# Patient Record
Sex: Female | Born: 1960 | Race: White | Hispanic: No | Marital: Married | Smoking: Former smoker
Health system: Southern US, Community
[De-identification: ages and names within clinical notes are randomized; demographics above are authoritative.]

## PROBLEM LIST (undated history)

## (undated) DIAGNOSIS — L501 Idiopathic urticaria: Secondary | ICD-10-CM

## (undated) DIAGNOSIS — T8859XA Other complications of anesthesia, initial encounter: Secondary | ICD-10-CM

## (undated) DIAGNOSIS — Z90710 Acquired absence of both cervix and uterus: Secondary | ICD-10-CM

## (undated) DIAGNOSIS — I2510 Atherosclerotic heart disease of native coronary artery without angina pectoris: Secondary | ICD-10-CM

## (undated) DIAGNOSIS — J449 Chronic obstructive pulmonary disease, unspecified: Secondary | ICD-10-CM

## (undated) DIAGNOSIS — E785 Hyperlipidemia, unspecified: Secondary | ICD-10-CM

## (undated) DIAGNOSIS — Z800 Family history of malignant neoplasm of digestive organs: Secondary | ICD-10-CM

## (undated) DIAGNOSIS — Z9889 Other specified postprocedural states: Secondary | ICD-10-CM

## (undated) DIAGNOSIS — E7800 Pure hypercholesterolemia, unspecified: Secondary | ICD-10-CM

## (undated) DIAGNOSIS — L281 Prurigo nodularis: Secondary | ICD-10-CM

## (undated) DIAGNOSIS — IMO0001 Reserved for inherently not codable concepts without codable children: Secondary | ICD-10-CM

## (undated) DIAGNOSIS — J701 Chronic and other pulmonary manifestations due to radiation: Secondary | ICD-10-CM

## (undated) DIAGNOSIS — C801 Malignant (primary) neoplasm, unspecified: Secondary | ICD-10-CM

## (undated) DIAGNOSIS — M1990 Unspecified osteoarthritis, unspecified site: Secondary | ICD-10-CM

## (undated) DIAGNOSIS — R112 Nausea with vomiting, unspecified: Secondary | ICD-10-CM

## (undated) DIAGNOSIS — Z9851 Tubal ligation status: Secondary | ICD-10-CM

## (undated) DIAGNOSIS — T4145XA Adverse effect of unspecified anesthetic, initial encounter: Secondary | ICD-10-CM

## (undated) DIAGNOSIS — T783XXA Angioneurotic edema, initial encounter: Secondary | ICD-10-CM

## (undated) DIAGNOSIS — K921 Melena: Secondary | ICD-10-CM

## (undated) DIAGNOSIS — Z01810 Encounter for preprocedural cardiovascular examination: Secondary | ICD-10-CM

## (undated) DIAGNOSIS — I25119 Atherosclerotic heart disease of native coronary artery with unspecified angina pectoris: Secondary | ICD-10-CM

## (undated) DIAGNOSIS — Z902 Acquired absence of lung [part of]: Secondary | ICD-10-CM

## (undated) DIAGNOSIS — C3490 Malignant neoplasm of unspecified part of unspecified bronchus or lung: Secondary | ICD-10-CM

## (undated) DIAGNOSIS — J189 Pneumonia, unspecified organism: Secondary | ICD-10-CM

## (undated) DIAGNOSIS — I219 Acute myocardial infarction, unspecified: Secondary | ICD-10-CM

## (undated) HISTORY — PX: TUBAL LIGATION: SHX77

## (undated) HISTORY — PX: MOUTH SURGERY: SHX715

## (undated) HISTORY — DX: Idiopathic urticaria: L50.1

## (undated) HISTORY — DX: Hyperlipidemia, unspecified: E78.5

## (undated) HISTORY — DX: Acquired absence of both cervix and uterus: Z90.710

## (undated) HISTORY — DX: Atherosclerotic heart disease of native coronary artery with unspecified angina pectoris: I25.119

## (undated) HISTORY — DX: Atherosclerotic heart disease of native coronary artery without angina pectoris: I25.10

## (undated) HISTORY — DX: Tubal ligation status: Z98.51

## (undated) HISTORY — DX: Encounter for preprocedural cardiovascular examination: Z01.810

## (undated) HISTORY — DX: Melena: K92.1

## (undated) HISTORY — DX: Chronic and other pulmonary manifestations due to radiation: J70.1

## (undated) HISTORY — DX: Family history of malignant neoplasm of digestive organs: Z80.0

## (undated) HISTORY — PX: CARDIAC CATHETERIZATION: SHX172

## (undated) HISTORY — DX: Angioneurotic edema, initial encounter: T78.3XXA

## (undated) HISTORY — DX: Chronic obstructive pulmonary disease, unspecified: J44.9

## (undated) HISTORY — PX: APPENDECTOMY: SHX54

## (undated) HISTORY — DX: Acquired absence of lung (part of): Z90.2

## (undated) HISTORY — DX: Prurigo nodularis: L28.1

## (undated) HISTORY — DX: Pure hypercholesterolemia, unspecified: E78.00

## (undated) HISTORY — DX: Malignant neoplasm of unspecified part of unspecified bronchus or lung: C34.90

---

## 2008-07-16 DIAGNOSIS — I219 Acute myocardial infarction, unspecified: Secondary | ICD-10-CM

## 2008-07-16 HISTORY — DX: Acute myocardial infarction, unspecified: I21.9

## 2009-04-19 ENCOUNTER — Ambulatory Visit: Payer: Self-pay | Admitting: Cardiology

## 2009-04-19 ENCOUNTER — Inpatient Hospital Stay (HOSPITAL_COMMUNITY): Admission: EM | Admit: 2009-04-19 | Discharge: 2009-04-22 | Payer: Self-pay | Admitting: Emergency Medicine

## 2009-04-20 ENCOUNTER — Encounter: Payer: Self-pay | Admitting: Cardiology

## 2009-04-25 ENCOUNTER — Encounter (INDEPENDENT_AMBULATORY_CARE_PROVIDER_SITE_OTHER): Payer: Self-pay | Admitting: Nurse Practitioner

## 2009-05-17 ENCOUNTER — Ambulatory Visit (HOSPITAL_COMMUNITY): Admission: RE | Admit: 2009-05-17 | Discharge: 2009-05-17 | Payer: Self-pay | Admitting: Cardiovascular Disease

## 2009-05-17 ENCOUNTER — Ambulatory Visit: Payer: Self-pay | Admitting: Cardiovascular Disease

## 2009-05-17 ENCOUNTER — Encounter: Payer: Self-pay | Admitting: Cardiovascular Disease

## 2010-10-19 LAB — DIFFERENTIAL
Basophils Absolute: 0.2 10*3/uL — ABNORMAL HIGH (ref 0.0–0.1)
Lymphs Abs: 3.6 10*3/uL (ref 0.7–4.0)

## 2010-10-19 LAB — CBC
HCT: 36.5 % (ref 36.0–46.0)
HCT: 42.5 % (ref 36.0–46.0)
Hemoglobin: 12.4 g/dL (ref 12.0–15.0)
Hemoglobin: 12.6 g/dL (ref 12.0–15.0)
MCHC: 34.3 g/dL (ref 30.0–36.0)
MCV: 90.7 fL (ref 78.0–100.0)
MCV: 90.7 fL (ref 78.0–100.0)
Platelets: 228 10*3/uL (ref 150–400)
Platelets: 344 10*3/uL (ref 150–400)
RBC: 4.03 MIL/uL (ref 3.87–5.11)
RBC: 4.1 MIL/uL (ref 3.87–5.11)
RBC: 4.11 MIL/uL (ref 3.87–5.11)
RBC: 4.73 MIL/uL (ref 3.87–5.11)
RDW: 12.6 % (ref 11.5–15.5)
RDW: 13.1 % (ref 11.5–15.5)
RDW: 13.1 % (ref 11.5–15.5)
WBC: 10.1 10*3/uL (ref 4.0–10.5)
WBC: 8.6 10*3/uL (ref 4.0–10.5)

## 2010-10-19 LAB — BASIC METABOLIC PANEL
BUN: 12 mg/dL (ref 6–23)
BUN: 16 mg/dL (ref 6–23)
BUN: 8 mg/dL (ref 6–23)
Calcium: 8.5 mg/dL (ref 8.4–10.5)
Calcium: 9 mg/dL (ref 8.4–10.5)
Calcium: 9.1 mg/dL (ref 8.4–10.5)
Chloride: 107 mEq/L (ref 96–112)
Creatinine, Ser: 0.56 mg/dL (ref 0.4–1.2)
Creatinine, Ser: 0.61 mg/dL (ref 0.4–1.2)
GFR calc Af Amer: >60 mL/min (ref >60)
GFR calc non Af Amer: >60 mL/min (ref >60)
Potassium: 4.3 mEq/L (ref 3.5–5.1)
Potassium: 5 mEq/L (ref 3.5–5.1)
Sodium: 139 mEq/L (ref 135–145)
Sodium: 140 mEq/L (ref 135–145)

## 2010-10-19 LAB — TSH: TSH: 1.539 u[IU]/mL (ref 0.350–4.500)

## 2010-10-19 LAB — LIPASE, BLOOD: Lipase: 35 U/L (ref 11–59)

## 2010-10-19 LAB — COMPREHENSIVE METABOLIC PANEL
ALT: 9 U/L (ref 0–35)
AST: 16 U/L (ref 0–37)
AST: 28 U/L (ref 0–37)
Albumin: 3.4 g/dL — ABNORMAL LOW (ref 3.5–5.2)
BUN: 11 mg/dL (ref 6–23)
CO2: 26 mEq/L (ref 19–32)
Calcium: 8.4 mg/dL (ref 8.4–10.5)
Calcium: 9.6 mg/dL (ref 8.4–10.5)
Chloride: 106 mEq/L (ref 96–112)
Creatinine, Ser: 0.47 mg/dL (ref 0.4–1.2)
GFR calc Af Amer: >60 mL/min (ref >60)
GFR calc Af Amer: >60 mL/min (ref >60)
GFR calc non Af Amer: >60 mL/min (ref >60)
GFR calc non Af Amer: >60 mL/min (ref >60)
Glucose, Bld: 152 mg/dL — ABNORMAL HIGH (ref 70–99)
Potassium: 3 mEq/L — ABNORMAL LOW (ref 3.5–5.1)
Potassium: 4 mEq/L (ref 3.5–5.1)
Sodium: 137 mEq/L (ref 135–145)
Sodium: 140 mEq/L (ref 135–145)
Total Bilirubin: 0.7 mg/dL (ref 0.3–1.2)
Total Protein: 5.3 g/dL — ABNORMAL LOW (ref 6.0–8.3)
Total Protein: 6.6 g/dL (ref 6.0–8.3)

## 2010-10-19 LAB — LIPID PANEL
LDL Cholesterol: 131 mg/dL — ABNORMAL HIGH (ref 0–99)
Triglycerides: 116 mg/dL (ref <150)

## 2010-10-19 LAB — CARDIAC PANEL(CRET KIN+CKTOT+MB+TROPI)
CK, MB: 185.8 ng/mL — ABNORMAL HIGH (ref 0.3–4.0)
Relative Index: 15.9 — ABNORMAL HIGH (ref 0.0–2.5)
Total CK: 1918 U/L — ABNORMAL HIGH (ref 7–177)
Total CK: 278 U/L — ABNORMAL HIGH (ref 7–177)
Troponin I: 1.52 ng/mL (ref 0.00–0.06)

## 2010-10-19 LAB — POCT CARDIAC MARKERS
CKMB, poc: 1.8 ng/mL (ref 1.0–8.0)
Myoglobin, poc: 103 ng/mL (ref 12–200)

## 2010-10-19 LAB — PROTIME-INR: Prothrombin Time: 12.5 seconds (ref 11.6–15.2)

## 2013-01-30 ENCOUNTER — Ambulatory Visit: Payer: Self-pay

## 2014-11-15 ENCOUNTER — Encounter: Payer: Self-pay | Admitting: *Deleted

## 2014-11-15 ENCOUNTER — Encounter: Payer: Self-pay | Admitting: Cardiothoracic Surgery

## 2014-11-15 ENCOUNTER — Institutional Professional Consult (permissible substitution) (INDEPENDENT_AMBULATORY_CARE_PROVIDER_SITE_OTHER): Payer: Medicaid Other | Admitting: Cardiothoracic Surgery

## 2014-11-15 VITALS — BP 105/73 | HR 90 | Resp 16 | Ht 62.0 in | Wt 98.0 lb

## 2014-11-15 DIAGNOSIS — C3411 Malignant neoplasm of upper lobe, right bronchus or lung: Secondary | ICD-10-CM

## 2014-11-15 DIAGNOSIS — Z90710 Acquired absence of both cervix and uterus: Secondary | ICD-10-CM | POA: Insufficient documentation

## 2014-11-15 DIAGNOSIS — Z9851 Tubal ligation status: Secondary | ICD-10-CM | POA: Insufficient documentation

## 2014-11-15 DIAGNOSIS — C3431 Malignant neoplasm of lower lobe, right bronchus or lung: Secondary | ICD-10-CM

## 2014-11-15 DIAGNOSIS — I25119 Atherosclerotic heart disease of native coronary artery with unspecified angina pectoris: Secondary | ICD-10-CM | POA: Insufficient documentation

## 2014-11-15 DIAGNOSIS — J449 Chronic obstructive pulmonary disease, unspecified: Secondary | ICD-10-CM | POA: Insufficient documentation

## 2014-11-15 DIAGNOSIS — E785 Hyperlipidemia, unspecified: Secondary | ICD-10-CM | POA: Insufficient documentation

## 2014-11-15 DIAGNOSIS — C3490 Malignant neoplasm of unspecified part of unspecified bronchus or lung: Secondary | ICD-10-CM | POA: Insufficient documentation

## 2014-11-15 HISTORY — DX: Malignant neoplasm of unspecified part of unspecified bronchus or lung: C34.90

## 2014-11-15 NOTE — Progress Notes (Addendum)
[address]       [place] [address]             [phone]                    Maleeya Barnhill [place] Medical Record #[id number] Date of Birth: [birth year]  Referring: Marice Potter, MD Primary Care: Charlotte Sanes, MD  Chief Complaint:    Chief Complaint  Patient presents with  . Lung Cancer    right upper lobe..eval and treat.Marland KitchenMarland KitchenCTA CHEST and  PET @ [place], BX done    History of Present Illness:    Michelle King 54 y.o. 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 Patient notes cough for 6 months, then developed hemptysis about one month ago. CT and PET and MRI of brain done at [facility]. Patient is long term smoker with significant copd. She has history "massive " Mi in 2010 treated with coronary stent at [facility] hospital by Dr Burt Knack. Currently followed by Dr Bettina Gavia.       Current Activity/ Functional Status:  Patient is independent with mobility/ambulation, transfers, ADL's, IADL's.   Zubrod Score: At the time of surgery this patient's most appropriate activity status/level should be described as: '[]'$     0    Normal activity, no symptoms '[x]'$     1    Restricted in physical strenuous activity but ambulatory, able to do out light work '[]'$     2    Ambulatory and capable of self care, unable to do work activities, up and about               >50 % of waking hours                              '[]'$     3    Only limited self care, in bed greater than 50% of waking hours '[]'$     4    Completely disabled, no self care, confined to bed or chair '[]'$     5    Moribund   Past Medical History  Diagnosis Date  . CAD (coronary artery disease)   . COPD (chronic obstructive pulmonary disease)   . Hyperlipidemia   . S/P hysterectomy     Past Surgical History  Procedure Laterality Date  . Appendectomy    . Tubal ligation    . Mouth surgery      Family History  Problem Relation Age of Onset  . Diabetes Mother     . Cancer Father     colon  . Stroke Sister   . Cancer Brother     colon cancer  . Stroke Sister   . Cancer Sister     lung cancer  . Heart disease Brother   . Cerebrovascular Disease Brother   . Heart disease Brother   . Cerebrovascular Disease Brother     History   Social History  . Marital Status: Married    Spouse Name: N/A  . Number of Children: N/A  . Years of Education: N/A   Occupational History  . Not on file.   Social History Main Topics  . Smoking status: Current Every Day Smoker -- 1.00 packs/day for 40 years    Types: Cigarettes  . Smokeless tobacco: Never Used  . Alcohol Use: No  . Drug Use: Not on file  .  Sexual Activity: Not on file      History  Smoking status  . Current Every Day Smoker -- 1.00 packs/day for 40 years  . Types: Cigarettes  Smokeless tobacco  . Never Used    History  Alcohol Use No     Allergies  Allergen Reactions  . Codeine Itching    FACIAL SWELLING    Current Outpatient Prescriptions  Medication Sig Dispense Refill  . albuterol (PROVENTIL HFA;VENTOLIN HFA) 108 (90 BASE) MCG/ACT inhaler Inhale 2 puffs into the lungs every 6 (six) hours as needed for wheezing or shortness of breath.    . Alirocumab 75 MG/ML SOPN Inject 1 mL into the skin.    Marland Kitchen ALPRAZolam (XANAX) 0.5 MG tablet Take 0.5 mg by mouth 3 (three) times daily as needed for anxiety.    Marland Kitchen aspirin EC 81 MG tablet Take 81 mg by mouth daily.    . clopidogrel (PLAVIX) 75 MG tablet Take 75 mg by mouth daily.    . Fluticasone Furoate 200 MCG/ACT AEPB Inhale 200 mcg into the lungs.    . GUAIFENESIN PO Take 400 mg by mouth every 12 (twelve) hours.    Marland Kitchen ipratropium (ATROVENT) 0.02 % nebulizer solution Take 0.5 mg by nebulization 4 (four) times daily.     No current facility-administered medications for this visit.      Review of Systems:     Cardiac Review of Systems: Y or N  Chest Pain [  y  ]  Resting SOB Blue.Reese   ] Exertional SOB  [ y ]  Orthopnea [ y ]   Pedal  Edema [ y  ]    Palpitations Blue.Reese  ] Syncope  [  ]   Presyncope [ y  ]  General Review of Systems: [Y] = yes [  ]=no Constitional: recent weight change [  ];  Wt loss over the last 3 months [   ] anorexia [  ]; fatigue [ y ]; nausea [  ]; night sweats [  ]; fever [  ]; or chills [  ];          Dental: poor dentition[  ]; Last Dentist visit:   Eye : blurred vision [  ]; diplopia [   ]; vision changes [  ];  Amaurosis fugax[  ]; Resp: cough [  ];  wheezing[y  ];  hemoptysis[ y ]; shortness of breath[ yy ]; paroxysmal nocturnal dyspnea[ y ]; dyspnea on exertion[  y]; or orthopnea[  ];  GI:  gallstones[  ], vomiting[  ];  dysphagia[  ]; melena[  ];  hematochezia [  ]; heartburn[  ];   Hx of  Colonoscopy[  ]; GU: kidney stones [  ]; hematuria[  ];   dysuria [  ];  nocturia[  ];  history of     obstruction [  ]; urinary frequency [  ]             Skin: rash, swelling[  ];, hair loss[  ];  peripheral edema[  ];  or itching[  ]; Musculosketetal: myalgias[  ];  joint swelling[  ];  joint erythema[  ];  joint pain[  ];  back pain[  ];  Heme/Lymph: bruising[  ];  bleeding[  ];  anemia[  ];  Neuro: TIA[  ];  headaches[  ];  stroke[  ];  vertigo[  ];  seizures[  ];   paresthesias[  ];  difficulty walking[  ];  Psych:depression[ y ];  anxiety[y  ];  Endocrine: diabetes[ n ];  thyroid dysfunction[n  ];  Immunizations: Flu up to date [?  ]; Pneumococcal up to date [?  ];  Other:  Physical Exam: BP 105/73 mmHg  Pulse 90  Resp 16  Ht '5\' 2"'$  (2.725 m)  Wt 98 lb (44.453 kg)  BMI 17.92 kg/m2  SpO2 95%  PHYSICAL EXAMINATION: General appearance: alert, cooperative, appears older than stated age, cachectic and no distress Head: Normocephalic, without obvious abnormality, atraumatic Neck: no adenopathy, no carotid bruit, no JVD, supple, symmetrical, trachea midline and thyroid not enlarged, symmetric, no tenderness/mass/nodules Lymph nodes: Cervical, supraclavicular, and axillary nodes normal. Resp: clear to  auscultation bilaterally Back: symmetric, no curvature. ROM normal. No CVA tenderness. Cardio: regular rate and rhythm, S1, S2 normal, no murmur, click, rub or gallop GI: soft, non-tender; bowel sounds normal; no masses,  no organomegaly Extremities: extremities normal, atraumatic, no cyanosis or edema Neurologic: Grossly normal pic line in right upper arm  Diagnostic Studies & Laboratory data:     Recent Radiology Findings:  CT AND PET [facility] AND MRI I have independently reviewed the above radiology studies  and reviewed the findings with the patient.  4.1 CM  RIGHT UPPER MASS with contiguous adenopathy  PET scan suggests n2 nodes.   Recent Lab Findings: Lab Results  Component Value Date   WBC 7.9 04/22/2009   HGB 12.8 04/22/2009   HCT 37.2 04/22/2009   PLT 236 04/22/2009   GLUCOSE 97 04/22/2009   CHOL  04/19/2009    190        ATP III CLASSIFICATION:  <200     mg/dL   Desirable  200-239  mg/dL   Borderline High  >=240    mg/dL   High        SLIGHT HEMOLYSIS   TRIG 116 SLIGHT HEMOLYSIS 04/19/2009   HDL 36 SLIGHT HEMOLYSIS* 04/19/2009   LDLCALC * 04/19/2009    131        Total Cholesterol/HDL:CHD Risk Coronary Heart Disease Risk Table                     Men   Women  1/2 Average Risk   3.4   3.3  Average Risk       5.0   4.4  2 X Average Risk   9.6   7.1  3 X Average Risk  23.4   11.0        Use the calculated Patient Ratio above and the CHD Risk Table to determine the patient's CHD Risk.        ATP III CLASSIFICATION (LDL):  <100     mg/dL   Optimal  100-129  mg/dL   Near or Above                    Optimal  130-159  mg/dL   Borderline  160-189  mg/dL   High  >190     mg/dL   Very High   ALT 9 04/19/2009   AST 28 04/19/2009   NA 139 04/22/2009   K 4.3 04/22/2009   CL 103 04/22/2009   CREATININE 0.61 04/22/2009   BUN 16 04/22/2009   CO2 28 04/22/2009   TSH 1.539 Test methodology is 3rd generation TSH 04/19/2009   INR 0.94 04/19/2009       Assessment / Plan:   clinical stage non small cell cancer adeno favored adeno  [facility]ology [id number] clinical T2N2Mo, Stage IIIa  significant copd current smoker- 10 min discussed with patent need and means to stop smoking before considering surgery History of cad with MI and stenting 2010 will need cardiology clearance before consideration of surgical resection  Case has been presented at [facility] multi disciplinary thoracic oncology conference. Discussion supported plan with Dr Bobby Rumpf to proceed with 4 cycles of chemo and then repeat stage and consider surgical resection, Before resection will need assessment of CAD and lung function    I will see back in 8-10 weeks and coordinate with Dr Bobby Rumpf repeat restaging scans and consider surgical resection if findings are favorable and lung function and cardiac risk is low.  I  spent 55 minutes counseling the patient face to face and 50% or more the  time was spent in counseling and coordination of care. The total time spent in the appointment was 80 minutes.  Grace Isaac MD      [address] [place] [address] Office [phone]   [place]  11/15/2014 8:44 PM

## 2014-11-18 ENCOUNTER — Other Ambulatory Visit: Payer: Self-pay | Admitting: *Deleted

## 2015-01-27 ENCOUNTER — Ambulatory Visit: Payer: PRIVATE HEALTH INSURANCE | Admitting: Cardiothoracic Surgery

## 2015-02-17 ENCOUNTER — Ambulatory Visit: Payer: Medicaid Other | Admitting: Cardiothoracic Surgery

## 2015-02-18 ENCOUNTER — Ambulatory Visit: Payer: Medicaid Other | Admitting: Cardiothoracic Surgery

## 2015-02-24 ENCOUNTER — Ambulatory Visit (INDEPENDENT_AMBULATORY_CARE_PROVIDER_SITE_OTHER): Payer: Medicaid Other | Admitting: Cardiothoracic Surgery

## 2015-02-24 ENCOUNTER — Ambulatory Visit: Payer: Medicaid Other | Admitting: Cardiothoracic Surgery

## 2015-02-24 ENCOUNTER — Encounter: Payer: Self-pay | Admitting: Cardiothoracic Surgery

## 2015-02-24 ENCOUNTER — Other Ambulatory Visit: Payer: Self-pay | Admitting: *Deleted

## 2015-02-24 VITALS — BP 121/81 | HR 85 | Resp 20 | Ht 62.0 in | Wt 104.0 lb

## 2015-02-24 DIAGNOSIS — C3411 Malignant neoplasm of upper lobe, right bronchus or lung: Secondary | ICD-10-CM

## 2015-02-24 NOTE — Progress Notes (Signed)
[address]       [place] [address]             [phone]                    [name] [place] Medical Record #[id number] Date of Birth: 09/22/1960  Referring: Marice Potter, MD Primary Care: Charlotte Sanes, MD  Chief Complaint:    Chief Complaint  Patient presents with  . Lung Cancer    10 week f/u, completed chemo (4) cycles,Chest CT 02/10/15     History of Present Illness:    Michelle King 54 y.o. 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 Patient had notedcough for 6 months, then developed hemptysis in April 2016 . CT and PET and MRI of brain done at [facility]. Patient is long term smoker with significant copd. She is still smoking but decreased to 4-5 cig per day.She has history "massive " Mi in 2010 treated with coronary stent at [facility] hospital by Dr Burt Knack. Currently followed by Dr Bettina Gavia.   She has completed course of chem and repeat ct of chest  7/28    Current Activity/ Functional Status:  Patient is independent with mobility/ambulation, transfers, ADL's, IADL's.   Zubrod Score: At the time of surgery this patient's most appropriate activity status/level should be described as: '[]'$     0    Normal activity, no symptoms '[x]'$     1    Restricted in physical strenuous activity but ambulatory, able to do out light work '[]'$     2    Ambulatory and capable of self care, unable to do work activities, up and about               >50 % of waking hours                              '[]'$     3    Only limited self care, in bed greater than 50% of waking hours '[]'$     4    Completely disabled, no self care, confined to bed or chair '[]'$     5    Moribund   Past Medical History  Diagnosis Date  . CAD (coronary artery disease)   . COPD (chronic obstructive pulmonary disease)   . Hyperlipidemia   . S/P hysterectomy      Past Surgical History  Procedure Laterality Date  . Appendectomy    . Tubal ligation    . Mouth surgery      Family History  Problem Relation Age of Onset  . Diabetes Mother   . Cancer Father     colon  . Stroke Sister   . Cancer Brother     colon cancer  . Stroke Sister   . Cancer Sister     lung cancer  . Heart disease Brother   . Cerebrovascular Disease Brother   . Heart disease Brother   . Cerebrovascular Disease Brother     History   Social History  . Marital Status: Married    Spouse Name: N/A  . Number of Children: N/A  . Years of Education: N/A   Occupational History  . Not on file.   Social History Main Topics  . Smoking status: Current  Every Day Smoker -- 1.00 packs/day for 40 years    Types: Cigarettes  . Smokeless tobacco: Never Used  . Alcohol Use: No  . Drug Use: Not on file  . Sexual Activity: Not on file      History  Smoking status  . Current Every Day Smoker -- 1.00 packs/day for 40 years  . Types: Cigarettes  Smokeless tobacco  . Never Used    History  Alcohol Use No     Allergies  Allergen Reactions  . Codeine Itching    FACIAL SWELLING    Current Outpatient Prescriptions  Medication Sig Dispense Refill  . albuterol (PROVENTIL HFA;VENTOLIN HFA) 108 (90 BASE) MCG/ACT inhaler Inhale 2 puffs into the lungs every 6 (six) hours as needed for wheezing or shortness of breath.    . Alirocumab 75 MG/ML SOPN Inject 1 mL into the skin.    Marland Kitchen ALPRAZolam (XANAX) 0.5 MG tablet Take 0.5 mg by mouth 3 (three) times daily as needed for anxiety.    Marland Kitchen aspirin EC 81 MG tablet Take 81 mg by mouth daily.    . clopidogrel (PLAVIX) 75 MG tablet Take 75 mg by mouth daily.    . Fluticasone Furoate 200 MCG/ACT AEPB Inhale 200 mcg into the lungs.    . GUAIFENESIN PO Take 400 mg by mouth every 12 (twelve) hours.    Marland Kitchen ipratropium (ATROVENT) 0.02 % nebulizer solution Take 0.5 mg by nebulization 4 (four) times daily.     No current  facility-administered medications for this visit.      Review of Systems:     Cardiac Review of Systems: Y or N  Chest Pain [  y  ]  Resting SOB Blue.Reese   ] Exertional SOB  [ y ]  Orthopnea [ y ]   Pedal Edema [ y  ]    Palpitations Blue.Reese  ] Syncope  [  ]   Presyncope [ y  ]  General Review of Systems: [Y] = yes [  ]=no Constitional: recent weight change [  ];  Wt loss over the last 3 months [   ] anorexia [  ]; fatigue [ y ]; nausea [  ]; night sweats [  ]; fever [  ]; or chills [  ];          Dental: poor dentition[  ]; Last Dentist visit:   Eye : blurred vision [  ]; diplopia [   ]; vision changes [  ];  Amaurosis fugax[  ]; Resp: cough [  ];  wheezing[y  ];  hemoptysis[ y ]; shortness of breath[ yy ]; paroxysmal nocturnal dyspnea[ y ]; dyspnea on exertion[  y]; or orthopnea[  ];  GI:  gallstones[  ], vomiting[  ];  dysphagia[  ]; melena[  ];  hematochezia [  ]; heartburn[  ];   Hx of  Colonoscopy[  ]; GU: kidney stones [  ]; hematuria[  ];   dysuria [  ];  nocturia[  ];  history of     obstruction [  ]; urinary frequency [  ]             Skin: rash, swelling[  ];, hair loss[  ];  peripheral edema[  ];  or itching[  ]; Musculosketetal: myalgias[  ];  joint swelling[  ];  joint erythema[  ];  joint pain[  ];  back pain[  ];  Heme/Lymph: bruising[  ];  bleeding[  ];  anemia[  ];  Neuro: TIA[  ];  headaches[  ];  stroke[  ];  vertigo[  ];  seizures[  ];   paresthesias[  ];  difficulty walking[  ];  Psych:depression[ y ]; Michelle King  ];  Endocrine: diabetes[ n ];  thyroid dysfunction[n  ];  Immunizations: Flu up to date [?  ]; Pneumococcal up to date [?  ];  Other:  Physical Exam: BP 121/81 mmHg  Pulse 85  Resp 20  Ht '5\' 2"'$  (1.575 m)  Wt 104 lb (47.174 kg)  BMI 19.02 kg/m2  SpO2 97%  PHYSICAL EXAMINATION: General appearance: alert, cooperative, appears older than stated age, cachectic and no distress Head: Normocephalic, without obvious abnormality, atraumatic Neck: no adenopathy, no  carotid bruit, no JVD, supple, symmetrical, trachea midline and thyroid not enlarged, symmetric, no tenderness/mass/nodules Lymph nodes: Cervical, supraclavicular, and axillary nodes normal. Resp: clear to auscultation bilaterally Back: symmetric, no curvature. ROM normal. No CVA tenderness. Cardio: regular rate and rhythm, S1, S2 normal, no murmur, click, rub or gallop GI: soft, non-tender; bowel sounds normal; no masses,  no organomegaly Extremities: extremities normal, atraumatic, no cyanosis or edema Neurologic: Grossly normal   Diagnostic Studies & Laboratory data:     Recent Radiology Findings:  CLINICAL DATA: Malignant neoplasm of the right lung.  EXAM: CT CHEST WITH CONTRAST  TECHNIQUE: Multidetector CT imaging of the chest was performed during intravenous contrast administration.  CONTRAST: 80 cc of Isovue 370  COMPARISON: 10/16/2014  FINDINGS: Mediastinum: The heart size appears normal. No pericardial effusion. Aortic atherosclerosis. There is no enlarged mediastinal or hilar lymph nodes. The trachea appears patent and is midline. Normal appearance of the esophagus. No supraclavicular or axillary adenopathy identified.  Lungs/Pleura: No pleural effusion identified. Moderate changes of centrilobular emphysema. Right upper lobe lung mass measures 3.1 cm, image 20/series 4. Previously 4.7 cm. No new or enlarging pulmonary nodules or masses.  Upper Abdomen: No suspicious liver abnormality. The visualized portions of the spleen are unremarkable. The adrenal glands are both normal. The visualized portions of the kidneys and pancreas are also normal.  Musculoskeletal: Review of the visualized bony structures is negative for aggressive lytic or sclerotic bone lesion.  IMPRESSION: 1. Interval decrease in size of right lung mass. 2. Emphysema 3. Aortic atherosclerosis.   Electronically Signed By: Kerby Moors M.D. On: 02/10/2015 09:47  I have independently  reviewed the above radiology studies  and reviewed the findings with the patient.     CT AND PET [facility] AND MRI   4.1 CM  RIGHT UPPER MASS with contiguous adenopathy  PET scan suggests n2 nodes.   Recent Lab Findings: Lab Results  Component Value Date   WBC 7.9 04/22/2009   HGB 12.8 04/22/2009   HCT 37.2 04/22/2009   PLT 236 04/22/2009   GLUCOSE 97 04/22/2009   CHOL  04/19/2009    190        ATP III CLASSIFICATION:  <200     mg/dL   Desirable  200-239  mg/dL   Borderline High  >=240    mg/dL   High        SLIGHT HEMOLYSIS   TRIG 116 SLIGHT HEMOLYSIS 04/19/2009   HDL 36 SLIGHT HEMOLYSIS* 04/19/2009   LDLCALC * 04/19/2009    131        Total Cholesterol/HDL:CHD Risk Coronary Heart Disease Risk Table                     Men   Women  1/2 Average Risk  3.4   3.3  Average Risk       5.0   4.4  2 X Average Risk   9.6   7.1  3 X Average Risk  23.4   11.0        Use the calculated Patient Ratio above and the CHD Risk Table to determine the patient's CHD Risk.        ATP III CLASSIFICATION (LDL):  <100     mg/dL   Optimal  100-129  mg/dL   Near or Above                    Optimal  130-159  mg/dL   Borderline  160-189  mg/dL   High  >190     mg/dL   Very High   ALT 9 04/19/2009   AST 28 04/19/2009   NA 139 04/22/2009   K 4.3 04/22/2009   CL 103 04/22/2009   CREATININE 0.61 04/22/2009   BUN 16 04/22/2009   CO2 28 04/22/2009   TSH 1.539 Test methodology is 3rd generation TSH 04/19/2009   INR 0.94 04/19/2009      Assessment / Plan:   clinical stage IIIa (or poss Stage II) non small cell cancer adeno favored adeno  [place] [facility] [phone] clinical T2N2Mo, Ct shows decreased size of mass significant copd current smoker- PFT pending History of cad with MI and stenting 2010 will need cardiology clearance before consideration of surgical resection Will need PFT to evaluate  underlying pulmonary disease  Will see back after pft and cardiology evaluation and  make final decision about resection     Grace Isaac MD      [address] [place] [address] Office [phone]   [place] [phone]  02/24/2015 9:28 AM

## 2015-03-01 DIAGNOSIS — J449 Chronic obstructive pulmonary disease, unspecified: Secondary | ICD-10-CM

## 2015-03-01 DIAGNOSIS — J4489 Other specified chronic obstructive pulmonary disease: Secondary | ICD-10-CM

## 2015-03-01 DIAGNOSIS — E7800 Pure hypercholesterolemia, unspecified: Secondary | ICD-10-CM

## 2015-03-01 HISTORY — DX: Pure hypercholesterolemia, unspecified: E78.00

## 2015-03-01 HISTORY — DX: Other specified chronic obstructive pulmonary disease: J44.89

## 2015-03-01 HISTORY — DX: Chronic obstructive pulmonary disease, unspecified: J44.9

## 2015-03-02 ENCOUNTER — Ambulatory Visit (HOSPITAL_COMMUNITY)
Admission: RE | Admit: 2015-03-02 | Discharge: 2015-03-02 | Disposition: A | Discharge status: Home / Self Care | Payer: Medicaid Other | Source: Ambulatory Visit | Attending: Cardiothoracic Surgery | Admitting: Cardiothoracic Surgery

## 2015-03-02 DIAGNOSIS — C3411 Malignant neoplasm of upper lobe, right bronchus or lung: Secondary | ICD-10-CM

## 2015-03-02 DIAGNOSIS — F17210 Nicotine dependence, cigarettes, uncomplicated: Secondary | ICD-10-CM | POA: Diagnosis not present

## 2015-03-02 DIAGNOSIS — Z01810 Encounter for preprocedural cardiovascular examination: Secondary | ICD-10-CM

## 2015-03-02 HISTORY — DX: Encounter for preprocedural cardiovascular examination: Z01.810

## 2015-03-02 LAB — PULMONARY FUNCTION TEST
DL/VA % pred: 80 %
DL/VA: 3.43 ml/min/mmHg/L
DLCO unc % pred: 74 %
DLCO unc: 14.13 ml/min/mmHg
FEF 25-75 Post: 1.43 L/sec
FEF 25-75 Pre: 1.68 L/sec
FEF2575-%Change-Post: -15 %
FEF2575-%Pred-Post: 75 %
FEF2575-%Pred-Pre: 89 %
FEV1-%Change-Post: -5 %
FEV1-%Pred-Post: 94 %
FEV1-%Pred-Pre: 100 %
FEV1-Post: 1.92 L
FEV1-Pre: 2.04 L
FEV1FVC-%Change-Post: 0 %
FEV1FVC-%Pred-Pre: 96 %
FEV6-%Change-Post: -6 %
FEV6-%Pred-Post: 99 %
FEV6-%Pred-Pre: 106 %
FEV6-Post: 2.55 L
FEV6-Pre: 2.73 L
FEV6FVC-%Pred-Post: 104 %
FEV6FVC-%Pred-Pre: 104 %
FVC-%Change-Post: -6 %
FVC-%Pred-Post: 95 %
FVC-%Pred-Pre: 102 %
FVC-Post: 2.55 L
Post FEV1/FVC ratio: 75 %
Post FEV6/FVC ratio: 100 %
Pre FEV1/FVC ratio: 75 %
Pre FEV6/FVC Ratio: 100 %
RV % pred: 98 %
RV: 1.87 L
TLC % pred: 99 %
TLC: 4.42 L

## 2015-03-02 MED ORDER — ALBUTEROL SULFATE (2.5 MG/3ML) 0.083% IN NEBU
2.5000 mg | INHALATION_SOLUTION | Freq: Once | RESPIRATORY_TRACT | Status: AC
  Administered 2015-03-02: 2.5 mg via RESPIRATORY_TRACT

## 2015-03-09 ENCOUNTER — Encounter: Payer: Self-pay | Admitting: Cardiothoracic Surgery

## 2015-03-09 ENCOUNTER — Encounter: Payer: Self-pay | Admitting: *Deleted

## 2015-03-09 ENCOUNTER — Ambulatory Visit (INDEPENDENT_AMBULATORY_CARE_PROVIDER_SITE_OTHER): Payer: Medicaid Other | Admitting: Cardiothoracic Surgery

## 2015-03-09 VITALS — BP 111/76 | HR 92 | Resp 20 | Ht 62.0 in | Wt 104.0 lb

## 2015-03-09 DIAGNOSIS — C3411 Malignant neoplasm of upper lobe, right bronchus or lung: Secondary | ICD-10-CM

## 2015-03-09 NOTE — Patient Instructions (Signed)
Lung Resection A lung resection is a procedure to remove part or all of a lung. When an entire lung is removed, the procedure is called a pneumonectomy. When only part of a lung is removed, the procedure is called a lobectomy. A lung resection is typically done to get rid of a tumor or cancer, but it may be done to treat other conditions. This procedure can help relieve some or all of your symptoms and can also help keep the problem from getting worse. Lung resection may provide the best chance for curing your disease. However, the procedure may not necessarily cure lung cancer if that is the problem.  LET YOUR HEALTH CARE PROVIDER KNOW ABOUT:   Any allergies you have.  All medicines you are taking, including vitamins, herbs, eye drops, creams, and over-the-counter medicines.  Previous problems you or members of your family have had with the use of anesthetics.  Any blood disorders you have.  Previous surgeries you have had.  Medical conditions you have. RISKS AND COMPLICATIONS  Generally, lung resection is a safe procedure. However, problems can occur and include:  Excessive bleeding.  Infection.  Inability to breathe without a ventilator.  Persistent shortness of breath.  Heart problems, including abnormal rhythms and a risk of heart attack or heart failure.  Blood clots.  Injury to a blood vessel.  Injury to a nerve.  Failure to heal properly.  Stroke.  Bronchopleural fistula. This is a small hole between one of the main breathing tubes (bronchus) and the lining of the lungs. This is rare.  Reaction to anesthesia. BEFORE THE PROCEDURE  You may have tests done before the procedure, including:  Blood tests.  Urine tests.  X-rays.  Other imaging tests (such as CT scans, MRI scans, and PET scans). These tests are done to find the exact size and location of the condition being treated with this surgery.  Pulmonary function tests. These are breathing tests to assess  the function of your lungs before surgery and to decide how to best help your breathing after surgery.  Heart testing. This is done to make sure your heart is strong enough for the procedure.  Bronchoscopy. This is a technique that allows your health care provider to look at the inside of your airways. This is done using a soft, flexible tube (bronchoscope). Along with imaging tests, this can help your health care provider know the exact location and size of the area that will be removed during surgery.  Lymph node sampling. This may need to be done to see if the tumor has spread. It may be done as a separate surgery or right before your lung resection procedure. PROCEDURE  An IV tube will be placed in your arm. You will be given a medicine that makes you fall asleep (general anesthetic). You may also get pain medicine through a thin, flexible tube (catheter) in your back.  A breathing tube will be placed in your throat.  Once the surgical team has prepared you for surgery, your surgeon will make an incision on your side. Some resections are done through large incisions, while others can be done through small incisions using smaller instruments and assisted with small cameras (laparoscopic surgery).  Your surgeon will carefully cut the veins, arteries, and bronchus leading to your lung. After being cut, each of these pieces will be sewn or stapled closed. The lung or part of the lung will then be removed.  Your surgeon will check inside your chest to make   sure there is no bleeding in or around the lungs. Lymph nodes near the lung may also be removed for later tests.  Your surgeon may put tubes into your chest to drain extra fluid and air after surgery.  Your incision will be closed. This may be done using:  Stitches that absorb into your body and do not need to be removed.  Stitches that must be removed.  Staples that must be removed. AFTER THE PROCEDURE   You will be taken to the  recovery area and your progress will be monitored. You may still have a breathing tube and other tubes or catheters in your body immediately after surgery. These will be removed during your recovery. You may be put on a respirator following surgery if some assistance is needed to help your breathing. When you are awake and not experiencing immediate problems from surgery, you will be moved to the intensive care unit (ICU) where you will continue your recovery.  You may feel pain in your chest and throat. Sometimes during recovery, patients may shiver or feel nauseous. You will be given medicine to help with pain and nausea.  The breathing tube will be taken out as soon as your health care providers feel you can breathe on your own. For most people, this happens on the same day as the surgery.  If your surgery and time in the ICU go well, most of the tubes and equipment will be taken out within 1-2 days after surgery. This is about how long most people stay in the ICU. You may need to stay longer, depending on how you are doing.  You should also start respiratory therapy in the ICU. This therapy uses breathing exercises to help your other lung stay healthy and get stronger.  As you improve, you will be moved to a regular hospital room for continued respiratory therapy, help with your bladder and bowels, and to continue medicines.  After your lung or part of your lung is taken out, there will be a space inside your chest. This space will often fill up with fluid over time. The amount of time this takes is different for each person.  You will receive care until you are doing well and your health care provider feels it is safe for you to go home or to transfer to an extended care facility. Document Released: 09/22/2002 Document Revised: 11/16/2013 Document Reviewed: 08/21/2013 ExitCare Patient Information 2015 [employer]. This information is not intended to replace advice given to you by your health  care provider. Make sure you discuss any questions you have with your health care provider.Lung Resection, Care After Refer to this sheet in the next few weeks. These instructions provide you with information on caring for yourself after your procedure. Your health care provider may also give you more specific instructions. Your treatment has been planned according to current medical practices, but problems sometimes occur. Call your health care provider if you have any problems or questions after your procedure. WHAT TO EXPECT AFTER THE PROCEDURE After your procedure, it is typical to have the following:   You may feel pain in your chest and throat.  Patients may sometimes shiver or feel nauseous during recovery. HOME CARE INSTRUCTIONS  You may resume a normal diet and activities as directed by your health care provider.  Do not use any tobacco products, including cigarettes, chewing tobacco, or electronic cigarettes. If you need help quitting, ask your health care provider.  There are many different ways   to close and cover an incision, including stitches, skin glue, and adhesive strips. Follow your health care provider's instructions on:  Incision care.  Bandage (dressing) changes and removal.  Incision closure removal.  Take medicines only as directed by your health care provider.  Keep all follow-up visits as directed by your health care provider. This is important.  Try to breathe deeply and cough as directed. Holding a pillow firmly over your ribs may help with discomfort.  If you were given an incentive spirometer in the hospital, continue to use it as directed by your health care provider.  Walk as directed by your health care provider.  You may take a shower and gently wash the area of your incision with water and soap as directed by your health care provider. Do not use anything else to clean your incision except as directed by your health care provider. Do not take baths,  swim, or use a hot tub until your health care provider approves. SEEK MEDICAL CARE IF:  You notice redness, swelling, or increasing pain at the incision site.  You are bleeding at the incision site.  You see pus coming from the incision site.  You notice a bad smell coming from the incision site or bandage.  Your incision breaks open.  You cough up blood or pus, or you develop a cough that produces bad-smelling sputum.  You have pain or swelling in your legs.  You have increasing pain that is not controlled with medicine.  You have trouble managing any of the tubes that have been left in place after surgery.  You have fever or chills. SEEK IMMEDIATE MEDICAL CARE IF:   You have chest pain or an irregular or rapid heartbeat.  You have dizzy episodes or faint.  You have shortness of breath or difficulty breathing.  You have persistent nausea or vomiting.  You have a rash. Document Released: 01/19/2005 Document Revised: 11/16/2013 Document Reviewed: 08/21/2013 ExitCare Patient Information 2015 [employer]. This information is not intended to replace advice given to you by your health care provider. Make sure you discuss any questions you have with your health care provider.  

## 2015-03-10 ENCOUNTER — Other Ambulatory Visit: Payer: Self-pay | Admitting: *Deleted

## 2015-03-10 DIAGNOSIS — C3401 Malignant neoplasm of right main bronchus: Secondary | ICD-10-CM

## 2015-03-10 NOTE — Progress Notes (Signed)
[address]       [place] [address]             [phone]                    Michelle King [place] Medical Record #[id number] Date of Birth: [birth year]  Referring: Marice Potter, MD Primary Care: Charlotte Sanes, MD  Chief Complaint:    Chief Complaint  Patient presents with  . Lung Cancer    further discuss surgery, Cardiac Clearance- Dr Bettina Gavia, PFT's 03/02/15    History of Present Illness:    Michelle King 54 y.o. 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 Patient had notedcough for 6 months, then developed hemptysis in April 2016 . CT and PET and MRI of brain done at [facility]. Patient is long term smoker with significant copd. She is still smoking but decreased to 4-5 cig per day.She has history "massive " Mi in 2010 treated with coronary stent at [facility] hospital by Dr Burt Knack. Currently followed by Dr Bettina Gavia. She has recently seen Dr. Bettina Gavia and had a repeat echocardiogram done. Dr. Bettina Gavia has sent the associated notes for his visit.  She has completed course of chem and repeat ct of chest  7/28    Current Activity/ Functional Status:  Patient is independent with mobility/ambulation, transfers, ADL's, IADL's.   Zubrod Score: At the time of surgery this patient's most appropriate activity status/level should be described as: '[]'$     0    Normal activity, no symptoms '[x]'$     1    Restricted in physical strenuous activity but ambulatory, able to do out light work '[]'$     2    Ambulatory and capable of self care, unable to do work activities, up and about               >50 % of waking hours                              '[]'$     3    Only limited self care, in bed greater than 50% of waking hours '[]'$     4    Completely disabled, no self care, confined to bed or chair '[]'$     5    Moribund   Past Medical History    Diagnosis Date  . CAD (coronary artery disease)   . COPD (chronic obstructive pulmonary disease)   . Hyperlipidemia   . S/P hysterectomy     Past Surgical History  Procedure Laterality Date  . Appendectomy    . Tubal ligation    . Mouth surgery      Family History  Problem Relation Age of Onset  . Diabetes Mother   . Cancer Father     colon  . Stroke Sister   . Cancer Brother     colon cancer  . Stroke Sister   . Cancer Sister     lung cancer  . Heart disease Brother   . Cerebrovascular Disease Brother   . Heart disease Brother   . Cerebrovascular Disease Brother     History   Social History  . Marital Status: Married    Spouse Name: N/A  . Number of Children: N/A  . Years of Education:  N/A   Occupational History  . Not on file.   Social History Main Topics  . Smoking status: Current Every Day Smoker -- 1.00 packs/day for 40 years    Types: Cigarettes  . Smokeless tobacco: Never Used  . Alcohol Use: No  . Drug Use: Not on file  . Sexual Activity: Not on file      History  Smoking status  . Current Every Day Smoker -- 1.00 packs/day for 40 years  . Types: Cigarettes  Smokeless tobacco  . Never Used    History  Alcohol Use No     Allergies  Allergen Reactions  . Codeine Itching    FACIAL SWELLING  . Rosuvastatin   . Sympathomimetics     Current Outpatient Prescriptions  Medication Sig Dispense Refill  . albuterol (PROVENTIL HFA;VENTOLIN HFA) 108 (90 BASE) MCG/ACT inhaler Inhale 2 puffs into the lungs every 6 (six) hours as needed for wheezing or shortness of breath.    . Alirocumab 75 MG/ML SOPN Inject 1 mL into the skin.    Marland Kitchen ALPRAZolam (XANAX) 0.5 MG tablet Take 0.5 mg by mouth 3 (three) times daily as needed for anxiety.    Marland Kitchen aspirin EC 81 MG tablet Take 81 mg by mouth daily.    . clopidogrel (PLAVIX) 75 MG tablet Take 75 mg by mouth daily.    . Fluticasone Furoate 200 MCG/ACT AEPB Inhale 200 mcg into the lungs.    . GUAIFENESIN PO  Take 400 mg by mouth every 12 (twelve) hours.    Marland Kitchen ipratropium (ATROVENT) 0.02 % nebulizer solution Take 0.5 mg by nebulization 4 (four) times daily.     No current facility-administered medications for this visit.      Review of Systems:     Cardiac Review of Systems: Y or N  Chest Pain [  y  ]  Resting SOB Blue.Reese   ] Exertional SOB  [ y ]  Orthopnea [ y ]   Pedal Edema [ y  ]    Palpitations Blue.Reese  ] Syncope  [  ]   Presyncope [ y  ]  General Review of Systems: [Y] = yes [  ]=no Constitional: recent weight change [  ];  Wt loss over the last 3 months [   ] anorexia [  ]; fatigue [ y ]; nausea [  ]; night sweats [  ]; fever [  ]; or chills [  ];          Dental: poor dentition[  ]; Last Dentist visit:   Eye : blurred vision [  ]; diplopia [   ]; vision changes [  ];  Amaurosis fugax[  ]; Resp: cough [  ];  wheezing[y  ];  hemoptysis[ y ]; shortness of breath[ yy ]; paroxysmal nocturnal dyspnea[ y ]; dyspnea on exertion[  y]; or orthopnea[  ];  GI:  gallstones[  ], vomiting[  ];  dysphagia[  ]; melena[  ];  hematochezia [  ]; heartburn[  ];   Hx of  Colonoscopy[  ]; GU: kidney stones [  ]; hematuria[  ];   dysuria [  ];  nocturia[  ];  history of     obstruction [  ]; urinary frequency [  ]             Skin: rash, swelling[  ];, hair loss[  ];  peripheral edema[  ];  or itching[  ]; Musculosketetal: myalgias[  ];  joint swelling[  ];  joint erythema[  ];  joint pain[  ];  back pain[  ];  Heme/Lymph: bruising[  ];  bleeding[  ];  anemia[  ];  Neuro: TIA[  ];  headaches[  ];  stroke[  ];  vertigo[  ];  seizures[  ];   paresthesias[  ];  difficulty walking[  ];  Psych:depression[ y ]; [name]  ];  Endocrine: diabetes[ n ];  thyroid dysfunction[n  ];  Immunizations: Flu up to date [?  ]; Pneumococcal up to date [?  ];  Other:  Physical Exam: BP 111/76 mmHg  Pulse 92  Resp 20  Ht '5\' 2"'$  (1.575 m)  Wt 104 lb (47.174 kg)  BMI 19.02 kg/m2  SpO2 98%  PHYSICAL EXAMINATION: General appearance:  alert, cooperative, appears older than stated age, cachectic and no distress Head: Normocephalic, without obvious abnormality, atraumatic Neck: no adenopathy, no carotid bruit, no JVD, supple, symmetrical, trachea midline and thyroid not enlarged, symmetric, no tenderness/mass/nodules Lymph nodes: Cervical, supraclavicular, and axillary nodes normal. Resp: clear to auscultation bilaterally Back: symmetric, no curvature. ROM normal. No CVA tenderness. Cardio: regular rate and rhythm, S1, S2 normal, no murmur, click, rub or gallop GI: soft, non-tender; bowel sounds normal; no masses,  no organomegaly Extremities: extremities normal, atraumatic, no cyanosis or edema Neurologic: Grossly normal   Diagnostic Studies & Laboratory data:     Recent Radiology Findings:  CLINICAL DATA: Malignant neoplasm of the right lung.  EXAM: CT CHEST WITH CONTRAST  TECHNIQUE: Multidetector CT imaging of the chest was performed during intravenous contrast administration.  CONTRAST: 80 cc of Isovue 370  COMPARISON: 10/16/2014  FINDINGS: Mediastinum: The heart size appears normal. No pericardial effusion. Aortic atherosclerosis. There is no enlarged mediastinal or hilar lymph nodes. The trachea appears patent and is midline. Normal appearance of the esophagus. No supraclavicular or axillary adenopathy identified.  Lungs/Pleura: No pleural effusion identified. Moderate changes of centrilobular emphysema. Right upper lobe lung mass measures 3.1 cm, image 20/series 4. Previously 4.7 cm. No new or enlarging pulmonary nodules or masses.  Upper Abdomen: No suspicious liver abnormality. The visualized portions of the spleen are unremarkable. The adrenal glands are both normal. The visualized portions of the kidneys and pancreas are also normal.  Musculoskeletal: Review of the visualized bony structures is negative for aggressive lytic or sclerotic bone lesion.  IMPRESSION: 1. Interval decrease in  size of right lung mass. 2. Emphysema 3. Aortic atherosclerosis.   Electronically Signed By: Kerby Moors M.D. On: 02/10/2015 09:47  I have independently reviewed the above radiology studies  and reviewed the findings with the patient.     CT AND PET [place] AND MRI   4.1 CM  RIGHT UPPER MASS with contiguous adenopathy  PET scan suggests n2 nodes.   Recent Lab Findings: Lab Results  Component Value Date   WBC 7.9 04/22/2009   HGB 12.8 04/22/2009   HCT 37.2 04/22/2009   PLT 236 04/22/2009   GLUCOSE 97 04/22/2009   CHOL  04/19/2009    190        ATP III CLASSIFICATION:  <200     mg/dL   Desirable  200-239  mg/dL   Borderline High  >=240    mg/dL   High        SLIGHT HEMOLYSIS   TRIG 116 SLIGHT HEMOLYSIS 04/19/2009   HDL 36 SLIGHT HEMOLYSIS* 04/19/2009   LDLCALC * 04/19/2009    131        Total Cholesterol/HDL:CHD Risk Coronary Heart Disease Risk Table  Men   Women  1/2 Average Risk   3.4   3.3  Average Risk       5.0   4.4  2 X Average Risk   9.6   7.1  3 X Average Risk  23.4   11.0        Use the calculated Patient Ratio above and the CHD Risk Table to determine the patient's CHD Risk.        ATP III CLASSIFICATION (LDL):  <100     mg/dL   Optimal  100-129  mg/dL   Near or Above                    Optimal  130-159  mg/dL   Borderline  160-189  mg/dL   High  >190     mg/dL   Very High   ALT 9 04/19/2009   AST 28 04/19/2009   NA 139 04/22/2009   K 4.3 04/22/2009   CL 103 04/22/2009   CREATININE 0.61 04/22/2009   BUN 16 04/22/2009   CO2 28 04/22/2009   TSH 1.539 Test methodology is 3rd generation TSH 04/19/2009   INR 0.94 04/19/2009   PFT's Pulmonary Function Diagnosis: Minimal Obstructive Airways Disease Insignificant response to bronchodilator Minimal Diffusion Defect FEV1 2.04 100%   DLCO  14.13 74 %   Assessment / Plan:   clinical stage IIIa (or poss Stage II) non small cell cancer adeno favored adeno  [place]  [facility] [phone] clinical T2N2Mo, Ct shows decreased size of mass significant copd current smoker- PFT complete History of cad with MI and stenting 2010 will need cardiology clearance before consideration of surgical resection Cleared by cardiology- Dr Bettina Gavia for surgery  I discussed with the patient and her husband in detail proceeding with surgical resection of her right upper lobe lung cancer, she has had significant decrease in size of the primary mass in addition the associated adenopathy. The patient understands that she does have advanced stage lung cancer but with her young age and without evidence of distant metastasis we will proceed with resection. Risks and options were discussed with her and her husband in detail. Dr. Bettina Gavia has right left recommendations about stopping her Plavix prior to surgery, and has done a updated echocardiogram. Potentially planned surgery September 9.     Grace Isaac MD      [address] [place] [address] Office [phone]   Beeper [phone]  03/10/2015 5:39 PM

## 2015-03-23 ENCOUNTER — Encounter (HOSPITAL_COMMUNITY)
Admission: RE | Admit: 2015-03-23 | Discharge: 2015-03-23 | Disposition: A | Discharge status: Home / Self Care | Payer: Medicaid Other | Source: Ambulatory Visit | Attending: Cardiothoracic Surgery | Admitting: Cardiothoracic Surgery

## 2015-03-23 ENCOUNTER — Encounter: Payer: Self-pay | Admitting: Anatomic Pathology & Clinical Pathology

## 2015-03-23 ENCOUNTER — Encounter (HOSPITAL_COMMUNITY): Payer: Self-pay

## 2015-03-23 VITALS — BP 108/73 | HR 88 | Temp 99.2°F | Resp 20 | Ht 62.0 in | Wt 102.3 lb

## 2015-03-23 DIAGNOSIS — C3491 Malignant neoplasm of unspecified part of right bronchus or lung: Secondary | ICD-10-CM | POA: Insufficient documentation

## 2015-03-23 DIAGNOSIS — Z7902 Long term (current) use of antithrombotics/antiplatelets: Secondary | ICD-10-CM | POA: Insufficient documentation

## 2015-03-23 DIAGNOSIS — J449 Chronic obstructive pulmonary disease, unspecified: Secondary | ICD-10-CM | POA: Insufficient documentation

## 2015-03-23 DIAGNOSIS — Z0183 Encounter for blood typing: Secondary | ICD-10-CM | POA: Insufficient documentation

## 2015-03-23 DIAGNOSIS — Z01818 Encounter for other preprocedural examination: Secondary | ICD-10-CM | POA: Diagnosis present

## 2015-03-23 DIAGNOSIS — E785 Hyperlipidemia, unspecified: Secondary | ICD-10-CM | POA: Insufficient documentation

## 2015-03-23 DIAGNOSIS — C3401 Malignant neoplasm of right main bronchus: Secondary | ICD-10-CM

## 2015-03-23 DIAGNOSIS — Z923 Personal history of irradiation: Secondary | ICD-10-CM | POA: Insufficient documentation

## 2015-03-23 DIAGNOSIS — Z9221 Personal history of antineoplastic chemotherapy: Secondary | ICD-10-CM | POA: Diagnosis not present

## 2015-03-23 DIAGNOSIS — Z7982 Long term (current) use of aspirin: Secondary | ICD-10-CM | POA: Diagnosis not present

## 2015-03-23 DIAGNOSIS — I2510 Atherosclerotic heart disease of native coronary artery without angina pectoris: Secondary | ICD-10-CM | POA: Diagnosis not present

## 2015-03-23 DIAGNOSIS — Z955 Presence of coronary angioplasty implant and graft: Secondary | ICD-10-CM | POA: Diagnosis not present

## 2015-03-23 DIAGNOSIS — Z79899 Other long term (current) drug therapy: Secondary | ICD-10-CM | POA: Insufficient documentation

## 2015-03-23 DIAGNOSIS — I252 Old myocardial infarction: Secondary | ICD-10-CM | POA: Diagnosis not present

## 2015-03-23 DIAGNOSIS — Z01812 Encounter for preprocedural laboratory examination: Secondary | ICD-10-CM | POA: Insufficient documentation

## 2015-03-23 HISTORY — DX: Other complications of anesthesia, initial encounter: T88.59XA

## 2015-03-23 HISTORY — DX: Malignant (primary) neoplasm, unspecified: C80.1

## 2015-03-23 HISTORY — DX: Nausea with vomiting, unspecified: R11.2

## 2015-03-23 HISTORY — DX: Acute myocardial infarction, unspecified: I21.9

## 2015-03-23 HISTORY — DX: Other specified postprocedural states: Z98.890

## 2015-03-23 HISTORY — DX: Adverse effect of unspecified anesthetic, initial encounter: T41.45XA

## 2015-03-23 HISTORY — DX: Pneumonia, unspecified organism: J18.9

## 2015-03-23 HISTORY — DX: Reserved for inherently not codable concepts without codable children: IMO0001

## 2015-03-23 LAB — COMPREHENSIVE METABOLIC PANEL
ALT: 16 U/L (ref 14–54)
AST: 23 U/L (ref 15–41)
Albumin: 4.2 g/dL (ref 3.5–5.0)
Alkaline Phosphatase: 58 U/L (ref 38–126)
Anion gap: 12 (ref 5–15)
BUN: 7 mg/dL (ref 6–20)
CO2: 22 mmol/L (ref 22–32)
Calcium: 9.3 mg/dL (ref 8.9–10.3)
Chloride: 103 mmol/L (ref 101–111)
Creatinine, Ser: 0.51 mg/dL (ref 0.44–1.00)
GFR calc Af Amer: >60 mL/min (ref >60)
GFR calc non Af Amer: >60 mL/min (ref >60)
Glucose, Bld: 100 mg/dL — ABNORMAL HIGH (ref 65–99)
Potassium: 4.3 mmol/L (ref 3.5–5.1)
Sodium: 137 mmol/L (ref 135–145)
Total Bilirubin: 0.5 mg/dL (ref 0.3–1.2)
Total Protein: 6.6 g/dL (ref 6.5–8.1)

## 2015-03-23 LAB — URINALYSIS, ROUTINE W REFLEX MICROSCOPIC
Bilirubin Urine: NEGATIVE
Glucose, UA: NEGATIVE mg/dL
Hgb urine dipstick: NEGATIVE
Ketones, ur: NEGATIVE mg/dL
Leukocytes, UA: NEGATIVE
Nitrite: NEGATIVE
Protein, ur: NEGATIVE mg/dL
Specific Gravity, Urine: 1.007 (ref 1.005–1.030)
Urobilinogen, UA: 0.2 mg/dL (ref 0.0–1.0)
pH: 7.5 (ref 5.0–8.0)

## 2015-03-23 LAB — BLOOD GAS, ARTERIAL
Acid-base deficit: 0.2 mmol/L (ref 0.0–2.0)
Bicarbonate: 23.6 mEq/L (ref 20.0–24.0)
Drawn by: 206361
FIO2: 0.21
O2 Saturation: 97.3 %
Patient temperature: 98.6
TCO2: 24.7 mmol/L (ref 0–100)
pCO2 arterial: 36.4 mmHg (ref 35.0–45.0)
pH, Arterial: 7.429 (ref 7.350–7.450)
pO2, Arterial: 89.7 mmHg (ref 80.0–100.0)

## 2015-03-23 LAB — CBC
HCT: 39.9 % (ref 36.0–46.0)
Hemoglobin: 13.3 g/dL (ref 12.0–15.0)
MCH: 30.4 pg (ref 26.0–34.0)
MCHC: 33.3 g/dL (ref 30.0–36.0)
MCV: 91.3 fL (ref 78.0–100.0)
Platelets: 258 10*3/uL (ref 150–400)
RBC: 4.37 MIL/uL (ref 3.87–5.11)
RDW: 13.6 % (ref 11.5–15.5)
WBC: 5.5 10*3/uL (ref 4.0–10.5)

## 2015-03-23 LAB — SURGICAL PCR SCREEN
MRSA, PCR: NEGATIVE
STAPHYLOCOCCUS AUREUS: NEGATIVE

## 2015-03-23 LAB — APTT: aPTT: 29 seconds (ref 24–37)

## 2015-03-23 LAB — PROTIME-INR
INR: 1.03 (ref 0.00–1.49)
Prothrombin Time: 13.7 seconds (ref 11.6–15.2)

## 2015-03-23 NOTE — Progress Notes (Signed)
req'd echo, ekg(03/02/15) from dr brian Bettina Gavia  [facility]

## 2015-03-23 NOTE — Pre-Procedure Instructions (Addendum)
Michelle King  03/23/2015      [facility] - [place], [place] - [address] [place] [address] Phone: [phone] Fax: [phone]    Your procedure is scheduled on 03/25/15  Report to [facility] short stay admitting at 530 A.M.  Call this number if you have problems the morning of surgery:  [phone]   Remember:  Do not eat food or drink liquids after midnight.  Take these medicines the morning of surgery with A SIP OF WATER inhaler , nebulizer if needed (bring inhaler with you),xanax if needed, pain needed if needed    Aspirin, plavix per dr  Bridgette Habermann all herbel meds, nsaids (aleve,naproxen,advil,ibuprofen) NOW  Including vitamins    Do not wear jewelry, make-up or nail polish.  Do not wear lotions, powders, or perfumes.  You may wear deodorant.  Do not shave 48 hours prior to surgery.  Men may shave face and neck.  Do not bring valuables to the hospital.  [facility] is not responsible for any belongings or valuables.  Contacts, dentures or bridgework may not be worn into surgery.  Leave your suitcase in the car.  After surgery it may be brought to your room.  For patients admitted to the hospital, discharge time will be determined by your treatment team.  Patients discharged the day of surgery will not be allowed to drive home.   Name and phone number of your driver:    Special instructions:   Special Instructions: [name] - Preparing for Surgery  Before surgery, you can play an important role.  Because skin is not sterile, your skin needs to be as free of germs as possible.  You can reduce the number of germs on you skin by washing with CHG (chlorahexidine gluconate) soap before surgery.  CHG is an antiseptic cleaner which kills germs and bonds with the skin to continue killing germs even after washing.  Please DO NOT use if you have an allergy to CHG or antibacterial soaps.  If your skin becomes reddened/irritated stop  using the CHG and inform your nurse when you arrive at Short Stay.  Do not shave (including legs and underarms) for at least 48 hours prior to the first CHG shower.  You may shave your face.  Please follow these instructions carefully:   1.  Shower with CHG Soap the night before surgery and the morning of Surgery.  2.  If you choose to wash your hair, wash your hair first as usual with your normal shampoo.  3.  After you shampoo, rinse your hair and body thoroughly to remove the Shampoo.  4.  Use CHG as you would any other liquid soap.  You can apply chg directly  to the skin and wash gently with scrungie or a clean washcloth.  5.  Apply the CHG Soap to your body ONLY FROM THE NECK DOWN.  Do not use on open wounds or open sores.  Avoid contact with your eyes ears, mouth and genitals (private parts).  Wash genitals (private parts)       with your normal soap.  6.  Wash thoroughly, paying special attention to the area where your surgery will be performed.  7.  Thoroughly rinse your body with warm water from the neck down.  8.  DO NOT shower/wash with your normal soap after using and rinsing off the CHG Soap.  9.  Pat yourself dry with a clean towel.  10.  Wear clean pajamas.            11.  Place clean sheets on your bed the night of your first shower and do not sleep with pets.  Day of Surgery  Do not apply any lotions/deodorants the morning of surgery.  Please wear clean clothes to the hospital/surgery center.  Please read over the following fact sheets that you were given. Pain Booklet, Coughing and Deep Breathing, Blood Transfusion Information, MRSA Information and Surgical Site Infection Prevention

## 2015-03-24 LAB — ABO/RH: ABO/RH(D): O NEG

## 2015-03-24 LAB — TYPE AND SCREEN
ABO/RH(D): O NEG
Antibody Screen: NEGATIVE

## 2015-03-24 NOTE — Progress Notes (Signed)
Anesthesia Chart Review: Patient is a 54 year old female scheduled for video bronchoscopy, right VATS, lung resection on 03/25/15 by Dr. Servando Snare. DX. Right lung cancer.  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 PCP is Dr. Charlotte Sanes.  Meds include Alirocumab, albuterol, ASA, Plavix (held 03/19/15), Ellipta, Norco, Atrovent.   Cardiologist is Dr. Bettina Gavia with [facility] (see Care Everywhere). He saw her on 03/02/15 for follow-up and pre-operative evaluation. Echo was updated prior to surgery.   03/02/15 EKG ([facility]): NSR  03/07/15 Echo ([facility]):  LV cavity is normal is size. Mild decrease in global wall motion. EF 45-50%. Trileaflet AV with no regurgitation. Mild AV leaflet calcification. Structurally normal MV with trace MR. Structurally normal TV with mild TR.  02/18/14 Nuclear stress test ([facility]): Myocardial perfusion imaging showed no evidence of ischemia. Overall LV systolic function was normal without regional wll motion abnormalities. The LVEF was 56%.  04/19/09 [facility]:  1. Acute MI secondary to total occlusion of a proximal LAD, with successful PCI with BMS. Non-obstructive RCA (40-50% mid RCA and at the junction of the mid and distal vessel just beyond an area of ectasia through the mid vessel) and  left CX stenosis (40-50% mid OM1). Severe segmental LV dysfunction with LVEF estimated at 20%.   03/23/15 CXR: IMPRESSION: Stable right upper lobe mass lesion.  03/02/15 PFTs: Minimal Obstructive Airways Disease Insignificant response to bronchodilator Minimal Diffusion Defect FEV1 2.04 100% DLCO 14.13 74 %  Preoperative labs noted.   If no acute changes then I anticipate that she can proceed as planned.  [facility]/Anesthesiology Phone [phone] 03/24/2015 12:46 PM

## 2015-03-25 ENCOUNTER — Inpatient Hospital Stay (HOSPITAL_COMMUNITY): Payer: Medicaid Other | Admitting: Certified Registered Nurse Anesthetist

## 2015-03-25 ENCOUNTER — Encounter (HOSPITAL_COMMUNITY)
Admission: RE | Disposition: A | Discharge status: Home / Self Care | Payer: Self-pay | Source: Ambulatory Visit | Attending: Cardiothoracic Surgery

## 2015-03-25 ENCOUNTER — Inpatient Hospital Stay (HOSPITAL_COMMUNITY)
Admission: RE | Admit: 2015-03-25 | Discharge: 2015-05-05 | DRG: 164 | Disposition: A | Discharge status: Home / Self Care | Payer: Medicaid Other | Source: Ambulatory Visit | Attending: Cardiothoracic Surgery | Admitting: Cardiothoracic Surgery

## 2015-03-25 ENCOUNTER — Encounter (HOSPITAL_COMMUNITY): Payer: Self-pay | Admitting: *Deleted

## 2015-03-25 ENCOUNTER — Inpatient Hospital Stay (HOSPITAL_COMMUNITY): Payer: Medicaid Other | Admitting: Vascular Surgery

## 2015-03-25 ENCOUNTER — Inpatient Hospital Stay (HOSPITAL_COMMUNITY): Payer: Medicaid Other

## 2015-03-25 DIAGNOSIS — Z888 Allergy status to other drugs, medicaments and biological substances status: Secondary | ICD-10-CM | POA: Diagnosis not present

## 2015-03-25 DIAGNOSIS — K640 First degree hemorrhoids: Secondary | ICD-10-CM | POA: Diagnosis present

## 2015-03-25 DIAGNOSIS — D62 Acute posthemorrhagic anemia: Secondary | ICD-10-CM | POA: Diagnosis not present

## 2015-03-25 DIAGNOSIS — E785 Hyperlipidemia, unspecified: Secondary | ICD-10-CM | POA: Diagnosis present

## 2015-03-25 DIAGNOSIS — Z9689 Presence of other specified functional implants: Secondary | ICD-10-CM

## 2015-03-25 DIAGNOSIS — Q2733 Arteriovenous malformation of digestive system vessel: Secondary | ICD-10-CM

## 2015-03-25 DIAGNOSIS — Z902 Acquired absence of lung [part of]: Secondary | ICD-10-CM

## 2015-03-25 DIAGNOSIS — J939 Pneumothorax, unspecified: Secondary | ICD-10-CM

## 2015-03-25 DIAGNOSIS — Z8249 Family history of ischemic heart disease and other diseases of the circulatory system: Secondary | ICD-10-CM

## 2015-03-25 DIAGNOSIS — C3411 Malignant neoplasm of upper lobe, right bronchus or lung: Principal | ICD-10-CM | POA: Diagnosis present

## 2015-03-25 DIAGNOSIS — C3401 Malignant neoplasm of right main bronchus: Secondary | ICD-10-CM

## 2015-03-25 DIAGNOSIS — T40605A Adverse effect of unspecified narcotics, initial encounter: Secondary | ICD-10-CM | POA: Diagnosis not present

## 2015-03-25 DIAGNOSIS — K921 Melena: Secondary | ICD-10-CM | POA: Diagnosis not present

## 2015-03-25 DIAGNOSIS — J449 Chronic obstructive pulmonary disease, unspecified: Secondary | ICD-10-CM | POA: Diagnosis present

## 2015-03-25 DIAGNOSIS — Z885 Allergy status to narcotic agent status: Secondary | ICD-10-CM | POA: Diagnosis not present

## 2015-03-25 DIAGNOSIS — I2510 Atherosclerotic heart disease of native coronary artery without angina pectoris: Secondary | ICD-10-CM | POA: Diagnosis present

## 2015-03-25 DIAGNOSIS — J942 Hemothorax: Secondary | ICD-10-CM

## 2015-03-25 DIAGNOSIS — J9382 Other air leak: Secondary | ICD-10-CM

## 2015-03-25 DIAGNOSIS — Z9221 Personal history of antineoplastic chemotherapy: Secondary | ICD-10-CM | POA: Diagnosis not present

## 2015-03-25 DIAGNOSIS — Z09 Encounter for follow-up examination after completed treatment for conditions other than malignant neoplasm: Secondary | ICD-10-CM

## 2015-03-25 DIAGNOSIS — J95812 Postprocedural air leak: Secondary | ICD-10-CM | POA: Diagnosis not present

## 2015-03-25 DIAGNOSIS — Z955 Presence of coronary angioplasty implant and graft: Secondary | ICD-10-CM

## 2015-03-25 DIAGNOSIS — Z681 Body mass index (BMI) 19 or less, adult: Secondary | ICD-10-CM

## 2015-03-25 DIAGNOSIS — R64 Cachexia: Secondary | ICD-10-CM | POA: Diagnosis present

## 2015-03-25 DIAGNOSIS — I252 Old myocardial infarction: Secondary | ICD-10-CM | POA: Diagnosis not present

## 2015-03-25 DIAGNOSIS — C3490 Malignant neoplasm of unspecified part of unspecified bronchus or lung: Secondary | ICD-10-CM

## 2015-03-25 DIAGNOSIS — Z800 Family history of malignant neoplasm of digestive organs: Secondary | ICD-10-CM | POA: Diagnosis not present

## 2015-03-25 DIAGNOSIS — K5903 Drug induced constipation: Secondary | ICD-10-CM | POA: Diagnosis not present

## 2015-03-25 DIAGNOSIS — R040 Epistaxis: Secondary | ICD-10-CM | POA: Diagnosis not present

## 2015-03-25 DIAGNOSIS — F17210 Nicotine dependence, cigarettes, uncomplicated: Secondary | ICD-10-CM | POA: Diagnosis present

## 2015-03-25 DIAGNOSIS — I700 Atherosclerosis of aorta: Secondary | ICD-10-CM | POA: Diagnosis present

## 2015-03-25 DIAGNOSIS — T45515A Adverse effect of anticoagulants, initial encounter: Secondary | ICD-10-CM | POA: Diagnosis not present

## 2015-03-25 DIAGNOSIS — R599 Enlarged lymph nodes, unspecified: Secondary | ICD-10-CM | POA: Diagnosis present

## 2015-03-25 DIAGNOSIS — Z801 Family history of malignant neoplasm of trachea, bronchus and lung: Secondary | ICD-10-CM

## 2015-03-25 DIAGNOSIS — Z803 Family history of malignant neoplasm of breast: Secondary | ICD-10-CM | POA: Diagnosis not present

## 2015-03-25 DIAGNOSIS — Z9889 Other specified postprocedural states: Secondary | ICD-10-CM

## 2015-03-25 DIAGNOSIS — J95811 Postprocedural pneumothorax: Secondary | ICD-10-CM | POA: Diagnosis not present

## 2015-03-25 HISTORY — PX: VIDEO BRONCHOSCOPY: SHX5072

## 2015-03-25 HISTORY — PX: VIDEO ASSISTED THORACOSCOPY (VATS)/WEDGE RESECTION: SHX6174

## 2015-03-25 HISTORY — DX: Acquired absence of lung (part of): Z90.2

## 2015-03-25 LAB — BASIC METABOLIC PANEL
Anion gap: 5 (ref 5–15)
BUN: 9 mg/dL (ref 6–20)
CO2: 27 mmol/L (ref 22–32)
Calcium: 8.2 mg/dL — ABNORMAL LOW (ref 8.9–10.3)
Chloride: 104 mmol/L (ref 101–111)
Creatinine, Ser: 0.54 mg/dL (ref 0.44–1.00)
GFR calc Af Amer: >60 mL/min (ref >60)
GFR calc non Af Amer: >60 mL/min (ref >60)
Glucose, Bld: 135 mg/dL — ABNORMAL HIGH (ref 65–99)
Potassium: 3.9 mmol/L (ref 3.5–5.1)
Sodium: 136 mmol/L (ref 135–145)

## 2015-03-25 LAB — CBC
HCT: 29 % — ABNORMAL LOW (ref 36.0–46.0)
Hemoglobin: 9.6 g/dL — ABNORMAL LOW (ref 12.0–15.0)
MCH: 30.2 pg (ref 26.0–34.0)
MCHC: 33.1 g/dL (ref 30.0–36.0)
MCV: 91.2 fL (ref 78.0–100.0)
Platelets: 205 10*3/uL (ref 150–400)
RBC: 3.18 MIL/uL — ABNORMAL LOW (ref 3.87–5.11)
RDW: 13.5 % (ref 11.5–15.5)
WBC: 12.5 10*3/uL — ABNORMAL HIGH (ref 4.0–10.5)

## 2015-03-25 SURGERY — BRONCHOSCOPY, VIDEO-ASSISTED
Anesthesia: General | Site: Chest | Laterality: Right

## 2015-03-25 MED ORDER — GLYCOPYRROLATE 0.2 MG/ML IJ SOLN
INTRAMUSCULAR | Status: DC | PRN
  Administered 2015-03-25: .4 mg via INTRAVENOUS

## 2015-03-25 MED ORDER — ROCURONIUM BROMIDE 100 MG/10ML IV SOLN
INTRAVENOUS | Status: DC | PRN
  Administered 2015-03-25: 10 mg via INTRAVENOUS
  Administered 2015-03-25: 40 mg via INTRAVENOUS

## 2015-03-25 MED ORDER — POTASSIUM CHLORIDE 10 MEQ/50ML IV SOLN
10.0000 meq | Freq: Every day | INTRAVENOUS | Status: DC | PRN
  Filled 2015-03-25 (×3): qty 50

## 2015-03-25 MED ORDER — NALOXONE HCL 0.4 MG/ML IJ SOLN: 0.4000 mg | INTRAMUSCULAR | Status: DC | PRN

## 2015-03-25 MED ORDER — DEXTROSE-NACL 5-0.45 % IV SOLN
INTRAVENOUS | Status: DC
  Administered 2015-03-25: 17:00:00 via INTRAVENOUS
  Administered 2015-03-26: 50 mL/h via INTRAVENOUS
  Administered 2015-03-26 – 2015-04-02 (×3): via INTRAVENOUS

## 2015-03-25 MED ORDER — METOCLOPRAMIDE HCL 5 MG/ML IJ SOLN
10.0000 mg | Freq: Four times a day (QID) | INTRAMUSCULAR | Status: AC
  Administered 2015-03-25 – 2015-03-26 (×4): 10 mg via INTRAVENOUS
  Filled 2015-03-25 (×3): qty 2

## 2015-03-25 MED ORDER — PROPOFOL 10 MG/ML IV BOLUS
INTRAVENOUS | Status: AC
  Filled 2015-03-25: qty 20

## 2015-03-25 MED ORDER — BUPIVACAINE 0.5 % ON-Q PUMP SINGLE CATH 400 ML
400.0000 mL | INJECTION | Status: DC
  Filled 2015-03-25: qty 400

## 2015-03-25 MED ORDER — BUPIVACAINE 0.5 % ON-Q PUMP SINGLE CATH 400 ML
INJECTION | Status: DC | PRN
  Administered 2015-03-25: 400 mL

## 2015-03-25 MED ORDER — LACTATED RINGERS IV SOLN
INTRAVENOUS | Status: DC | PRN
  Administered 2015-03-25 (×3): via INTRAVENOUS

## 2015-03-25 MED ORDER — DIPHENHYDRAMINE HCL 50 MG/ML IJ SOLN
12.5000 mg | Freq: Four times a day (QID) | INTRAMUSCULAR | Status: DC | PRN
  Administered 2015-03-28: 12.5 mg via INTRAVENOUS
  Filled 2015-03-25: qty 1

## 2015-03-25 MED ORDER — BUPIVACAINE HCL (PF) 0.5 % IJ SOLN
INTRAMUSCULAR | Status: DC | PRN
  Administered 2015-03-25: 10 mL

## 2015-03-25 MED ORDER — PROMETHAZINE HCL 25 MG/ML IJ SOLN
6.2500 mg | INTRAMUSCULAR | Status: DC | PRN
Start: 2015-03-25 — End: 2015-03-25

## 2015-03-25 MED ORDER — PHENYLEPHRINE HCL 10 MG/ML IJ SOLN
10.0000 mg | INTRAVENOUS | Status: DC | PRN
  Administered 2015-03-25: 10 ug/min via INTRAVENOUS

## 2015-03-25 MED ORDER — LUNG SURGERY BOOK
Freq: Once | Status: AC
  Administered 2015-03-25: 18:00:00
  Filled 2015-03-25: qty 1

## 2015-03-25 MED ORDER — FENTANYL CITRATE (PF) 250 MCG/5ML IJ SOLN
INTRAMUSCULAR | Status: AC
  Filled 2015-03-25: qty 5

## 2015-03-25 MED ORDER — BUPIVACAINE ON-Q PAIN PUMP (FOR ORDER SET NO CHG)
INJECTION | Status: AC
  Filled 2015-03-25: qty 1

## 2015-03-25 MED ORDER — HYDROMORPHONE HCL 1 MG/ML IJ SOLN
INTRAMUSCULAR | Status: AC
  Filled 2015-03-25: qty 1

## 2015-03-25 MED ORDER — FLUTICASONE FUROATE-VILANTEROL 200-25 MCG/INH IN AEPB: 1.0000 | INHALATION_SPRAY | Freq: Every day | RESPIRATORY_TRACT | Status: DC

## 2015-03-25 MED ORDER — SENNOSIDES-DOCUSATE SODIUM 8.6-50 MG PO TABS
1.0000 | ORAL_TABLET | Freq: Every day | ORAL | Status: DC
  Administered 2015-03-26 – 2015-05-04 (×21): 1 via ORAL
  Filled 2015-03-25 (×45): qty 1

## 2015-03-25 MED ORDER — HYDROMORPHONE HCL 1 MG/ML IJ SOLN
0.2500 mg | INTRAMUSCULAR | Status: DC | PRN
  Administered 2015-03-25 (×3): 0.5 mg via INTRAVENOUS

## 2015-03-25 MED ORDER — BUPIVACAINE HCL (PF) 0.5 % IJ SOLN
INTRAMUSCULAR | Status: AC
  Filled 2015-03-25: qty 10

## 2015-03-25 MED ORDER — ONDANSETRON HCL 4 MG/2ML IJ SOLN: 4.0000 mg | Freq: Four times a day (QID) | INTRAMUSCULAR | Status: DC | PRN

## 2015-03-25 MED ORDER — NEOSTIGMINE METHYLSULFATE 10 MG/10ML IV SOLN
INTRAVENOUS | Status: DC | PRN
  Administered 2015-03-25: 3 mg via INTRAVENOUS

## 2015-03-25 MED ORDER — DEXTROSE 5 % IV SOLN
1.5000 g | INTRAVENOUS | Status: AC
  Administered 2015-03-25: 1.5 g via INTRAVENOUS
  Filled 2015-03-25: qty 1.5

## 2015-03-25 MED ORDER — SODIUM CHLORIDE 0.9 % IJ SOLN: 9.0000 mL | INTRAMUSCULAR | Status: DC | PRN

## 2015-03-25 MED ORDER — FENTANYL CITRATE (PF) 100 MCG/2ML IJ SOLN
INTRAMUSCULAR | Status: DC | PRN
  Administered 2015-03-25: 150 ug via INTRAVENOUS
  Administered 2015-03-25 (×2): 100 ug via INTRAVENOUS

## 2015-03-25 MED ORDER — BUDESONIDE 0.5 MG/2ML IN SUSP
0.5000 mg | Freq: Two times a day (BID) | RESPIRATORY_TRACT | Status: DC
  Administered 2015-03-25 – 2015-05-05 (×79): 0.5 mg via RESPIRATORY_TRACT
  Filled 2015-03-25 (×86): qty 2

## 2015-03-25 MED ORDER — DEXTROSE 5 % IV SOLN
1.5000 g | Freq: Two times a day (BID) | INTRAVENOUS | Status: AC
  Administered 2015-03-25 – 2015-03-26 (×2): 1.5 g via INTRAVENOUS
  Filled 2015-03-25 (×2): qty 1.5

## 2015-03-25 MED ORDER — DIPHENHYDRAMINE HCL 12.5 MG/5ML PO ELIX
12.5000 mg | ORAL_SOLUTION | Freq: Four times a day (QID) | ORAL | Status: DC | PRN
  Filled 2015-03-25: qty 5

## 2015-03-25 MED ORDER — ONDANSETRON HCL 4 MG/2ML IJ SOLN
INTRAMUSCULAR | Status: DC | PRN
  Administered 2015-03-25: 4 mg via INTRAVENOUS

## 2015-03-25 MED ORDER — PROPOFOL 10 MG/ML IV BOLUS
INTRAVENOUS | Status: DC | PRN
  Administered 2015-03-25: 100 mg via INTRAVENOUS

## 2015-03-25 MED ORDER — ACETAMINOPHEN 500 MG PO TABS
1000.0000 mg | ORAL_TABLET | Freq: Four times a day (QID) | ORAL | Status: AC
  Administered 2015-03-25 – 2015-03-28 (×12): 1000 mg via ORAL
  Filled 2015-03-25 (×19): qty 2

## 2015-03-25 MED ORDER — VECURONIUM BROMIDE 10 MG IV SOLR
INTRAVENOUS | Status: DC | PRN
  Administered 2015-03-25: 3 mg via INTRAVENOUS
  Administered 2015-03-25: 2 mg via INTRAVENOUS
  Administered 2015-03-25: 3 mg via INTRAVENOUS

## 2015-03-25 MED ORDER — HEMOSTATIC AGENTS (NO CHARGE) OPTIME
TOPICAL | Status: DC | PRN
  Administered 2015-03-25: 1 via TOPICAL

## 2015-03-25 MED ORDER — CETYLPYRIDINIUM CHLORIDE 0.05 % MT LIQD
7.0000 mL | Freq: Two times a day (BID) | OROMUCOSAL | Status: DC
  Administered 2015-03-25 – 2015-03-26 (×2): 7 mL via OROMUCOSAL

## 2015-03-25 MED ORDER — ALPRAZOLAM 0.5 MG PO TABS
0.5000 mg | ORAL_TABLET | Freq: Three times a day (TID) | ORAL | Status: DC | PRN
  Administered 2015-04-06 – 2015-05-02 (×7): 0.5 mg via ORAL
  Filled 2015-03-25 (×8): qty 1

## 2015-03-25 MED ORDER — MIDAZOLAM HCL 2 MG/2ML IJ SOLN
INTRAMUSCULAR | Status: AC
  Filled 2015-03-25: qty 4

## 2015-03-25 MED ORDER — ACETAMINOPHEN 160 MG/5ML PO SOLN
1000.0000 mg | Freq: Four times a day (QID) | ORAL | Status: AC
  Administered 2015-03-29 – 2015-03-30 (×6): 1000 mg via ORAL
  Filled 2015-03-25 (×5): qty 40.6

## 2015-03-25 MED ORDER — ASPIRIN EC 81 MG PO TBEC
81.0000 mg | DELAYED_RELEASE_TABLET | Freq: Every day | ORAL | Status: DC
  Administered 2015-03-26 – 2015-05-05 (×40): 81 mg via ORAL
  Filled 2015-03-25 (×41): qty 1

## 2015-03-25 MED ORDER — 0.9 % SODIUM CHLORIDE (POUR BTL) OPTIME
TOPICAL | Status: DC | PRN
  Administered 2015-03-25: 1000 mL

## 2015-03-25 MED ORDER — ALBUTEROL SULFATE (2.5 MG/3ML) 0.083% IN NEBU: 2.5000 mg | INHALATION_SOLUTION | Freq: Four times a day (QID) | RESPIRATORY_TRACT | Status: DC | PRN

## 2015-03-25 MED ORDER — PHENYLEPHRINE HCL 10 MG/ML IJ SOLN
INTRAMUSCULAR | Status: DC | PRN
  Administered 2015-03-25 (×6): 80 ug via INTRAVENOUS

## 2015-03-25 MED ORDER — MIDAZOLAM HCL 5 MG/5ML IJ SOLN
INTRAMUSCULAR | Status: DC | PRN
Start: 2015-03-25 — End: 2015-03-25
  Administered 2015-03-25: 2 mg via INTRAVENOUS

## 2015-03-25 MED ORDER — BISACODYL 5 MG PO TBEC
10.0000 mg | DELAYED_RELEASE_TABLET | Freq: Every day | ORAL | Status: DC
  Administered 2015-03-25 – 2015-04-13 (×13): 10 mg via ORAL
  Administered 2015-04-15: 5 mg via ORAL
  Administered 2015-04-16 – 2015-05-05 (×12): 10 mg via ORAL
  Filled 2015-03-25 (×29): qty 2

## 2015-03-25 MED ORDER — FENTANYL 10 MCG/ML IV SOLN
INTRAVENOUS | Status: DC
  Administered 2015-03-25: 135 ug via INTRAVENOUS
  Administered 2015-03-25: 90 ug via INTRAVENOUS
  Administered 2015-03-25: 14:00:00 via INTRAVENOUS
  Administered 2015-03-25 – 2015-03-26 (×2): 135 ug via INTRAVENOUS
  Administered 2015-03-26: 15 ug via INTRAVENOUS
  Administered 2015-03-26: 178.7 ug via INTRAVENOUS
  Administered 2015-03-26: 75 ug via INTRAVENOUS
  Administered 2015-03-26: 120 ug via INTRAVENOUS
  Administered 2015-03-26: 60 ug via INTRAVENOUS
  Administered 2015-03-26: 75 ug via INTRAVENOUS
  Administered 2015-03-26: 05:00:00 via INTRAVENOUS
  Administered 2015-03-26: 135 ug via INTRAVENOUS
  Administered 2015-03-27: 75 ug via INTRAVENOUS
  Administered 2015-03-27: 72.49 ug via INTRAVENOUS
  Administered 2015-03-27: 75 ug via INTRAVENOUS
  Administered 2015-03-27: 30 ug via INTRAVENOUS
  Administered 2015-03-27: 90 ug via INTRAVENOUS
  Administered 2015-03-27: 08:00:00 via INTRAVENOUS
  Administered 2015-03-27: 0 ug via INTRAVENOUS
  Administered 2015-03-27: 150 ug via INTRAVENOUS
  Administered 2015-03-28: 06:00:00 via INTRAVENOUS
  Administered 2015-03-28: 60 ug via INTRAVENOUS
  Administered 2015-03-28: 105 ug via INTRAVENOUS
  Filled 2015-03-25 (×4): qty 50

## 2015-03-25 MED ORDER — HYDROCODONE-ACETAMINOPHEN 10-325 MG PO TABS
1.0000 | ORAL_TABLET | ORAL | Status: DC | PRN
  Administered 2015-03-25 – 2015-05-05 (×112): 1 via ORAL
  Filled 2015-03-25 (×117): qty 1

## 2015-03-25 MED ORDER — IPRATROPIUM BROMIDE 0.02 % IN SOLN
0.5000 mg | Freq: Four times a day (QID) | RESPIRATORY_TRACT | Status: DC
  Administered 2015-03-25 – 2015-03-29 (×16): 0.5 mg via RESPIRATORY_TRACT
  Filled 2015-03-25 (×16): qty 2.5

## 2015-03-25 SURGICAL SUPPLY — 93 items
APPLICATOR TIP COSEAL (VASCULAR PRODUCTS) IMPLANT
APPLICATOR TIP EXT COSEAL (VASCULAR PRODUCTS) IMPLANT
BLADE SURG 11 STRL SS (BLADE) ×3 IMPLANT
BRUSH CYTOL CELLEBRITY 1.5X140 (MISCELLANEOUS) IMPLANT
CANISTER SUCTION 2500CC (MISCELLANEOUS) ×3 IMPLANT
CATH KIT ON Q 5IN SLV (PAIN MANAGEMENT) IMPLANT
CATH KIT ON-Q SILVERSOAK 5IN (CATHETERS) ×3 IMPLANT
CATH THORACIC 28FR (CATHETERS) ×3 IMPLANT
CATH THORACIC 36FR (CATHETERS) IMPLANT
CATH THORACIC 36FR RT ANG (CATHETERS) IMPLANT
CLIP TI MEDIUM 6 (CLIP) ×3 IMPLANT
CONN ST 1/4X3/8  BEN (MISCELLANEOUS) ×1
CONN ST 1/4X3/8 BEN (MISCELLANEOUS) ×2 IMPLANT
CONN Y 3/8X3/8X3/8  BEN (MISCELLANEOUS) ×1
CONN Y 3/8X3/8X3/8 BEN (MISCELLANEOUS) ×2 IMPLANT
CONT SPEC 4OZ CLIKSEAL STRL BL (MISCELLANEOUS) ×30 IMPLANT
COVER TABLE BACK 60X90 (DRAPES) ×3 IMPLANT
DERMABOND ADVANCED (GAUZE/BANDAGES/DRESSINGS)
DERMABOND ADVANCED .7 DNX12 (GAUZE/BANDAGES/DRESSINGS) IMPLANT
DRAIN CHANNEL 28F RND 3/8 FF (WOUND CARE) ×3 IMPLANT
DRAIN CHANNEL 32F RND 10.7 FF (WOUND CARE) IMPLANT
DRAPE LAPAROSCOPIC ABDOMINAL (DRAPES) ×3 IMPLANT
DRAPE WARM FLUID 44X44 (DRAPE) ×3 IMPLANT
DRILL BIT 7/64X5 (BIT) IMPLANT
ELECT BLADE 4.0 EZ CLEAN MEGAD (MISCELLANEOUS) ×3
ELECT REM PT RETURN 9FT ADLT (ELECTROSURGICAL) ×3
ELECTRODE BLDE 4.0 EZ CLN MEGD (MISCELLANEOUS) ×2 IMPLANT
ELECTRODE REM PT RTRN 9FT ADLT (ELECTROSURGICAL) ×2 IMPLANT
FORCEPS BIOP RJ4 1.8 (CUTTING FORCEPS) IMPLANT
GAUZE SPONGE 4X4 12PLY STRL (GAUZE/BANDAGES/DRESSINGS) ×3 IMPLANT
GLOVE BIO SURGEON STRL SZ 6.5 (GLOVE) ×15 IMPLANT
GOWN STRL REUS W/ TWL LRG LVL3 (GOWN DISPOSABLE) ×8 IMPLANT
GOWN STRL REUS W/TWL LRG LVL3 (GOWN DISPOSABLE) ×4
KIT BASIN OR (CUSTOM PROCEDURE TRAY) ×3 IMPLANT
KIT CLEAN ENDO COMPLIANCE (KITS) ×3 IMPLANT
KIT ROOM TURNOVER OR (KITS) ×3 IMPLANT
KIT SUCTION CATH 14FR (SUCTIONS) ×3 IMPLANT
MARKER SKIN DUAL TIP RULER LAB (MISCELLANEOUS) ×3 IMPLANT
NEEDLE BIOPSY TRANSBRONCH 21G (NEEDLE) IMPLANT
NS IRRIG 1000ML POUR BTL (IV SOLUTION) ×12 IMPLANT
OIL SILICONE PENTAX (PARTS (SERVICE/REPAIRS)) ×3 IMPLANT
PACK CHEST (CUSTOM PROCEDURE TRAY) ×3 IMPLANT
PAD ARMBOARD 7.5X6 YLW CONV (MISCELLANEOUS) ×12 IMPLANT
PASSER SUT SWANSON 36MM LOOP (INSTRUMENTS) ×3 IMPLANT
POUCH ENDO CATCH II 15MM (MISCELLANEOUS) ×3 IMPLANT
RELOAD GOLD ECHELON 45 (STAPLE) ×6 IMPLANT
RELOAD GREEN ECHELON 45 (STAPLE) ×6 IMPLANT
SCISSORS LAP 5X35 DISP (ENDOMECHANICALS) ×3 IMPLANT
SEALANT PROGEL (MISCELLANEOUS) ×3 IMPLANT
SEALANT SURG COSEAL 4ML (VASCULAR PRODUCTS) IMPLANT
SEALANT SURG COSEAL 8ML (VASCULAR PRODUCTS) IMPLANT
SOLUTION ANTI FOG 6CC (MISCELLANEOUS) ×6 IMPLANT
SPONGE GAUZE 4X4 12PLY STER LF (GAUZE/BANDAGES/DRESSINGS) ×3 IMPLANT
SPONGE INTESTINAL PEANUT (DISPOSABLE) ×9 IMPLANT
STAPLE RELOAD 2.5MM WHITE (STAPLE) ×9 IMPLANT
STAPLER ECHELON POWERED (MISCELLANEOUS) ×3 IMPLANT
STAPLER VASCULAR ECHELON 35 (CUTTER) ×3 IMPLANT
SUT PROLENE 3 0 SH DA (SUTURE) IMPLANT
SUT PROLENE 4 0 RB 1 (SUTURE)
SUT PROLENE 4-0 RB1 .5 CRCL 36 (SUTURE) IMPLANT
SUT SILK  1 MH (SUTURE) ×4
SUT SILK 1 MH (SUTURE) ×8 IMPLANT
SUT SILK 1 TIES 10X30 (SUTURE) IMPLANT
SUT SILK 2 0 SH (SUTURE) IMPLANT
SUT SILK 2 0SH CR/8 30 (SUTURE) IMPLANT
SUT SILK 3 0SH CR/8 30 (SUTURE) ×3 IMPLANT
SUT STEEL 1 (SUTURE) IMPLANT
SUT VIC AB 1 CTX 18 (SUTURE) ×3 IMPLANT
SUT VIC AB 1 CTX 36 (SUTURE)
SUT VIC AB 1 CTX36XBRD ANBCTR (SUTURE) IMPLANT
SUT VIC AB 2-0 CTX 36 (SUTURE) ×3 IMPLANT
SUT VIC AB 2-0 UR6 27 (SUTURE) IMPLANT
SUT VIC AB 3-0 SH 27 (SUTURE) ×3
SUT VIC AB 3-0 SH 27X BRD (SUTURE) ×6 IMPLANT
SUT VIC AB 3-0 SH 8-18 (SUTURE) IMPLANT
SUT VIC AB 3-0 X1 27 (SUTURE) IMPLANT
SUT VICRYL 0 UR6 27IN ABS (SUTURE) IMPLANT
SUT VICRYL 2 TP 1 (SUTURE) ×3 IMPLANT
SWAB COLLECTION DEVICE MRSA (MISCELLANEOUS) IMPLANT
SYR 20ML ECCENTRIC (SYRINGE) ×3 IMPLANT
SYSTEM SAHARA CHEST DRAIN ATS (WOUND CARE) ×3 IMPLANT
TAPE CLOTH SURG 4X10 WHT LF (GAUZE/BANDAGES/DRESSINGS) ×3 IMPLANT
TAPE UMBILICAL COTTON 1/8X30 (MISCELLANEOUS) ×3 IMPLANT
TIP APPLICATOR SPRAY EXTEND 16 (VASCULAR PRODUCTS) IMPLANT
TOWEL OR 17X24 6PK STRL BLUE (TOWEL DISPOSABLE) ×6 IMPLANT
TOWEL OR 17X26 10 PK STRL BLUE (TOWEL DISPOSABLE) ×6 IMPLANT
TRAP SPECIMEN MUCOUS 40CC (MISCELLANEOUS) ×3 IMPLANT
TRAY FOLEY CATH 16FRSI W/METER (SET/KITS/TRAYS/PACK) ×3 IMPLANT
TROCAR XCEL BLUNT TIP 100MML (ENDOMECHANICALS) IMPLANT
TUBE ANAEROBIC SPECIMEN COL (MISCELLANEOUS) IMPLANT
TUBE CONNECTING 20X1/4 (TUBING) ×6 IMPLANT
TUNNELER SHEATH ON-Q 11GX8 DSP (PAIN MANAGEMENT) ×3 IMPLANT
WATER STERILE IRR 1000ML POUR (IV SOLUTION) ×6 IMPLANT

## 2015-03-25 NOTE — Anesthesia Postprocedure Evaluation (Signed)
  Anesthesia Post-op Note  Patient: Michelle King  Procedure(s) Performed: Procedure(s): VIDEO BRONCHOSCOPY (N/A) VIDEO ASSISTED THORACOSCOPY (VATS)/LUNG RESECTION (Right)  Patient Location: PACU  Anesthesia Type:General  Level of Consciousness: awake and alert   Airway and Oxygen Therapy: Patient Spontanous Breathing  Post-op Pain: mild  Post-op Assessment: Post-op Vital signs reviewed, Patient's Cardiovascular Status Stable and Respiratory Function Stable              Post-op Vital Signs: stable  Last Vitals:  Filed Vitals:   03/25/15 1630  BP:   Pulse: 103  Temp:   Resp: 15    Complications: No apparent anesthesia complications

## 2015-03-25 NOTE — Anesthesia Procedure Notes (Signed)
Procedure Name: Intubation Performed by: Mariea Clonts Patient Re-evaluated:Patient Re-evaluated prior to inductionOxygen Delivery Method: Circle system utilized Preoxygenation: Pre-oxygenation with 100% oxygen Intubation Type: IV induction Ventilation: Mask ventilation without difficulty Laryngoscope Size: Mac and 3 Grade View: Grade I Tube type: Oral Endobronchial tube: Left, Double lumen EBT, EBT position confirmed by auscultation and EBT position confirmed by fiberoptic bronchoscope and 37 Fr Tube size: 8.0 mm Number of attempts: 1 Placement Confirmation: ETT inserted through vocal cords under direct vision,  breath sounds checked- equal and bilateral and positive ETCO2 Tube secured with: Tape Dental Injury: Teeth and Oropharynx as per pre-operative assessment

## 2015-03-25 NOTE — Transfer of Care (Signed)
Immediate Anesthesia Transfer of Care Note  Patient: Michelle King  Procedure(s) Performed: Procedure(s): VIDEO BRONCHOSCOPY (N/A) VIDEO ASSISTED THORACOSCOPY (VATS)/LUNG RESECTION (Right)  Patient Location: PACU  Anesthesia Type:General  Level of Consciousness: awake, alert  and oriented  Airway & Oxygen Therapy: Patient Spontanous Breathing and Patient connected to face mask oxygen  Post-op Assessment: Report given to RN, Post -op Vital signs reviewed and stable and Patient moving all extremities X 4  Post vital signs: Reviewed and stable  Last Vitals:  Filed Vitals:   03/25/15 0604  BP: 114/72  Pulse: 75  Temp: 36.8 C  Resp: 20    Complications: No apparent anesthesia complications

## 2015-03-25 NOTE — Progress Notes (Signed)
Patient ID: Michelle King, female   DOB: 18-Jun-1961, 54 y.o.   MRN: [id number] SICU Evening Rounds:  Hemodynamically stable.  Urine output ok  CT output low. 5 chamber air leak.  BMET    Component Value Date/Time   NA 136 03/25/2015 1830   K 3.9 03/25/2015 1830   CL 104 03/25/2015 1830   CO2 27 03/25/2015 1830   GLUCOSE 135* 03/25/2015 1830   BUN 9 03/25/2015 1830   CREATININE 0.54 03/25/2015 1830   CALCIUM 8.2* 03/25/2015 1830   GFRNONAA >60 03/25/2015 1830   GFRAA >60 03/25/2015 1830    CBC    Component Value Date/Time   WBC 12.5* 03/25/2015 1830   RBC 3.18* 03/25/2015 1830   HGB 9.6* 03/25/2015 1830   HCT 29.0* 03/25/2015 1830   PLT 205 03/25/2015 1830   MCV 91.2 03/25/2015 1830   MCH 30.2 03/25/2015 1830   MCHC 33.1 03/25/2015 1830   RDW 13.5 03/25/2015 1830   LYMPHSABS 3.6 04/19/2009 0950   MONOABS 0.7 04/19/2009 0950   EOSABS 0.1 04/19/2009 0950   BASOSABS 0.2* 04/19/2009 0950    A/P: stable. Continue current plans.

## 2015-03-25 NOTE — Brief Op Note (Addendum)
      [address]       [place] [address]             [phone]      03/25/2015  12:58 PM  PATIENT:  Michelle King  54 y.o. female  PRE-OPERATIVE DIAGNOSIS:  LUNG CANCER  POST-OPERATIVE DIAGNOSIS:  LUNG CANCER  PROCEDURE:  Procedure(s):  VIDEO BRONCHOSCOPY (N/A)  VIDEO ASSISTED THORACOSCOPY (VATS) -Right Upper Lobectomy -Insertion of On-Q -Lymph Node Dissection  SURGEON:  Surgeon(s) and Role:    * Grace Isaac, MD - Primary  PHYSICIAN ASSISTANT: Erin Barrett PA-C  ANESTHESIA:   general  EBL:  Total I/O In: 1000 [I.V.:1000] Out: 275 [Urine:175; Blood:100]  BLOOD ADMINISTERED:none  DRAINS: 28 Blake and Straight Chest Tube   LOCAL MEDICATIONS USED:  MARCAINE     SPECIMEN:  Source of Specimen:  Right Upper Lobectomy, Lymph Nodes  DISPOSITION OF SPECIMEN:  PATHOLOGY  COUNTS:  YES  TOURNIQUET:  * No tourniquets in log *  DICTATION: .Dragon Dictation  PLAN OF CARE: Admit to inpatient   PATIENT DISPOSITION:  ICU - intubated and hemodynamically stable.   Delay start of Pharmacological VTE agent (>24hrs) due to surgical blood loss or risk of bleeding: yes

## 2015-03-25 NOTE — H&P (Signed)
[address]       [place] [address]             [phone]                    Michelle King Medical Record #[id number] Date of Birth: 1960-08-20  Referring:Dr Deqquincy Bobby Rumpf Primary Care: Charlotte Sanes, MD  Chief Complaint:    Lung Cancer   History of Present Illness:    Michelle King 54 y.o. 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 Patient had noted cough for 6 months, then developed hemptysis in April 2016 . CT and PET and MRI of brain done at [facility]. Patient is long term smoker with significant copd. She is still smoking but decreased to 4-5 cig per day.She has history "massive " Mi in 2010 treated with coronary stent at [facility] hospital by Dr Burt Knack. Currently followed by Dr Bettina Gavia. She has recently seen Dr. Bettina Gavia and had a repeat echocardiogram done. Dr. Bettina Gavia has sent the associated notes for his visit.  She has completed course of chem and repeat ct of chest  7/28    Current Activity/ Functional Status:  Patient is independent with mobility/ambulation, transfers, ADL's, IADL's.   Zubrod Score: At the time of surgery this patient's most appropriate activity status/level should be described as: '[]'$     0    Normal activity, no symptoms '[x]'$     1    Restricted in physical strenuous activity but ambulatory, able to do out light work '[]'$     2    Ambulatory and capable of self care, unable to do work activities, up and about               >50 % of waking hours                              '[]'$     3    Only limited self care, in bed greater than 50% of waking hours '[]'$     4    Completely disabled, no self care, confined to bed or chair '[]'$     5    Moribund   Past Medical History  Diagnosis Date  . COPD (chronic obstructive pulmonary disease)   . Hyperlipidemia   . S/P hysterectomy   . Complication of  anesthesia   . PONV (postoperative nausea and vomiting)   . CAD (coronary artery disease)   . Myocardial infarction 2010  . Anginal pain   . Shortness of breath dyspnea   . Pneumonia     hx  . Cancer     lung    Past Surgical History  Procedure Laterality Date  . Appendectomy    . Tubal ligation    . Mouth surgery      teeth  . Cardiac catheterization      stents    Family History  Problem Relation Age of Onset  . Diabetes Mother   . Cancer Father     colon  . Stroke Sister   . Cancer Brother     colon cancer  . Stroke Sister   . Cancer Sister     lung cancer  . Heart disease Brother   . Cerebrovascular Disease Brother   . Heart disease  Brother   . Cerebrovascular Disease Brother     History   Social History  . Marital Status: Married    Spouse Name: N/A  . Number of Children: N/A  . Years of Education: N/A         Social History Main Topics  . Smoking status: Current Every Day Smoker -- 1.00 packs/day for 40 years    Types: Cigarettes  . Smokeless tobacco: Never Used  . Alcohol Use: No  . Drug Use: Not on file  . Sexual Activity: Not on file      History  Smoking status  . Current Every Day Smoker -- 0.50 packs/day for 40 years  . Types: Cigarettes  Smokeless tobacco  . Never Used    Comment: down to 4 aday    History  Alcohol Use No     Allergies  Allergen Reactions  . Codeine Itching    FACIAL SWELLING  . Rosuvastatin   . Sympathomimetics     Current Facility-Administered Medications  Medication Dose Route Frequency Provider Last Rate Last Dose  . cefUROXime (ZINACEF) 1.5 g in dextrose 5 % 50 mL IVPB  1.5 g Intravenous 60 min Pre-Op Grace Isaac, MD          Review of Systems:     Cardiac Review of Systems: Y or N  Chest Pain [  y  ]  Resting SOB Blue.Reese   ] Exertional SOB  [ y ]  Orthopnea [ y ]   Pedal Edema [ y  ]    Palpitations Blue.Reese  ] Syncope  [  ]   Presyncope [ y  ]  General Review of Systems: [Y] = yes [   ]=no Constitional: recent weight change [  ];  Wt loss over the last 3 months [   ] anorexia [  ]; fatigue [ y ]; nausea [  ]; night sweats [  ]; fever [  ]; or chills [  ];          Dental: poor dentition[  ]; Last Dentist visit:   Eye : blurred vision [  ]; diplopia [   ]; vision changes [  ];  Amaurosis fugax[  ]; Resp: cough [  ];  wheezing[y  ];  hemoptysis[ y ]; shortness of breath[ yy ]; paroxysmal nocturnal dyspnea[ y ]; dyspnea on exertion[  y]; or orthopnea[  ];  GI:  gallstones[  ], vomiting[  ];  dysphagia[  ]; melena[  ];  hematochezia [  ]; heartburn[  ];   Hx of  Colonoscopy[  ]; GU: kidney stones [  ]; hematuria[  ];   dysuria [  ];  nocturia[  ];  history of     obstruction [  ]; urinary frequency [  ]             Skin: rash, swelling[  ];, hair loss[  ];  peripheral edema[  ];  or itching[  ]; Musculosketetal: myalgias[  ];  joint swelling[  ];  joint erythema[  ];  joint pain[  ];  back pain[  ];  Heme/Lymph: bruising[  ];  bleeding[  ];  anemia[  ];  Neuro: TIA[  ];  headaches[  ];  stroke[  ];  vertigo[  ];  seizures[  ];   paresthesias[  ];  difficulty walking[  ];  Psych:depression[ y ]; Michelle King  ];  Endocrine: diabetes[ n ];  thyroid dysfunction[n  ];  Immunizations: Flu  up to date [?  ]; Pneumococcal up to date [?  ];  Other:  Physical Exam: BP 114/72 mmHg  Pulse 75  Temp(Src) 98.2 F (36.8 C) (Oral)  Resp 20  Ht '5\' 2"'$  (1.575 m)  Wt 102 lb (46.267 kg)  BMI 18.65 kg/m2  SpO2 98%  PHYSICAL EXAMINATION: General appearance: alert, cooperative, appears older than stated age, cachectic and no distress Head: Normocephalic, without obvious abnormality, atraumatic Neck: no adenopathy, no carotid bruit, no JVD, supple, symmetrical, trachea midline and thyroid not enlarged, symmetric, no tenderness/mass/nodules Lymph nodes: Cervical, supraclavicular, and axillary nodes normal. Resp: clear to auscultation bilaterally Back: symmetric, no curvature. ROM normal. No CVA  tenderness. Cardio: regular rate and rhythm, S1, S2 normal, no murmur, click, rub or gallop GI: soft, non-tender; bowel sounds normal; no masses,  no organomegaly Extremities: extremities normal, atraumatic, no cyanosis or edema Neurologic: Grossly normal   Diagnostic Studies & Laboratory data:     Recent Radiology Findings:  CLINICAL DATA: Malignant neoplasm of the right lung.  EXAM: CT CHEST WITH CONTRAST  TECHNIQUE: Multidetector CT imaging of the chest was performed during intravenous contrast administration.  CONTRAST: 80 cc of Isovue 370  COMPARISON: 10/16/2014  FINDINGS: Mediastinum: The heart size appears normal. No pericardial effusion. Aortic atherosclerosis. There is no enlarged mediastinal or hilar lymph nodes. The trachea appears patent and is midline. Normal appearance of the esophagus. No supraclavicular or axillary adenopathy identified.  Lungs/Pleura: No pleural effusion identified. Moderate changes of centrilobular emphysema. Right upper lobe lung mass measures 3.1 cm, image 20/series 4. Previously 4.7 cm. No new or enlarging pulmonary nodules or masses.  Upper Abdomen: No suspicious liver abnormality. The visualized portions of the spleen are unremarkable. The adrenal glands are both normal. The visualized portions of the kidneys and pancreas are also normal.  Musculoskeletal: Review of the visualized bony structures is negative for aggressive lytic or sclerotic bone lesion.  IMPRESSION: 1. Interval decrease in size of right lung mass. 2. Emphysema 3. Aortic atherosclerosis.   Electronically Signed By: Kerby Moors M.D. On: 02/10/2015 09:47  I have independently reviewed the above radiology studies  and reviewed the findings with the patient.     CT AND PET  AND MRI   4.1 CM  RIGHT UPPER MASS with contiguous adenopathy  PET scan suggests n2 nodes.   Recent Lab Findings: Lab Results  Component Value Date   WBC 7.9 04/22/2009    HGB 12.8 04/22/2009   HCT 37.2 04/22/2009   PLT 236 04/22/2009   GLUCOSE 97 04/22/2009   CHOL  04/19/2009    190        ATP III CLASSIFICATION:  <200     mg/dL   Desirable  200-239  mg/dL   Borderline High  >=240    mg/dL   High        SLIGHT HEMOLYSIS   TRIG 116 SLIGHT HEMOLYSIS 04/19/2009   HDL 36 SLIGHT HEMOLYSIS* 04/19/2009   LDLCALC * 04/19/2009    131        Total Cholesterol/HDL:CHD Risk Coronary Heart Disease Risk Table                     Men   Women  1/2 Average Risk   3.4   3.3  Average Risk       5.0   4.4  2 X Average Risk   9.6   7.1  3 X Average Risk  23.4   11.0  Use the calculated Patient Ratio above and the CHD Risk Table to determine the patient's CHD Risk.        ATP III CLASSIFICATION (LDL):  <100     mg/dL   Optimal  100-129  mg/dL   Near or Above                    Optimal  130-159  mg/dL   Borderline  160-189  mg/dL   High  >190     mg/dL   Very High   ALT 9 04/19/2009   AST 28 04/19/2009   NA 139 04/22/2009   K 4.3 04/22/2009   CL 103 04/22/2009   CREATININE 0.61 04/22/2009   BUN 16 04/22/2009   CO2 28 04/22/2009   TSH 1.539 Test methodology is 3rd generation TSH 04/19/2009   INR 0.94 04/19/2009   PFT's Pulmonary Function Diagnosis: Minimal Obstructive Airways Disease Insignificant response to bronchodilator Minimal Diffusion Defect FEV1 2.04 100%   DLCO  14.13 74 %   Assessment / Plan:   clinical stage IIIa (or poss Stage II) non small cell cancer adeno favored adeno  [facility] [id number] clinical T2N2Mo, Ct shows decreased size of mass significant copd current smoker- PFT complete History of cad with MI and stenting 2010 cardiology clearance has been obtained Cleared by cardiology- Dr Bettina Gavia for surgery  I discussed with the patient and her husband in detail proceeding with surgical resection of her right upper lobe lung cancer, she has had significant decrease in size of the primary mass in addition the associated  adenopathy. The patient understands that she does have advanced stage lung cancer but with her young age and without evidence of distant metastasis we will proceed with resection. Risks and options were discussed with her and her husband in detail. Dr. Bettina Gavia has right left recommendations about stopping her Plavix prior to surgery, and has done a updated echocardiogram.  The goals risks and alternatives of the planned surgical procedure Bronchoscopy, Right VATS, lung resection with node dissection  have been discussed with the patient in detail. The risks of the procedure including death, infection, stroke, myocardial infarction, bleeding, blood transfusion have all been discussed specifically.  I have quoted Michelle King a 4 % of perioperative mortality and a complication rate as high as 40 %. The patient's questions have been answered.Michelle King is willing  to proceed with the planned procedure.     Grace Isaac MD      [address] [place] [address] Office [phone]   Beeper [phone]  03/25/2015 7:06 AM

## 2015-03-25 NOTE — Anesthesia Preprocedure Evaluation (Addendum)
Anesthesia Evaluation  Patient identified by MRN, date of birth, ID band Patient awake    Reviewed: Allergy & Precautions, NPO status , Patient's Chart, lab work & pertinent test results  History of Anesthesia Complications (+) PONV  Airway Mallampati: II   Neck ROM: Full    Dental  (+) Missing, Poor Dentition, Dental Advisory Given   Pulmonary shortness of breath, COPD, Current Smoker,    breath sounds clear to auscultation       Cardiovascular + CAD and + Past MI   Rhythm:Regular Rate:Normal     Neuro/Psych negative neurological ROS     GI/Hepatic Neg liver ROS,   Endo/Other  negative endocrine ROS  Renal/GU negative Renal ROS     Musculoskeletal negative musculoskeletal ROS (+)   Abdominal   Peds  Hematology negative hematology ROS (+)   Anesthesia Other Findings   Reproductive/Obstetrics                            Anesthesia Physical Anesthesia Plan  ASA: III  Anesthesia Plan: General   Post-op Pain Management:    Induction: Intravenous  Airway Management Planned: Oral ETT and Double Lumen EBT  Additional Equipment: Arterial line and CVP  Intra-op Plan:   Post-operative Plan: Extubation in OR and Possible Post-op intubation/ventilation  Informed Consent: I have reviewed the patients History and Physical, chart, labs and discussed the procedure including the risks, benefits and alternatives for the proposed anesthesia with the patient or authorized representative who has indicated his/her understanding and acceptance.   Dental advisory given  Plan Discussed with:   Anesthesia Plan Comments:        Anesthesia Quick Evaluation

## 2015-03-26 ENCOUNTER — Inpatient Hospital Stay (HOSPITAL_COMMUNITY): Payer: Medicaid Other

## 2015-03-26 LAB — BLOOD GAS, ARTERIAL
ACID-BASE EXCESS: 1.3 mmol/L (ref 0.0–2.0)
BICARBONATE: 25.8 meq/L — AB (ref 20.0–24.0)
O2 Content: 3 L/min
O2 SAT: 99.1 %
PATIENT TEMPERATURE: 98.6
PO2 ART: 139 mmHg — AB (ref 80.0–100.0)
TCO2: 27.1 mmol/L (ref 0–100)
pCO2 arterial: 43.6 mmHg (ref 35.0–45.0)
pH, Arterial: 7.39 (ref 7.350–7.450)

## 2015-03-26 LAB — CBC
HEMATOCRIT: 27.5 % — AB (ref 36.0–46.0)
Hemoglobin: 9.2 g/dL — ABNORMAL LOW (ref 12.0–15.0)
MCH: 30.9 pg (ref 26.0–34.0)
MCHC: 33.5 g/dL (ref 30.0–36.0)
MCV: 92.3 fL (ref 78.0–100.0)
PLATELETS: 181 10*3/uL (ref 150–400)
RBC: 2.98 MIL/uL — AB (ref 3.87–5.11)
RDW: 13.6 % (ref 11.5–15.5)
WBC: 8.3 10*3/uL (ref 4.0–10.5)

## 2015-03-26 LAB — BASIC METABOLIC PANEL
Anion gap: 4 — ABNORMAL LOW (ref 5–15)
BUN: 6 mg/dL (ref 6–20)
CALCIUM: 8 mg/dL — AB (ref 8.9–10.3)
CO2: 26 mmol/L (ref 22–32)
Chloride: 106 mmol/L (ref 101–111)
Creatinine, Ser: 0.54 mg/dL (ref 0.44–1.00)
GFR calc Af Amer: >60 mL/min (ref >60)
GLUCOSE: 118 mg/dL — AB (ref 65–99)
Potassium: 3.8 mmol/L (ref 3.5–5.1)
SODIUM: 136 mmol/L (ref 135–145)

## 2015-03-26 NOTE — Progress Notes (Signed)
1 Day Post-Op Procedure(s) (LRB): VIDEO BRONCHOSCOPY (N/A) VIDEO ASSISTED THORACOSCOPY (VATS)/LUNG RESECTION (Right) Subjective:  No complaints  Walked around the ICU this morning.  Objective: Vital signs in last 24 hours: Temp:  [98.1 F (36.7 C)-98.7 F (37.1 C)] 98.1 F (36.7 C) (09/10 0700) Pulse Rate:  [73-113] 91 (09/10 0900) Cardiac Rhythm:  [-] Normal sinus rhythm (09/10 0800) Resp:  [9-23] 13 (09/10 0900) BP: (75-110)/(43-71) 99/55 mmHg (09/10 0900) SpO2:  [94 %-100 %] 97 % (09/10 1004) Arterial Line BP: (84-129)/(41-71) 110/51 mmHg (09/10 0900) Weight:  [47.991 kg (105 lb 12.8 oz)] 47.991 kg (105 lb 12.8 oz) (09/10 0500)  Hemodynamic parameters for last 24 hours:    Intake/Output from previous day: 09/09 0701 - 09/10 0700 In: 3503.3 [I.V.:3403.3; IV Piggyback:100] Out: 2145 [Urine:1635; Blood:100; Chest Tube:410] Intake/Output this shift: Total I/O In: 200 [I.V.:200] Out: 295 [Urine:225; Chest Tube:70]  General appearance: alert and cooperative Heart: regular rate and rhythm, S1, S2 normal, no murmur, click, rub or gallop Lungs: audible air leak on the right. 5 chamber air leak in [place].  Lab Results:  Recent Labs  03/25/15 1830 03/26/15 0300  WBC 12.5* 8.3  HGB 9.6* 9.2*  HCT 29.0* 27.5*  PLT 205 181   BMET:  Recent Labs  03/25/15 1830 03/26/15 0300  NA 136 136  K 3.9 3.8  CL 104 106  CO2 27 26  GLUCOSE 135* 118*  BUN 9 6  CREATININE 0.54 0.54  CALCIUM 8.2* 8.0*    PT/INR:  Recent Labs  03/23/15 1353  LABPROT 13.7  INR 1.03   ABG    Component Value Date/Time   PHART 7.390 03/26/2015 0415   HCO3 25.8* 03/26/2015 0415   TCO2 27.1 03/26/2015 0415   ACIDBASEDEF 0.2 03/23/2015 1353   O2SAT 99.1 03/26/2015 0415   CBG (last 3)  No results for input(s): GLUCAP in the last 72 hours.  Assessment/Plan: S/P Procedure(s) (LRB): VIDEO BRONCHOSCOPY (N/A) VIDEO ASSISTED THORACOSCOPY (VATS)/LUNG RESECTION (Right)  Hemodynamically  stable  Has significant air leak probably from dividing the fissure. Continue chest tubes to suction today.  IS and ambulation  Remove arterial line.   LOS: 1 day    Michelle King 03/26/2015

## 2015-03-27 ENCOUNTER — Inpatient Hospital Stay (HOSPITAL_COMMUNITY): Payer: Medicaid Other

## 2015-03-27 LAB — COMPREHENSIVE METABOLIC PANEL
ALK PHOS: 41 U/L (ref 38–126)
ALT: 11 U/L — AB (ref 14–54)
ANION GAP: 2 — AB (ref 5–15)
AST: 22 U/L (ref 15–41)
Albumin: 2.7 g/dL — ABNORMAL LOW (ref 3.5–5.0)
BUN: 5 mg/dL — ABNORMAL LOW (ref 6–20)
CALCIUM: 8.3 mg/dL — AB (ref 8.9–10.3)
CHLORIDE: 108 mmol/L (ref 101–111)
CO2: 29 mmol/L (ref 22–32)
CREATININE: 0.55 mg/dL (ref 0.44–1.00)
Glucose, Bld: 98 mg/dL (ref 65–99)
Potassium: 3.4 mmol/L — ABNORMAL LOW (ref 3.5–5.1)
SODIUM: 139 mmol/L (ref 135–145)
Total Bilirubin: 0.4 mg/dL (ref 0.3–1.2)
Total Protein: 4.8 g/dL — ABNORMAL LOW (ref 6.5–8.1)

## 2015-03-27 LAB — CBC
HCT: 24.5 % — ABNORMAL LOW (ref 36.0–46.0)
Hemoglobin: 8.1 g/dL — ABNORMAL LOW (ref 12.0–15.0)
MCH: 30.9 pg (ref 26.0–34.0)
MCHC: 33.1 g/dL (ref 30.0–36.0)
MCV: 93.5 fL (ref 78.0–100.0)
PLATELETS: 199 10*3/uL (ref 150–400)
RBC: 2.62 MIL/uL — AB (ref 3.87–5.11)
RDW: 13.8 % (ref 11.5–15.5)
WBC: 6.4 10*3/uL (ref 4.0–10.5)

## 2015-03-27 MED ORDER — POTASSIUM CHLORIDE 10 MEQ/50ML IV SOLN
10.0000 meq | INTRAVENOUS | Status: AC
  Administered 2015-03-27 (×3): 10 meq via INTRAVENOUS
  Filled 2015-03-27 (×3): qty 50

## 2015-03-27 NOTE — Progress Notes (Signed)
Utilization review completed.  

## 2015-03-27 NOTE — Progress Notes (Signed)
2 Days Post-Op Procedure(s) (LRB): VIDEO BRONCHOSCOPY (N/A) VIDEO ASSISTED THORACOSCOPY (VATS)/LUNG RESECTION (Right) Subjective:  Complains of back spasms   Objective: Vital signs in last 24 hours: Temp:  [98.5 F (36.9 C)-98.7 F (37.1 C)] 98.7 F (37.1 C) (09/11 0849) Pulse Rate:  [77-124] 102 (09/11 1200) Cardiac Rhythm:  [-] Sinus tachycardia (09/11 1200) Resp:  [12-25] 17 (09/11 1200) BP: (85-141)/(55-91) 141/91 mmHg (09/11 1200) SpO2:  [88 %-100 %] 100 % (09/11 1200) Weight:  [47.2 kg (104 lb 0.9 oz)] 47.2 kg (104 lb 0.9 oz) (09/11 0600)  Hemodynamic parameters for last 24 hours:    Intake/Output from previous day: 09/10 0701 - 09/11 0700 In: 1494.2 [P.O.:720; I.V.:724.2; IV Piggyback:50] Out: 2080 [Urine:1650; Chest Tube:430] Intake/Output this shift: Total I/O In: 340 [P.O.:240; IV Piggyback:100] Out: 635 [Urine:545; Chest Tube:90]  General appearance: alert and cooperative Heart: regular rate and rhythm, S1, S2 normal, no murmur, click, rub or gallop Lungs: clear Extremities: extremities normal, atraumatic, no cyanosis or edema Wound: dressing dry chest tube still has 4-5 chamber air leak.  Lab Results:  Recent Labs  03/26/15 0300 03/27/15 0318  WBC 8.3 6.4  HGB 9.2* 8.1*  HCT 27.5* 24.5*  PLT 181 199   BMET:  Recent Labs  03/26/15 0300 03/27/15 0318  NA 136 139  K 3.8 3.4*  CL 106 108  CO2 26 29  GLUCOSE 118* 98  BUN 6 5*  CREATININE 0.54 0.55  CALCIUM 8.0* 8.3*    PT/INR: No results for input(s): LABPROT, INR in the last 72 hours. ABG    Component Value Date/Time   PHART 7.390 03/26/2015 0415   HCO3 25.8* 03/26/2015 0415   TCO2 27.1 03/26/2015 0415   ACIDBASEDEF 0.2 03/23/2015 1353   O2SAT 99.1 03/26/2015 0415   CBG (last 3)  No results for input(s): GLUCAP in the last 72 hours.  Assessment/Plan: S/P Procedure(s) (LRB): VIDEO BRONCHOSCOPY (N/A) VIDEO ASSISTED THORACOSCOPY (VATS)/LUNG RESECTION (Right)  She is stable  overall Still has significant air leak. Will decrease suction to 10. CXR in am. Continue IS, ambulation.   LOS: 2 days    Gaye Pollack 03/27/2015

## 2015-03-28 ENCOUNTER — Inpatient Hospital Stay (HOSPITAL_COMMUNITY): Payer: Medicaid Other

## 2015-03-28 ENCOUNTER — Encounter (HOSPITAL_COMMUNITY): Payer: Self-pay | Admitting: Cardiothoracic Surgery

## 2015-03-28 LAB — BASIC METABOLIC PANEL
Anion gap: 4 — ABNORMAL LOW (ref 5–15)
CALCIUM: 8.4 mg/dL — AB (ref 8.9–10.3)
CO2: 28 mmol/L (ref 22–32)
Chloride: 107 mmol/L (ref 101–111)
Creatinine, Ser: 0.45 mg/dL (ref 0.44–1.00)
GFR calc Af Amer: >60 mL/min (ref >60)
GLUCOSE: 87 mg/dL (ref 65–99)
POTASSIUM: 3.6 mmol/L (ref 3.5–5.1)
Sodium: 139 mmol/L (ref 135–145)

## 2015-03-28 LAB — CBC
HEMATOCRIT: 24.4 % — AB (ref 36.0–46.0)
HEMOGLOBIN: 8 g/dL — AB (ref 12.0–15.0)
MCH: 30.4 pg (ref 26.0–34.0)
MCHC: 32.8 g/dL (ref 30.0–36.0)
MCV: 92.8 fL (ref 78.0–100.0)
Platelets: 206 10*3/uL (ref 150–400)
RBC: 2.63 MIL/uL — AB (ref 3.87–5.11)
RDW: 13.7 % (ref 11.5–15.5)
WBC: 6.5 10*3/uL (ref 4.0–10.5)

## 2015-03-28 MED ORDER — POTASSIUM CHLORIDE 10 MEQ/50ML IV SOLN
10.0000 meq | INTRAVENOUS | Status: AC
  Administered 2015-03-28 (×3): 10 meq via INTRAVENOUS
  Filled 2015-03-28 (×2): qty 50

## 2015-03-28 MED ORDER — ENOXAPARIN SODIUM 30 MG/0.3ML ~~LOC~~ SOLN
30.0000 mg | SUBCUTANEOUS | Status: DC
  Administered 2015-03-28 – 2015-04-09 (×11): 30 mg via SUBCUTANEOUS
  Filled 2015-03-28 (×16): qty 0.3

## 2015-03-28 MED ORDER — FENTANYL 10 MCG/ML IV SOLN
INTRAVENOUS | Status: DC
  Administered 2015-03-28 (×2): 150 ug via INTRAVENOUS
  Administered 2015-03-29: 135 ug via INTRAVENOUS
  Administered 2015-03-29 (×2): 105 ug via INTRAVENOUS
  Administered 2015-03-29: via INTRAVENOUS
  Administered 2015-03-29: 120 ug via INTRAVENOUS
  Administered 2015-03-29: 135 ug via INTRAVENOUS
  Administered 2015-03-29: 16:00:00 via INTRAVENOUS
  Administered 2015-03-29: 135 ug via INTRAVENOUS
  Administered 2015-03-30: 09:00:00 via INTRAVENOUS
  Administered 2015-03-30 (×2): 75 ug via INTRAVENOUS
  Administered 2015-03-30 (×2): 120 ug via INTRAVENOUS
  Administered 2015-03-30: 177.2 ug via INTRAVENOUS
  Administered 2015-03-30: 90 ug via INTRAVENOUS
  Administered 2015-03-31: 23:00:00 via INTRAVENOUS
  Administered 2015-03-31: 240 ug via INTRAVENOUS
  Administered 2015-03-31: 30 ug via INTRAVENOUS
  Administered 2015-03-31: 135 ug via INTRAVENOUS
  Administered 2015-03-31: 15 ug via INTRAVENOUS
  Administered 2015-03-31: 60 ug via INTRAVENOUS
  Administered 2015-03-31: 150 ug via INTRAVENOUS
  Administered 2015-04-01: 60 ug via INTRAVENOUS
  Administered 2015-04-01: 105 ug via INTRAVENOUS
  Administered 2015-04-01: 53.2 ug via INTRAVENOUS
  Administered 2015-04-01: 165 ug via INTRAVENOUS
  Administered 2015-04-01: 60 ug via INTRAVENOUS
  Administered 2015-04-01: 45 ug via INTRAVENOUS
  Administered 2015-04-02: via INTRAVENOUS
  Administered 2015-04-02: 102.3 ug via INTRAVENOUS
  Administered 2015-04-02: 45 ug via INTRAVENOUS
  Administered 2015-04-02: 120 ug via INTRAVENOUS
  Administered 2015-04-02: 162.5 ug via INTRAVENOUS
  Administered 2015-04-02: 120 ug via INTRAVENOUS
  Administered 2015-04-03: 90 ug via INTRAVENOUS
  Administered 2015-04-03 (×2): 60 ug via INTRAVENOUS
  Administered 2015-04-03: 90 ug via INTRAVENOUS
  Administered 2015-04-03: 45 ug via INTRAVENOUS
  Filled 2015-03-28 (×9): qty 50

## 2015-03-28 MED ORDER — GLUCERNA SHAKE PO LIQD
237.0000 mL | Freq: Three times a day (TID) | ORAL | Status: DC
  Administered 2015-04-02 – 2015-04-04 (×2): 237 mL via ORAL

## 2015-03-28 MED ORDER — CLOPIDOGREL BISULFATE 75 MG PO TABS
75.0000 mg | ORAL_TABLET | Freq: Every day | ORAL | Status: DC
  Administered 2015-03-28 – 2015-04-13 (×17): 75 mg via ORAL
  Filled 2015-03-28 (×17): qty 1

## 2015-03-28 NOTE — Progress Notes (Signed)
Patient ID: Tomiko Schoon, female   DOB: January 27, 1961, 54 y.o.   MRN: [id number] TCTS DAILY ICU PROGRESS NOTE                   [address]            [place] [address]          [phone]   3 Days Post-Op Procedure(s) (LRB): VIDEO BRONCHOSCOPY (N/A) VIDEO ASSISTED THORACOSCOPY (VATS)/LUNG RESECTION (Right)  Total Length of Stay:  LOS: 3 days   Subjective: awake and alert , walking in unit   Objective: Vital signs in last 24 hours: Temp:  [97.7 F (36.5 C)-99.1 F (37.3 C)] 97.7 F (36.5 C) (09/12 0400) Pulse Rate:  [74-138] 77 (09/12 0700) Cardiac Rhythm:  [-] Sinus tachycardia (09/12 0400) Resp:  [10-30] 11 (09/12 0700) BP: (93-154)/(57-91) 97/61 mmHg (09/12 0700) SpO2:  [52 %-100 %] 98 % (09/12 0700)  Filed Weights   03/25/15 0604 03/26/15 0500 03/27/15 0600  Weight: 102 lb (46.267 kg) 105 lb 12.8 oz (47.991 kg) 104 lb 0.9 oz (47.2 kg)    Weight change:    Hemodynamic parameters for last 24 hours:    Intake/Output from previous day: 09/11 0701 - 09/12 0700 In: 2530.7 [P.O.:1320; I.V.:1110.7; IV Piggyback:100] Out: 1855 [IOXBD:5329; Chest Tube:280]  Intake/Output this shift:    Current Meds: Scheduled Meds: . acetaminophen  1,000 mg Oral 4 times per day   Or  . acetaminophen (TYLENOL) oral liquid 160 mg/5 mL  1,000 mg Oral 4 times per day  . aspirin EC  81 mg Oral Daily  . bisacodyl  10 mg Oral Daily  . budesonide  0.5 mg Nebulization BID  . fentaNYL   Intravenous 6 times per day  . Fluticasone Furoate-Vilanterol  1 puff Inhalation Daily  . ipratropium  0.5 mg Nebulization QID  . potassium chloride  10 mEq Intravenous Q1 Hr x 3  . senna-docusate  1 tablet Oral QHS   Continuous Infusions: . bupivacaine ON-Q pain pump 1 each (03/25/15 1630)  . dextrose 5 % and 0.45% NaCl 10 mL/hr at 03/27/15 1900   PRN Meds:.albuterol, ALPRAZolam, diphenhydrAMINE **OR** diphenhydrAMINE, HYDROcodone-acetaminophen, naloxone **AND** sodium chloride,  ondansetron (ZOFRAN) IV, potassium chloride  General appearance: alert, cooperative and no distress Neurologic: intact Heart: regular rate and rhythm, S1, S2 normal, no murmur, click, rub or gallop Lungs: diminished breath sounds bibasilar Abdomen: soft, non-tender; bowel sounds normal; no masses,  no organomegaly Extremities: extremities normal, atraumatic, no cyanosis or edema and Homans sign is negative, no sign of DVT Wound: intact  Lab Results: CBC: Recent Labs  03/27/15 0318 03/28/15 0414  WBC 6.4 6.5  HGB 8.1* 8.0*  HCT 24.5* 24.4*  PLT 199 206   BMET:  Recent Labs  03/27/15 0318 03/28/15 0414  NA 139 139  K 3.4* 3.6  CL 108 107  CO2 29 28  GLUCOSE 98 87  BUN 5* <5*  CREATININE 0.55 0.45  CALCIUM 8.3* 8.4*    PT/INR: No results for input(s): LABPROT, INR in the last 72 hours. Radiology: No results found. Apical ptx, will increase suction , moderate air leak with coughing   Assessment/Plan: S/P Procedure(s) (LRB): VIDEO BRONCHOSCOPY (N/A) VIDEO ASSISTED THORACOSCOPY (VATS)/LUNG RESECTION (Right) Mobilize Leave chest tubes  Path pending     Grace Isaac 03/28/2015 7:45 AM

## 2015-03-28 NOTE — Op Note (Signed)
NAMEJENY, NIELD NO.:  [id number]  MEDICAL RECORD NO.:  [id number]  LOCATION:  2S15C                        FACILITY:  [facility]  PHYSICIAN:  Lanelle Bal, MD    DATE OF BIRTH:  28-Jun-1961  DATE OF PROCEDURE:  03/25/2015 DATE OF DISCHARGE:                              OPERATIVE REPORT   PREOPERATIVE DIAGNOSIS:  Adenocarcinoma, right upper lobe, status post chemotherapy.  POSTOPERATIVE DIAGNOSIS:  Adenocarcinoma, right upper lobe, status post chemotherapy.  SURGICAL PROCEDURE: 1. Video bronchoscopy. 2. Right video-assisted thoracoscopy with right upper lobectomy. 3. Lymph node dissection. 4. Placement of ON-Q.  SURGEON:  Lanelle Bal, M.D.  FIRST ASSISTANT:  E Barrett, PA.  BRIEF HISTORY:  The patient is a 54 year old patient, with biopsy-proven adenocarcinoma of the right upper lobe.  A PET scan suggested possible stage IIIa carcinoma.  The patient had been treated in [place] and a course of chemotherapy was given along with aggressive attempts at smoking cessation.  Followup CT scan showed reduction in size of the primary tumor in the right upper lobe.  In addition, significant decrease in size of the right hilar nodes.  After that, the patient was referred for resection, with the positive response from the chemotherapy.  Pulmonary function studies were adequate, in spite of her low weight and continued smoking, though she had decreased to just 1 or 2 cigarettes a day.  Risks and options were discussed with the patient in detail.  She had known previous myocardial infarction in 2010. Followup cardiac evaluation by Dr. Bettina Gavia prior to surgery indicated a return of LV function and without evidence of ischemia.  DESCRIPTION OF PROCEDURE:  The patient underwent general endotracheal anesthesia without incident.  A single-lumen endotracheal tube was placed through the single-lumen endotracheal tube.  A fiberoptic bronchoscope was passed to the  subsegmental level, both on the right and left tracheobronchial tree.  There was no evidence of endobronchial lesions appreciated.  The scope was removed.  Double-lumen endotracheal tube was then placed.  The patient was then turned in lateral decubitus position with the right side up.  The skin of the right chest was prepped with Betadine and draped in sterile manner.  A second time-out was performed with the right side marked.  The right lung was collapsed. We proceeded with a small port incision, approximately 4th intercostal space.  The adhesions in the apex of the lung, possibly related to the previous needle biopsy, but there was no evidence of pleural spread of tumor.  We then proceeded with additional port site anteriorly and posteriorly, enlarging the initial incision slightly through this and through the ports.  We progressed procedure with taking down adhesions, freeing up the right upper lobe.  The minor fissure was incomplete.  The major fissure was more completed.  The superior pulmonary vein was identified and circled and when isolated a vascular stapler was placed across the right superior pulmonary vein and the vein was divided.  We then proceeded with isolating 3 additional right upper lobe arterial branches each, which were individually divided with the vascular stapler.  The dissection carried was continued around the right upper lobe bronchus.  A green stapler was passed across the right upper lobe bronchus.  Prior to firing the stapler, the right lung was ventilated. There was ventilation of both middle and lower lobe.  The right upper lobe bronchus was divided.  We then completed the fissures with serial firings of 10 stapler.  The right lung was ventilated.  The middle and lower lobe inflated nicely.  Saline was placed into the chest and pressure increased to 30 without evidence of bronchial air leak.  ProGEL was placed on the cut edges of the fissure and the  bronchial stump. Prior to this, we dissected out 4R, 2R, 10R, 11R, and 8 lymph nodes and labeled appropriately and submitted to Pathology.  The specimen had been removed in the specimen bag and submitted to Pathology.  The bronchial and vascular margins were negative for tumor.  ProGEL was placed on the bronchial stump and the cut edges of the fissures after the lymph node dissection was completed.  An ON-Q device was tunneled subpleurally and posteriorly to help assist in pain control of the port and chest tube sites.  A Blake drain was placed posteriorly and a standard 28 chest tube was placed anteriorly through the previous port sites, each of these were closed.  The working incision was then closed with pericostal sutures with the inferior rib drilled.  The muscle layers were closed with interrupted 0 Vicryl, running 2-0 Vicryl in the muscle and subcutaneous layers with 3-0 subcuticular stitch and skin edges. Dermabond was applied.  At the completion of the case, the lung inflated well.  There was a small air leak present initially.  The patient was awakened.  The sponge and needle count was reported as correct at the completion of procedure.  The patient tolerated the procedure without obvious complication.  She was extubated in the operating room and transferred to the recovery room for further postoperative care. Estimated blood loss was approximately 200-250 mL.     Lanelle Bal, MD     EG/MEDQ  D:  03/27/2015  T:  03/27/2015  Job:  [id number]  cc:   Marice Potter, MD

## 2015-03-28 NOTE — Progress Notes (Signed)
Initial Nutrition Assessment  DOCUMENTATION CODES:   Not applicable  INTERVENTION:    Magic cup TID with meals, each supplement provides 290 kcal and 9 grams of protein  Vanilla Pudding BID between meals.  NUTRITION DIAGNOSIS:   Increased nutrient needs related to chronic illness as evidenced by estimated needs.  GOAL:   Patient will meet greater than or equal to 90% of their needs  MONITOR:   PO intake, Supplement acceptance, Labs, Weight trends  REASON FOR ASSESSMENT:   Malnutrition Screening Tool    ASSESSMENT:   54 yo female admitted with lung CA s/p chemotherapy. S/P VATS with right upper lobectomy and lymph node dissection on 9/9.  Nutrition-Focused physical exam completed. Findings are no fat depletion, mild-moderate muscle depletion, and no edema. Patient reports recent weight loss during chemotherapy treatment. She usually weighs 107 lbs, got down to 93 lbs, now back up to ~104 lbs. She does not like Ensure or Boost supplements. Agreed to try magic cups with meals and pudding between meals to maximize oral intake. She has been consuming 25-50% of meals.   Diet Order:  Diet Heart Room service appropriate?: Yes; Fluid consistency:: Thin  Skin:  Reviewed, no issues  Last BM:  9/11  Height:   Ht Readings from Last 1 Encounters:  03/25/15 '5\' 2"'$  (1.575 m)    Weight:   Wt Readings from Last 1 Encounters:  03/27/15 104 lb 0.9 oz (47.2 kg)    Ideal Body Weight:  50 kg  BMI:  Body mass index is 19.03 kg/(m^2).  Estimated Nutritional Needs:   Kcal:  1550-1750  Protein:  75-85 gm  Fluid:  1.6-1.8 L  EDUCATION NEEDS:   Education needs addressed  Molli Barrows, [place], [place], [place] Pager [phone] After Hours Pager [phone]

## 2015-03-28 NOTE — Progress Notes (Signed)
Patient ID: Michelle King, female   DOB: 1960/11/08, 54 y.o.   MRN: [id number] EVENING ROUNDS NOTE :     [address]       ,[place] [address]             [phone]                 3 Days Post-Op Procedure(s) (LRB): VIDEO BRONCHOSCOPY (N/A) VIDEO ASSISTED THORACOSCOPY (VATS)/LUNG RESECTION (Right)  Total Length of Stay:  LOS: 3 days  BP 111/66 mmHg  Pulse 98  Temp(Src) 99.7 F (37.6 C) (Oral)  Resp 20  Ht '5\' 2"'$  (1.575 m)  Wt 104 lb 0.9 oz (47.2 kg)  BMI 19.03 kg/m2  SpO2 92%  .Intake/Output      09/12 0701 - 09/13 0700   P.O. 600   I.V. (mL/kg) 110 (2.3)   IV Piggyback 150   Total Intake(mL/kg) 860 (18.2)   Urine (mL/kg/hr) 1800 (3.1)   Stool    Chest Tube 140 (0.2)   Total Output 1940   Net -1080         . dextrose 5 % and 0.45% NaCl 10 mL/hr at 03/28/15 1800     Lab Results  Component Value Date   WBC 6.5 03/28/2015   HGB 8.0* 03/28/2015   HCT 24.4* 03/28/2015   PLT 206 03/28/2015   GLUCOSE 87 03/28/2015   CHOL  04/19/2009    190        ATP III CLASSIFICATION:  <200     mg/dL   Desirable  200-239  mg/dL   Borderline High  >=240    mg/dL   High        SLIGHT HEMOLYSIS   TRIG 116 SLIGHT HEMOLYSIS 04/19/2009   HDL 36 SLIGHT HEMOLYSIS* 04/19/2009   LDLCALC * 04/19/2009    131        Total Cholesterol/HDL:CHD Risk Coronary Heart Disease Risk Table                     Men   Women  1/2 Average Risk   3.4   3.3  Average Risk       5.0   4.4  2 X Average Risk   9.6   7.1  3 X Average Risk  23.4   11.0        Use the calculated Patient Ratio above and the CHD Risk Table to determine the patient's CHD Risk.        ATP III CLASSIFICATION (LDL):  <100     mg/dL   Optimal  100-129  mg/dL   Near or Above                    Optimal  130-159  mg/dL   Borderline  160-189  mg/dL   High  >190     mg/dL   Very High   ALT 11* 03/27/2015   AST 22 03/27/2015   NA 139 03/28/2015   K 3.6 03/28/2015   CL 107 03/28/2015   CREATININE 0.45  03/28/2015   BUN <5* 03/28/2015   CO2 28 03/28/2015   TSH 1.539Test methodology is 3rd generation TSH 04/19/2009   INR 1.03 03/23/2015   restarted pca pump as had more pain after stopping today  Grace Isaac MD  Beeper [phone] Office [phone] 03/28/2015 7:19 PM

## 2015-03-29 ENCOUNTER — Inpatient Hospital Stay (HOSPITAL_COMMUNITY): Payer: Medicaid Other

## 2015-03-29 MED ORDER — FLUTICASONE FUROATE-VILANTEROL 200-25 MCG/INH IN AEPB
1.0000 | INHALATION_SPRAY | Freq: Every day | RESPIRATORY_TRACT | Status: DC
  Administered 2015-03-29 – 2015-04-03 (×2): 1 via RESPIRATORY_TRACT
  Administered 2015-04-09: 21:00:00 via RESPIRATORY_TRACT
  Administered 2015-04-12 – 2015-04-17 (×3): 1 via RESPIRATORY_TRACT
  Administered 2015-04-18: 21:00:00 via RESPIRATORY_TRACT
  Administered 2015-04-22 – 2015-04-27 (×5): 1 via RESPIRATORY_TRACT
  Administered 2015-04-29 – 2015-04-30 (×2): 200 ug via RESPIRATORY_TRACT
  Administered 2015-05-03 – 2015-05-04 (×2): 1 via RESPIRATORY_TRACT

## 2015-03-29 MED ORDER — IPRATROPIUM BROMIDE 0.02 % IN SOLN
0.5000 mg | Freq: Four times a day (QID) | RESPIRATORY_TRACT | Status: DC
  Administered 2015-03-30 – 2015-05-05 (×140): 0.5 mg via RESPIRATORY_TRACT
  Filled 2015-03-29 (×141): qty 2.5

## 2015-03-29 MED ORDER — DIPHENHYDRAMINE HCL 12.5 MG/5ML PO ELIX
12.5000 mg | ORAL_SOLUTION | Freq: Four times a day (QID) | ORAL | Status: DC | PRN
  Administered 2015-03-29 – 2015-04-03 (×4): 12.5 mg via ORAL
  Filled 2015-03-29 (×5): qty 5

## 2015-03-29 MED ORDER — FOLIC ACID 1 MG PO TABS
1.0000 mg | ORAL_TABLET | Freq: Every day | ORAL | Status: DC
  Administered 2015-03-29 – 2015-05-05 (×37): 1 mg via ORAL
  Filled 2015-03-29 (×38): qty 1

## 2015-03-29 NOTE — Progress Notes (Signed)
Patient ID: Michelle King, female   DOB: 1961-07-01, 54 y.o.   MRN: [id number] TCTS DAILY ICU PROGRESS NOTE                   [address]            [place] [address]          [phone]   4 Days Post-Op Procedure(s) (LRB): VIDEO BRONCHOSCOPY (N/A) VIDEO ASSISTED THORACOSCOPY (VATS)/LUNG RESECTION (Right)  Total Length of Stay:  LOS: 4 days   Subjective: Walking some last pm, good effort  Objective: Vital signs in last 24 hours: Temp:  [97.6 F (36.4 C)-99.7 F (37.6 C)] 98 F (36.7 C) (09/13 0721) Pulse Rate:  [25-113] 108 (09/13 0800) Cardiac Rhythm:  [-] Normal sinus rhythm (09/13 0800) Resp:  [9-26] 21 (09/13 0800) BP: (85-144)/(57-92) 107/70 mmHg (09/13 0800) SpO2:  [90 %-99 %] 92 % (09/13 0822) Weight:  [102 lb 1.2 oz (46.3 kg)] 102 lb 1.2 oz (46.3 kg) (09/13 0600)  Filed Weights   03/26/15 0500 03/27/15 0600 03/29/15 0600  Weight: 105 lb 12.8 oz (47.991 kg) 104 lb 0.9 oz (47.2 kg) 102 lb 1.2 oz (46.3 kg)    Weight change:    Hemodynamic parameters for last 24 hours:    Intake/Output from previous day: 09/12 0701 - 09/13 0700 In: 990 [P.O.:600; I.V.:240; IV Piggyback:150] Out: 2725 [Urine:2800; Chest Tube:230]  Intake/Output this shift: Total I/O In: 10 [I.V.:10] Out: 10 [Chest Tube:10]  Current Meds: Scheduled Meds: . acetaminophen  1,000 mg Oral 4 times per day   Or  . acetaminophen (TYLENOL) oral liquid 160 mg/5 mL  1,000 mg Oral 4 times per day  . aspirin EC  81 mg Oral Daily  . bisacodyl  10 mg Oral Daily  . budesonide  0.5 mg Nebulization BID  . clopidogrel  75 mg Oral Daily  . enoxaparin (LOVENOX) injection  30 mg Subcutaneous Q24H  . feeding supplement (GLUCERNA SHAKE)  237 mL Oral TID BM  . fentaNYL   Intravenous 6 times per day  . Fluticasone Furoate-Vilanterol  1 puff Inhalation Daily  . folic acid  1 mg Oral Daily  . ipratropium  0.5 mg Nebulization QID  . senna-docusate  1 tablet Oral QHS   Continuous  Infusions: . dextrose 5 % and 0.45% NaCl 10 mL/hr at 03/28/15 1800   PRN Meds:.albuterol, ALPRAZolam, diphenhydrAMINE, HYDROcodone-acetaminophen, potassium chloride  General appearance: alert, cooperative and no distress Neurologic: intact Heart: regular rate and rhythm, S1, S2 normal, no murmur, click, rub or gallop Lungs: diminished breath sounds bibasilar Abdomen: soft, non-tender; bowel sounds normal; no masses,  no organomegaly Extremities: extremities normal, atraumatic, no cyanosis or edema and Homans sign is negative, no sign of DVT Wound: air leak with cough, good tidling  Lab Results: CBC: Recent Labs  03/27/15 0318 03/28/15 0414  WBC 6.4 6.5  HGB 8.1* 8.0*  HCT 24.5* 24.4*  PLT 199 206   BMET:  Recent Labs  03/27/15 0318 03/28/15 0414  NA 139 139  K 3.4* 3.6  CL 108 107  CO2 29 28  GLUCOSE 98 87  BUN 5* <5*  CREATININE 0.55 0.45  CALCIUM 8.3* 8.4*    PT/INR: No results for input(s): LABPROT, INR in the last 72 hours. Radiology: Dg Chest Port 1 View  03/29/2015   CLINICAL DATA:  Right lung surgery.  EXAM: PORTABLE CHEST - 1 VIEW  COMPARISON:  03/28/2015 .  FINDINGS: Right IJ line and  2 right chest tubes in stable position. Interim near complete resolution of right pneumothorax with minimal residual. Mediastinum hilar structures stable. Heart size stable. Left base subsegmental atelectasis. Right chest wall subcutaneous emphysema again noted. No acute bony abnormality.  IMPRESSION: 1. Right IJ line and 2 right chest tubes in stable position. Interim near complete resolution of small right apical pneumothorax. Minimal residual. Right chest wall subcutaneous emphysema is stable. 2. Left base subsegmental atelectasis.   Electronically Signed   By: Marcello Moores  Register   On: 03/29/2015 07:50     Assessment/Plan: S/P Procedure(s) (LRB): VIDEO BRONCHOSCOPY (N/A) VIDEO ASSISTED THORACOSCOPY (VATS)/LUNG RESECTION (Right) Mobilize Ct to water seal, leave tubes with air  leak Path still pending   Grace Isaac 03/29/2015 10:38 AM

## 2015-03-29 NOTE — Progress Notes (Signed)
TCTS BRIEF SICU PROGRESS NOTE  4 Days Post-Op  S/P Procedure(s) (LRB): VIDEO BRONCHOSCOPY (N/A) VIDEO ASSISTED THORACOSCOPY (VATS)/LUNG RESECTION (Right)   Stable day NSR-sinus tach w/ stable BP O2 sats 95% on RA  Plan: Continue current plan.  Rexene Alberts 03/29/2015 5:56 PM

## 2015-03-30 ENCOUNTER — Inpatient Hospital Stay (HOSPITAL_COMMUNITY): Payer: Medicaid Other

## 2015-03-30 LAB — BASIC METABOLIC PANEL
Anion gap: 8 (ref 5–15)
BUN: 8 mg/dL (ref 6–20)
CO2: 26 mmol/L (ref 22–32)
Calcium: 8.8 mg/dL — ABNORMAL LOW (ref 8.9–10.3)
Chloride: 103 mmol/L (ref 101–111)
Creatinine, Ser: 0.52 mg/dL (ref 0.44–1.00)
GFR calc Af Amer: >60 mL/min (ref >60)
GFR calc non Af Amer: >60 mL/min (ref >60)
Glucose, Bld: 110 mg/dL — ABNORMAL HIGH (ref 65–99)
Potassium: 4 mmol/L (ref 3.5–5.1)
Sodium: 137 mmol/L (ref 135–145)

## 2015-03-30 LAB — CBC
HCT: 27.3 % — ABNORMAL LOW (ref 36.0–46.0)
Hemoglobin: 9 g/dL — ABNORMAL LOW (ref 12.0–15.0)
MCH: 30 pg (ref 26.0–34.0)
MCHC: 33 g/dL (ref 30.0–36.0)
MCV: 91 fL (ref 78.0–100.0)
Platelets: 264 10*3/uL (ref 150–400)
RBC: 3 MIL/uL — ABNORMAL LOW (ref 3.87–5.11)
RDW: 13.7 % (ref 11.5–15.5)
WBC: 10 10*3/uL (ref 4.0–10.5)

## 2015-03-30 MED ORDER — TRAMADOL HCL 50 MG PO TABS
50.0000 mg | ORAL_TABLET | Freq: Four times a day (QID) | ORAL | Status: DC | PRN
  Administered 2015-03-30 – 2015-04-01 (×4): 100 mg via ORAL
  Administered 2015-04-02: 50 mg via ORAL
  Administered 2015-04-02 – 2015-04-15 (×10): 100 mg via ORAL
  Administered 2015-04-18: 50 mg via ORAL
  Administered 2015-04-19: 100 mg via ORAL
  Administered 2015-04-19: 50 mg via ORAL
  Administered 2015-04-20 – 2015-04-23 (×8): 100 mg via ORAL
  Administered 2015-04-25 – 2015-04-26 (×3): 50 mg via ORAL
  Administered 2015-04-27 – 2015-04-30 (×5): 100 mg via ORAL
  Administered 2015-05-01: 50 mg via ORAL
  Administered 2015-05-01: 100 mg via ORAL
  Administered 2015-05-02: 50 mg via ORAL
  Administered 2015-05-02: 100 mg via ORAL
  Administered 2015-05-02 (×2): 50 mg via ORAL
  Filled 2015-03-30 (×5): qty 2
  Filled 2015-03-30 (×2): qty 1
  Filled 2015-03-30 (×5): qty 2
  Filled 2015-03-30: qty 1
  Filled 2015-03-30 (×10): qty 2
  Filled 2015-03-30: qty 1
  Filled 2015-03-30 (×4): qty 2
  Filled 2015-03-30 (×2): qty 1
  Filled 2015-03-30: qty 2
  Filled 2015-03-30: qty 1
  Filled 2015-03-30 (×8): qty 2

## 2015-03-30 NOTE — Progress Notes (Addendum)
TCTS DAILY ICU PROGRESS NOTE                   [address]            [place] [address]          [phone]   5 Days Post-Op Procedure(s) (LRB): VIDEO BRONCHOSCOPY (N/A) VIDEO ASSISTED THORACOSCOPY (VATS)/LUNG RESECTION (Right)  Total Length of Stay:  LOS: 5 days   Subjective: Feels well, no complaints except a little soreness in her back. Walking in hall without difficulty, breathing stable.   Objective: Vital signs in last 24 hours: Temp:  [98.2 F (36.8 C)-99 F (37.2 C)] 98.2 F (36.8 C) (09/14 0759) Pulse Rate:  [80-127] 100 (09/14 0700) Cardiac Rhythm:  [-] Sinus tachycardia (09/14 0400) Resp:  [13-24] 16 (09/14 0700) BP: (84-116)/(55-81) 87/62 mmHg (09/14 0700) SpO2:  [81 %-98 %] 93 % (09/14 0757)  Filed Weights   03/26/15 0500 03/27/15 0600 03/29/15 0600  Weight: 105 lb 12.8 oz (47.991 kg) 104 lb 0.9 oz (47.2 kg) 102 lb 1.2 oz (46.3 kg)    Weight change:    Hemodynamic parameters for last 24 hours:    Intake/Output from previous day: 09/13 0701 - 09/14 0700 In: 950 [P.O.:720; I.V.:230] Out: 1465 [Urine:1375; Chest Tube:90]  Intake/Output this shift:    Current Meds: Scheduled Meds: . acetaminophen  1,000 mg Oral 4 times per day   Or  . acetaminophen (TYLENOL) oral liquid 160 mg/5 mL  1,000 mg Oral 4 times per day  . aspirin EC  81 mg Oral Daily  . bisacodyl  10 mg Oral Daily  . budesonide  0.5 mg Nebulization BID  . clopidogrel  75 mg Oral Daily  . enoxaparin (LOVENOX) injection  30 mg Subcutaneous Q24H  . feeding supplement (GLUCERNA SHAKE)  237 mL Oral TID BM  . fentaNYL   Intravenous 6 times per day  . Fluticasone Furoate-Vilanterol  1 puff Inhalation Daily  . folic acid  1 mg Oral Daily  . ipratropium  0.5 mg Nebulization QID  . senna-docusate  1 tablet Oral QHS   Continuous Infusions: . dextrose 5 % and 0.45% NaCl 10 mL/hr at 03/28/15 1800   PRN Meds:.albuterol, ALPRAZolam, diphenhydrAMINE, HYDROcodone-acetaminophen,  potassium chloride  Physical Exam: General appearance: alert, cooperative and no distress Heart: regular rate and rhythm Lungs: Slightly decreased BS on R, overall clear Wound: Clean and dry Chest tube: + air leak with cough  Lab Results: CBC: Recent Labs  03/28/15 0414 03/30/15 0424  WBC 6.5 10.0  HGB 8.0* 9.0*  HCT 24.4* 27.3*  PLT 206 264   BMET:  Recent Labs  03/28/15 0414 03/30/15 0424  NA 139 137  K 3.6 4.0  CL 107 103  CO2 28 26  GLUCOSE 87 110*  BUN <5* 8  CREATININE 0.45 0.52  CALCIUM 8.4* 8.8*    PT/INR: No results for input(s): LABPROT, INR in the last 72 hours. Radiology: Dg Chest Port 1 View  03/30/2015   CLINICAL DATA:  Video-assisted bronchoscopy  EXAM: PORTABLE CHEST - 1 VIEW  COMPARISON:  Yesterday  FINDINGS: Two right chest tubes remain, however the pneumothorax on the right has markedly enlarged canal is approximately 40%. Stable appearance of the lungs otherwise. Normal heart size. There is developing shift of the mediastinum to the left.  IMPRESSION: Right pneumothorax has markedly enlarged and is now 40%. There is slight shift in the mediastinum the left suggesting an element of tension. Critical Value/emergent  results were called by telephone at the time of interpretation on 03/30/2015 at 7:59 am to Dr. Benjaman Pott, RN, who verbally acknowledged these results.   Electronically Signed   By: Marybelle Killings M.D.   On: 03/30/2015 08:00     Assessment/Plan: S/P Procedure(s) (LRB): VIDEO BRONCHOSCOPY (N/A) VIDEO ASSISTED THORACOSCOPY (VATS)/LUNG RESECTION (Right) CT with large air leak with cough, CXR with increased pneumothorax. Will return CT to suction and watch. Pulm- continue home inhalers, nebs, pulm toilet. Wean O2 as able. Continue ambulation. Awaiting tx stepdown.   [name],[name] 03/30/2015 8:03 AM  On water seal increased ptx, back on suction , leave both chest tubes I have seen and examined Michelle King and agree with the above  assessment  and plan.  Grace Isaac MD Beeper [phone] Office [phone] 03/30/2015 8:27 AM

## 2015-03-30 NOTE — Progress Notes (Signed)
Pt transferred to 3S11 with belongings. Report given to receiving RN and all questions answered. VSS during transfer.  Pt assisted to bed in new room. Stephenson hooked to suction.  Family at bedside.

## 2015-03-31 ENCOUNTER — Inpatient Hospital Stay (HOSPITAL_COMMUNITY): Payer: Medicaid Other

## 2015-03-31 LAB — BASIC METABOLIC PANEL
Anion gap: 10 (ref 5–15)
BUN: 11 mg/dL (ref 6–20)
CO2: 26 mmol/L (ref 22–32)
Calcium: 9.2 mg/dL (ref 8.9–10.3)
Chloride: 100 mmol/L — ABNORMAL LOW (ref 101–111)
Creatinine, Ser: 0.5 mg/dL (ref 0.44–1.00)
GFR calc Af Amer: >60 mL/min (ref >60)
GFR calc non Af Amer: >60 mL/min (ref >60)
Glucose, Bld: 121 mg/dL — ABNORMAL HIGH (ref 65–99)
Potassium: 4.1 mmol/L (ref 3.5–5.1)
Sodium: 136 mmol/L (ref 135–145)

## 2015-03-31 LAB — CBC
HCT: 29.3 % — ABNORMAL LOW (ref 36.0–46.0)
Hemoglobin: 9.7 g/dL — ABNORMAL LOW (ref 12.0–15.0)
MCH: 30 pg (ref 26.0–34.0)
MCHC: 33.1 g/dL (ref 30.0–36.0)
MCV: 90.7 fL (ref 78.0–100.0)
Platelets: 333 10*3/uL (ref 150–400)
RBC: 3.23 MIL/uL — ABNORMAL LOW (ref 3.87–5.11)
RDW: 13.9 % (ref 11.5–15.5)
WBC: 6.8 10*3/uL (ref 4.0–10.5)

## 2015-03-31 NOTE — Progress Notes (Addendum)
      [address]       [place] [address]             [phone]      6 Days Post-Op Procedure(s) (LRB): VIDEO BRONCHOSCOPY (N/A) VIDEO ASSISTED THORACOSCOPY (VATS)/LUNG RESECTION (Right)   Subjective:  Mr. Michelle King complains of incisional pain today.  She ambulates without difficulty.  Husband at bedside with multiple concerns, all were addressed.  Objective: Vital signs in last 24 hours: Temp:  [98.1 F (36.7 C)-98.3 F (36.8 C)] 98.2 F (36.8 C) (09/15 0340) Pulse Rate:  [91-119] 98 (09/15 0345) Cardiac Rhythm:  [-] Normal sinus rhythm (09/15 0345) Resp:  [12-25] 20 (09/15 0345) BP: (95-132)/(68-81) 115/76 mmHg (09/15 0345) SpO2:  [90 %-100 %] 98 % (09/15 0928)  Intake/Output from previous day: 09/14 0701 - 09/15 0700 In: 758.3 [P.O.:600; I.V.:158.3] Out: 380 [Urine:350; Chest Tube:30]  General appearance: alert, cooperative and no distress Heart: regular rate and rhythm Lungs: clear to auscultation bilaterally Abdomen: soft, non-tender; bowel sounds normal; no masses,  no organomegaly Wound: clean and dry  Lab Results:  Recent Labs  03/30/15 0424 03/31/15 0345  WBC 10.0 6.8  HGB 9.0* 9.7*  HCT 27.3* 29.3*  PLT 264 333   BMET:  Recent Labs  03/30/15 0424 03/31/15 0345  NA 137 136  K 4.0 4.1  CL 103 100*  CO2 26 26  GLUCOSE 110* 121*  BUN 8 11  CREATININE 0.52 0.50  CALCIUM 8.8* 9.2    PT/INR: No results for input(s): LABPROT, INR in the last 72 hours. ABG    Component Value Date/Time   PHART 7.390 03/26/2015 0415   HCO3 25.8* 03/26/2015 0415   TCO2 27.1 03/26/2015 0415   ACIDBASEDEF 0.2 03/23/2015 1353   O2SAT 99.1 03/26/2015 0415   CBG (last 3)  No results for input(s): GLUCAP in the last 72 hours.  Assessment/Plan: S/P Procedure(s) (LRB): VIDEO BRONCHOSCOPY (N/A) VIDEO ASSISTED THORACOSCOPY (VATS)/LUNG RESECTION (Right)  1. Chest tube- put back on suction yesterday, CXR shows reexpansion of lung- chest tube  with continued air leak- leave on suction until resolved 2. Pulm- continue home COPD regimen, wean oxygen as tolerated 3. Dispo- patient stable, repeat CXR in AM, CT on suction   LOS: 6 days    BARRETT, ERIN 03/31/2015  Chest xray improved today, air leak still present but decreased  Leave chest tubes for now No sign of infection I have seen and examined Ned Card and agree with the above assessment  and plan.  Grace Isaac MD Beeper [phone] Office [phone] 03/31/2015 8:12 PM

## 2015-04-01 ENCOUNTER — Inpatient Hospital Stay (HOSPITAL_COMMUNITY): Payer: Medicaid Other

## 2015-04-01 NOTE — Progress Notes (Addendum)
      [address]       [place] [address]             [phone]      7 Days Post-Op Procedure(s) (LRB): VIDEO BRONCHOSCOPY (N/A) VIDEO ASSISTED THORACOSCOPY (VATS)/LUNG RESECTION (Right)   Subjective:  Ms. Mattila continues to have pain at anterior chest tube site.  She ambulated yesterday with minimal difficulty.  However, she again has a pneumothorax on the right side, which was not present yesterday.  Objective: Vital signs in last 24 hours: Temp:  [97.5 F (36.4 C)-98.3 F (36.8 C)] 98.1 F (36.7 C) (09/16 0454) Pulse Rate:  [80-100] 80 (09/16 0454) Cardiac Rhythm:  [-] Normal sinus rhythm (09/15 2004) Resp:  [16-25] 16 (09/16 0454) BP: (92-114)/(52-70) 92/63 mmHg (09/16 0454) SpO2:  [91 %-100 %] 91 % (09/16 0735)  Intake/Output from previous day: 09/15 0701 - 09/16 0700 In: 1210 [P.O.:960; I.V.:250] Out: 580 [Urine:400; Chest Tube:180]  General appearance: alert, cooperative and no distress Heart: regular rate and rhythm Lungs: clear to auscultation bilaterally Abdomen: soft, non-tender; bowel sounds normal; no masses,  no organomegaly Wound: clean and dry  Lab Results:  Recent Labs  03/30/15 0424 03/31/15 0345  WBC 10.0 6.8  HGB 9.0* 9.7*  HCT 27.3* 29.3*  PLT 264 333   BMET:  Recent Labs  03/30/15 0424 03/31/15 0345  NA 137 136  K 4.0 4.1  CL 103 100*  CO2 26 26  GLUCOSE 110* 121*  BUN 8 11  CREATININE 0.52 0.50  CALCIUM 8.8* 9.2    PT/INR: No results for input(s): LABPROT, INR in the last 72 hours. ABG    Component Value Date/Time   PHART 7.390 03/26/2015 0415   HCO3 25.8* 03/26/2015 0415   TCO2 27.1 03/26/2015 0415   ACIDBASEDEF 0.2 03/23/2015 1353   O2SAT 99.1 03/26/2015 0415   CBG (last 3)  No results for input(s): GLUCAP in the last 72 hours.  Assessment/Plan: S/P Procedure(s) (LRB): VIDEO BRONCHOSCOPY (N/A) VIDEO ASSISTED THORACOSCOPY (VATS)/LUNG RESECTION (Right)  1. Chest tube- 3-4+ air leak with  cough, this is worse than yesterday, CXR with redevelopment of pneumothorax- chest tube is on suction 2. Pulm- continue to wean oxygen as tolerated, continue IS 3. Dispo- leave chest tube on suction, encouraged nursing to not take off suction as much as possible, repeat CXR in AM   LOS: 7 days    BARRETT, ERIN 04/01/2015  Suction increased to 30 , now decrease air leak but from both tubes I have seen and examined Ned Card and agree with the above assessment  and plan.  Grace Isaac MD Beeper [phone] Office [phone] 04/01/2015 5:11 PM

## 2015-04-02 ENCOUNTER — Inpatient Hospital Stay (HOSPITAL_COMMUNITY): Payer: Medicaid Other

## 2015-04-02 NOTE — Care Management Note (Addendum)
Case Management Note  Patient Details  Name: Michelle King MRN: [id number] Date of Birth: Nov 28, 1960  Subjective/Objective:                 Admitted s/p VATS/ R lung resection. 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 Independent with ADL's.   Action/Plan:  Return to home when medically stable. CM to f/u with d/c needs. Expected Discharge Date:     unknown           Expected Discharge Plan:  Home/Self Care  In-House Referral:     Discharge planning Services  CM Consult  Post Acute Care Choice:    Choice offered to:     DME Arranged:    DME Agency:     HH Arranged:    HH Agency:     Status of Service:  In process, will continue to follow  Medicare Important Message Given:    Date Medicare IM Given:    Medicare IM give by:    Date Additional Medicare IM Given:    Additional Medicare Important Message give by:     If discussed at [place] of Stay Meetings, dates discussed:  04/07/15  Additional Comments:  04/18/15 s/p colonoscopy secondary to heme positive stools  04/11/18 Air leak post lobectomy, s/p VIDEO BRONCHOSCOPY WITH INSERTION OF INTERBRONCHIAL VALVES.  04/08/15 Air leak and ptx both increased  CT  back to -30 cm suction   Whitman Hero [employer], [place] [phone] 04/02/2015, 8:26 PM

## 2015-04-02 NOTE — Progress Notes (Signed)
UR COMPLETED  

## 2015-04-02 NOTE — Progress Notes (Addendum)
       [address]       [place] [address]             [phone]          8 Days Post-Op Procedure(s) (LRB): VIDEO BRONCHOSCOPY (N/A) VIDEO ASSISTED THORACOSCOPY (VATS)/LUNG RESECTION (Right)  Subjective: Sore, but otherwise doing well. Breathing stable.   Objective: Vital signs in last 24 hours: Patient Vitals for the past 24 hrs:  BP Temp Temp src Pulse Resp SpO2  04/02/15 1121 104/67 mmHg - - 84 16 100 %  04/02/15 0743 - 98.4 F (36.9 C) Oral - - 91 %  04/02/15 0700 99/62 mmHg - - 86 18 93 %  04/02/15 0447 116/66 mmHg 98.2 F (36.8 C) Oral 96 17 92 %  04/02/15 0408 - - - - 10 96 %  04/02/15 0400 104/68 mmHg 98.1 F (36.7 C) Oral 73 13 94 %  04/02/15 0000 119/74 mmHg 98.2 F (36.8 C) - 89 17 99 %  04/01/15 2357 - - - - 14 97 %  04/01/15 2004 - - - - 18 97 %  04/01/15 2000 108/67 mmHg 98.5 F (36.9 C) Oral 84 14 98 %  04/01/15 1959 108/67 mmHg - - 94 18 100 %  04/01/15 1954 - - - - - 100 %  04/01/15 1552 102/68 mmHg 97.8 F (36.6 C) Oral 94 (!) 23 100 %  04/01/15 1540 - - - - - 100 %  04/01/15 1215 105/61 mmHg 98 F (36.7 C) Oral 74 15 99 %  04/01/15 1157 - - - - - 100 %   Current Weight  03/29/15 102 lb 1.2 oz (46.3 kg)     Intake/Output from previous day: 09/16 0701 - 09/17 0700 In: 360 [P.O.:360] Out: 126 [Urine:1; Chest Tube:125]    PHYSICAL EXAM:  Heart: RRR Lungs: Few coarse BS bilaterally Wound: Clean and dry Chest tube: 2/7 air leak    Lab Results: CBC: Recent Labs  03/31/15 0345  WBC 6.8  HGB 9.7*  HCT 29.3*  PLT 333   BMET:  Recent Labs  03/31/15 0345  NA 136  K 4.1  CL 100*  CO2 26  GLUCOSE 121*  BUN 11  CREATININE 0.50  CALCIUM 9.2    PT/INR: No results for input(s): LABPROT, INR in the last 72 hours.  CXR: FINDINGS: Two right chest tubes remain directed towards the apex. No pneumothorax is evident. Right perihilar and suprahilar staple lines. Minimal subsegmental atelectasis or scarring  at the left lung base as before. Heart size normal. No effusion. Visualized skeletal structures are unremarkable. Right IJ central line to the distal SVC.  IMPRESSION: 1. Stable right chest tubes with no pneumothorax evident.   Assessment/Plan: S/P Procedure(s) (LRB): VIDEO BRONCHOSCOPY (N/A) VIDEO ASSISTED THORACOSCOPY (VATS)/LUNG RESECTION (Right) Decreased air leak with suction, CXR with improvement of ptx. Continue to -30 cm suction for now, may be able to decrease suction soon. Continue pulm toilet, ambulation.   LOS: 8 days    [name],[name] 04/02/2015  Chest xray improved with no ptx, air leak decreased I have seen and examined Ned Card and agree with the above assessment  and plan.  Grace Isaac MD Beeper [phone] Office [phone] 04/02/2015 1:42 PM

## 2015-04-03 ENCOUNTER — Inpatient Hospital Stay (HOSPITAL_COMMUNITY): Payer: Medicaid Other

## 2015-04-03 NOTE — Progress Notes (Signed)
Fentanyl pca 36m wasted in sink, witness deanna dillon rn and Delaine Hernandez rn

## 2015-04-03 NOTE — Progress Notes (Addendum)
       [address]       [place] [address]             [phone]          9 Days Post-Op Procedure(s) (LRB): VIDEO BRONCHOSCOPY (N/A) VIDEO ASSISTED THORACOSCOPY (VATS)/LUNG RESECTION (Right)  Subjective: Comfortable, no complaints.   Objective: Vital signs in last 24 hours: Patient Vitals for the past 24 hrs:  BP Temp Temp src Pulse Resp SpO2  04/03/15 0744 - - - - - 96 %  04/03/15 0400 - 97.9 F (36.6 C) Oral - - -  04/03/15 0347 133/77 mmHg - - (!) 115 19 (!) 88 %  04/03/15 0300 - - - 82 10 91 %  04/03/15 0004 - 97.8 F (36.6 C) Oral - - -  04/02/15 2339 (!) 104/57 mmHg - - (!) 107 17 91 %  04/02/15 2023 - - - - - 94 %  04/02/15 2013 92/71 mmHg - - (!) 101 19 92 %  04/02/15 2009 - 97.8 F (36.6 C) Oral - - -  04/02/15 1707 - - - - - 92 %  04/02/15 1500 113/62 mmHg 98.3 F (36.8 C) Oral 87 13 94 %  04/02/15 1144 - - - - - 99 %  04/02/15 1121 104/67 mmHg 98.4 F (36.9 C) Oral 84 16 100 %   Current Weight  03/29/15 102 lb 1.2 oz (46.3 kg)     Intake/Output from previous day: 09/17 0701 - 09/18 0700 In: 68 [P.O.:460; I.V.:510] Out: 830 [Urine:800; Chest Tube:30]    PHYSICAL EXAM:  Heart: RRR Lungs: Few coarse BS on R Wound: Clean and dry Chest tube: intermittent 1/7 air leak    Lab Results: CBC:No results for input(s): WBC, HGB, HCT, PLT in the last 72 hours. BMET: No results for input(s): NA, K, CL, CO2, GLUCOSE, BUN, CREATININE, CALCIUM in the last 72 hours.  PT/INR: No results for input(s): LABPROT, INR in the last 72 hours.   Assessment/Plan: S/P Procedure(s) (LRB): VIDEO BRONCHOSCOPY (N/A) VIDEO ASSISTED THORACOSCOPY (VATS)/LUNG RESECTION (Right) Air leak significantly improved today, still with intermittent 1/7 with cough. CXR stable with no obvious ptx. Possibly d/c 1 CT today and may be able to decrease suction. Continue ambulation, pulm toilet.   LOS: 9 days    King,Michelle H 04/03/2015  Lung inflated,  decreasing air leak D/c pca I have seen and examined Michelle King and agree with the above assessment  and plan.  Grace Isaac MD Beeper [phone] Office [phone] 04/03/2015 12:10 PM

## 2015-04-04 ENCOUNTER — Inpatient Hospital Stay (HOSPITAL_COMMUNITY): Payer: Medicaid Other

## 2015-04-04 MED ORDER — GLUCERNA SHAKE PO LIQD
237.0000 mL | Freq: Two times a day (BID) | ORAL | Status: DC
  Administered 2015-04-04 – 2015-04-09 (×5): 237 mL via ORAL

## 2015-04-04 MED ORDER — BOOST / RESOURCE BREEZE PO LIQD
1.0000 | ORAL | Status: DC
  Administered 2015-04-08 – 2015-04-09 (×2): 1 via ORAL

## 2015-04-04 NOTE — Progress Notes (Addendum)
       [address]       [place] [address]             [phone]          10 Days Post-Op Procedure(s) (LRB): VIDEO BRONCHOSCOPY (N/A) VIDEO ASSISTED THORACOSCOPY (VATS)/LUNG RESECTION (Right)  Subjective: Stable, no new complaints.   Objective: Vital signs in last 24 hours: Patient Vitals for the past 24 hrs:  BP Temp Temp src Pulse Resp SpO2  04/04/15 0700 - 97.9 F (36.6 C) Oral - - -  04/04/15 0419 113/66 mmHg - - 85 20 94 %  04/04/15 0417 - 98.2 F (36.8 C) Oral - - -  04/03/15 2351 114/66 mmHg 98.4 F (36.9 C) Oral 87 15 96 %  04/03/15 2033 - - - - - 100 %  04/03/15 1957 - 98.3 F (36.8 C) Oral - - -  04/03/15 1956 109/68 mmHg - - 89 17 96 %  04/03/15 1500 105/64 mmHg 98.3 F (36.8 C) Oral 82 19 97 %  04/03/15 1456 - 98.3 F (36.8 C) Oral - - -  04/03/15 1158 - - - 85 17 96 %  04/03/15 1101 104/69 mmHg 98.3 F (36.8 C) - 86 20 95 %  04/03/15 1100 - 98.3 F (36.8 C) Oral - - -   Current Weight  03/29/15 102 lb 1.2 oz (46.3 kg)     Intake/Output from previous day: 09/18 0701 - 09/19 0700 In: 680 [P.O.:660; I.V.:20] Out: 1480 [Urine:1350; Chest Tube:130]    PHYSICAL EXAM:  Heart: RRR Lungs: Clear Wound: Clean and dry Chest tube: Continuous 1/7 air leak, increases slightly with cough    Lab Results: CBC:No results for input(s): WBC, HGB, HCT, PLT in the last 72 hours. BMET: No results for input(s): NA, K, CL, CO2, GLUCOSE, BUN, CREATININE, CALCIUM in the last 72 hours.  PT/INR: No results for input(s): LABPROT, INR in the last 72 hours.  CXR: FINDINGS: 0553 hours. Two right-sided chest tubes remain in place. No evidence for residual right-sided pneumothorax. Elevation of the right fissure of is evident in this patient status post right upper lobectomy. Left lung remains clear. Cardiopericardial silhouette is at upper limits of normal for size. Telemetry leads overlie the chest.  IMPRESSION: Status post right upper  lobectomy with 2 right chest tubes in-situ. No evidence for residual pneumothorax at this time.   Assessment/Plan: S/P Procedure(s) (LRB): VIDEO BRONCHOSCOPY (N/A) VIDEO ASSISTED THORACOSCOPY (VATS)/LUNG RESECTION (Right) CXR stable, air leak persistent but slowly improving. Continue CT to suction for now.  Continue ambulation, pulm toilet.   LOS: 10 days    [name],[name] 04/04/2015  Lung staying up on 20 of suction, still with air leak Leave tubes for now Poss remove one tomorrow I have seen and examined Ned Card and agree with the above assessment  and plan.  Grace Isaac MD Beeper [phone] Office [phone] 04/04/2015 4:54 PM

## 2015-04-04 NOTE — Progress Notes (Signed)
Nutrition Follow-up  DOCUMENTATION CODES:   Not applicable  INTERVENTION:    Snacks TID between meals  Continue Glucerna Shake BID between meals  Try Boost Breeze daily  NUTRITION DIAGNOSIS:   Increased nutrient needs related to chronic illness as evidenced by estimated needs.  Ongoing   GOAL:   Patient will meet greater than or equal to 90% of their needs  Progressing   MONITOR:   PO intake, Supplement acceptance, Labs, Weight trends  ASSESSMENT:   54 yo female admitted with lung CA s/p chemotherapy. S/P VATS with right upper lobectomy and lymph node dissection on 9/9.  Patient reports that she is eating a little bit better than last week. Her appetite is still poor, but improving. Husband brought her a 6" steak and cheese sandwich from [place] and she ate 1/2 of it. She does not like the magic cups, "they are too thick." She doesn't really like the Glucerna supplements either, but drinks a little. She agreed to try [facility]. Also agreed to snacks TID between meals (cheese and crackers, vanilla pudding).  Diet Order:  Diet Heart Room service appropriate?: Yes; Fluid consistency:: Thin  Skin:  Reviewed, no issues  Last BM:  9/15  Height:   Ht Readings from Last 1 Encounters:  03/25/15 '5\' 2"'$  (1.575 m)    Weight:   Wt Readings from Last 1 Encounters:  03/29/15 102 lb 1.2 oz (46.3 kg)    Ideal Body Weight:  50 kg  BMI:  Body mass index is 18.66 kg/(m^2).  Estimated Nutritional Needs:   Kcal:  1550-1750  Protein:  75-85 gm  Fluid:  1.6-1.8 L  EDUCATION NEEDS:   Education needs addressed  Molli Barrows, [place], [place], [place] Pager [phone] After Hours Pager [phone]

## 2015-04-05 ENCOUNTER — Inpatient Hospital Stay (HOSPITAL_COMMUNITY): Payer: Medicaid Other

## 2015-04-05 NOTE — Progress Notes (Signed)
Posterior chest tube removed per Dr. Servando Snare order. Patient tolerated well. No signs of respiratory distress before, during, or after procedure. Patient was coached on using the incentive spirometer to help with lung expansion. Anterior chest tube remains connected to suction at 20 and was re-dressed.

## 2015-04-05 NOTE — Progress Notes (Addendum)
      [address]       [place] [address]             [phone]      11 Days Post-Op Procedure(s) (LRB): VIDEO BRONCHOSCOPY (N/A) VIDEO ASSISTED THORACOSCOPY (VATS)/LUNG RESECTION (Right)   Subjective:  Ms. Pe has no new complaints.  Her husband is again at bedside with multiple concerns and questions  Objective: Vital signs in last 24 hours: Temp:  [98 F (36.7 C)-98.5 F (36.9 C)] 98.5 F (36.9 C) (09/20 0738) Pulse Rate:  [74-96] 74 (09/20 0738) Cardiac Rhythm:  [-] Normal sinus rhythm (09/20 0738) Resp:  [12-20] 12 (09/20 0738) BP: (106-118)/(66-75) 106/75 mmHg (09/20 0738) SpO2:  [97 %-99 %] 98 % (09/20 0738)  Intake/Output from previous day: 09/19 0701 - 09/20 0700 In: 360 [P.O.:360] Out: 770 [Urine:650; Chest Tube:120] Intake/Output this shift: Total I/O In: -  Out: 300 [Urine:300]  General appearance: alert, cooperative and no distress Heart: regular rate and rhythm Lungs: clear to auscultation bilaterally Abdomen: soft, non-tender; bowel sounds normal; no masses,  no organomegaly Wound: clean and dry  Lab Results: No results for input(s): WBC, HGB, HCT, PLT in the last 72 hours. BMET: No results for input(s): NA, K, CL, CO2, GLUCOSE, BUN, CREATININE, CALCIUM in the last 72 hours.  PT/INR: No results for input(s): LABPROT, INR in the last 72 hours. ABG    Component Value Date/Time   PHART 7.390 03/26/2015 0415   HCO3 25.8* 03/26/2015 0415   TCO2 27.1 03/26/2015 0415   ACIDBASEDEF 0.2 03/23/2015 1353   O2SAT 99.1 03/26/2015 0415   CBG (last 3)  No results for input(s): GLUCAP in the last 72 hours.  Assessment/Plan: S/P Procedure(s) (LRB): VIDEO BRONCHOSCOPY (N/A) VIDEO ASSISTED THORACOSCOPY (VATS)/LUNG RESECTION (Right)  1. Chest tube- continued air leak, appears to be about 2-3+- CXR free from pneumothorax- leave chest tube on suction today 2. Dispo- patient stable, repeat CXR in AM, continue ambulation   LOS: 11  days    BARRETT, ERIN 04/05/2015  Dg Chest Port 1 View  04/05/2015   CLINICAL DATA:  Lung cancer  EXAM: PORTABLE CHEST - 1 VIEW  COMPARISON:  04/04/2015 earlier priors and chest CT 02/10/2015  FINDINGS: Normal heart size. Surgical suture in the right hilum, in this patient status post right upper lobectomy. Two right-sided chest tubes present with tips near the lung apex. Coronary artery stent. Emphysematous changes near the lung apices. Negative for airspace disease or pleural effusion.  No visible pneumothorax.  Stable appearance of the right lung apex.  No acute osseous abnormality. Visualized upper abdomen is unremarkable.  IMPRESSION: Stable exam.  Two right-sided chest tubes present.  No visible pneumothorax.  Postoperative changes of right upper lobectomy. Underlying emphysema.   Electronically Signed   By: Curlene Dolphin M.D.   On: 04/05/2015 08:22   No PTX on chest xray  20 cm suction, airl leak primarly from anteriro chest tube will d/c posterior chest tube and keep on suction for now  I have seen and examined Ned Card and agree with the above assessment  and plan.  Grace Isaac MD Beeper [phone] Office [phone] 04/05/2015 12:28 PM

## 2015-04-05 NOTE — Progress Notes (Signed)
UR COMPLETED  

## 2015-04-06 ENCOUNTER — Inpatient Hospital Stay (HOSPITAL_COMMUNITY): Payer: Medicaid Other

## 2015-04-06 MED ORDER — KETOROLAC TROMETHAMINE 15 MG/ML IJ SOLN
15.0000 mg | Freq: Four times a day (QID) | INTRAMUSCULAR | Status: AC | PRN
  Administered 2015-04-06 – 2015-04-08 (×2): 15 mg via INTRAVENOUS
  Filled 2015-04-06 (×2): qty 1

## 2015-04-06 MED ORDER — KETOROLAC TROMETHAMINE 15 MG/ML IJ SOLN: 15.0000 mg | Freq: Four times a day (QID) | INTRAMUSCULAR | Status: DC

## 2015-04-06 MED ORDER — KETOROLAC TROMETHAMINE 15 MG/ML IJ SOLN
15.0000 mg | Freq: Once | INTRAMUSCULAR | Status: AC
  Administered 2015-04-06: 15 mg via INTRAVENOUS
  Filled 2015-04-06: qty 1

## 2015-04-06 NOTE — Progress Notes (Addendum)
       [address]       [place] [address]             [phone]          12 Days Post-Op Procedure(s) (LRB): VIDEO BRONCHOSCOPY (N/A) VIDEO ASSISTED THORACOSCOPY (VATS)/LUNG RESECTION (Right)  Subjective: Feels well, no complaints.   Objective: Vital signs in last 24 hours: Patient Vitals for the past 24 hrs:  BP Temp Temp src Pulse Resp SpO2  04/06/15 0847 90/70 mmHg - - (!) 106 20 98 %  04/06/15 0840 - - - - - 99 %  04/06/15 0457 120/74 mmHg 98.4 F (36.9 C) Oral 83 11 99 %  04/05/15 2330 114/62 mmHg 98 F (36.7 C) Oral 81 (!) 22 98 %  04/05/15 2053 - - - - 20 100 %  04/05/15 2050 107/66 mmHg - - 93 16 99 %  04/05/15 1640 108/67 mmHg - - 79 20 100 %  04/05/15 1615 - - - - - 99 %  04/05/15 1231 - - - - - 99 %  04/05/15 1100 104/64 mmHg 98.1 F (36.7 C) Oral 79 16 97 %   Current Weight  03/29/15 102 lb 1.2 oz (46.3 kg)     Intake/Output from previous day: 09/20 0701 - 09/21 0700 In: 1920 [P.O.:1920] Out: 740 [Urine:700; Chest Tube:40]    PHYSICAL EXAM:  Heart: RRR Lungs: Clear Wound: Clean and dry Chest tube: Intermittent 1/7 air leak with cough    Lab Results: CBC:No results for input(s): WBC, HGB, HCT, PLT in the last 72 hours. BMET: No results for input(s): NA, K, CL, CO2, GLUCOSE, BUN, CREATININE, CALCIUM in the last 72 hours.  PT/INR: No results for input(s): LABPROT, INR in the last 72 hours.  CXR: FINDINGS: One right chest tube is noted. Cardiomediastinal silhouette is stable. There is small right apical pneumothorax. Left lung is clear. No segmental infiltrate or pulmonary edema.  IMPRESSION: Right chest tube in place. Tiny right apical pneumothorax. No pulmonary edema.   Assessment/Plan: S/P Procedure(s) (LRB): VIDEO BRONCHOSCOPY (N/A) VIDEO ASSISTED THORACOSCOPY (VATS)/LUNG RESECTION (Right) Air leak not noted at rest, intermittent 1/7 with cough. CXR stable.  Possibly decrease suction to -10 cm vs place CT  to water seal.   LOS: 12 days    [name],[name] 04/06/2015 Dg Chest Port 1 View  04/06/2015   CLINICAL DATA:  Follow-up right pneumothorax  EXAM: PORTABLE CHEST - 1 VIEW  COMPARISON:  04/05/2015  FINDINGS: One right chest tube is noted. Cardiomediastinal silhouette is stable. There is small right apical pneumothorax. Left lung is clear. No segmental infiltrate or pulmonary edema.  IMPRESSION: Right chest tube in place. Tiny right apical pneumothorax. No pulmonary edema.   Electronically Signed   By: Lahoma Crocker M.D.   On: 04/06/2015 08:32   Found chest tube not to suction and large air leak with cough Back to suction and air leak decreased to 1+ with cough Will leave on -20 suction now Confirm with nursing functioning of pleurovax and suction regulator I have seen and examined Ned Card and agree with the above assessment  and plan.  Grace Isaac MD Beeper [phone] Office [phone] 04/06/2015 1:35 PM

## 2015-04-06 NOTE — Progress Notes (Signed)
Pt. Tachypnic, hr 120's, c/o severe pain on Rt side of chest, absent lung sounds when auscultated. Applied 2l/min oxygen, ultram and xanax given, portable cxr ordered,Chest tube to 10cm suction, no kinks, air leak +3. notified md on call.

## 2015-04-07 ENCOUNTER — Inpatient Hospital Stay (HOSPITAL_COMMUNITY): Payer: Medicaid Other

## 2015-04-07 NOTE — Progress Notes (Signed)
Patient ID: Michelle King, female   DOB: 01-11-1961, 54 y.o.   MRN: [id number]      [facility]       ,[place] [address]             [phone]                 13 Days Post-Op Procedure(s) (LRB): VIDEO BRONCHOSCOPY (N/A) VIDEO ASSISTED THORACOSCOPY (VATS)/LUNG RESECTION (Right)  LOS: 13 days   Subjective: Stable today no new complaints  Objective: Vital signs in last 24 hours: Patient Vitals for the past 24 hrs:  BP Temp Temp src Pulse Resp SpO2  04/07/15 0849 - - - - - 100 %  04/07/15 0724 112/70 mmHg - - 83 (!) 25 98 %  04/07/15 0700 - 97.9 F (36.6 C) Oral - - -  04/07/15 0352 - 97.8 F (36.6 C) Oral - - -  04/07/15 0350 111/71 mmHg - - 73 12 100 %  04/06/15 2335 111/72 mmHg 97.9 F (36.6 C) Oral (!) 102 20 100 %  04/06/15 2015 - - - (!) 110 (!) 22 100 %  04/06/15 1949 121/75 mmHg 98.3 F (36.8 C) Oral (!) 105 18 97 %  04/06/15 1639 (!) 93/58 mmHg 98.4 F (36.9 C) Oral - 18 -  04/06/15 1538 - - - - - 100 %  04/06/15 1123 - - - - - 100 %  04/06/15 1039 - 98.4 F (36.9 C) Oral - - -    Filed Weights   03/26/15 0500 03/27/15 0600 03/29/15 0600  Weight: 105 lb 12.8 oz (47.991 kg) 104 lb 0.9 oz (47.2 kg) 102 lb 1.2 oz (46.3 kg)    Hemodynamic parameters for last 24 hours:    Intake/Output from previous day: 09/21 0701 - 09/22 0700 In: 960 [P.O.:960] Out: 100 [Chest Tube:100] Intake/Output this shift:    Scheduled Meds: . aspirin EC  81 mg Oral Daily  . bisacodyl  10 mg Oral Daily  . budesonide  0.5 mg Nebulization BID  . clopidogrel  75 mg Oral Daily  . enoxaparin (LOVENOX) injection  30 mg Subcutaneous Q24H  . feeding supplement  1 Container Oral Q24H  . feeding supplement (GLUCERNA SHAKE)  237 mL Oral BID BM  . Fluticasone Furoate-Vilanterol  1 puff Inhalation Daily  . folic acid  1 mg Oral Daily  . ipratropium  0.5 mg Nebulization QID  . senna-docusate  1 tablet Oral QHS   Continuous Infusions:  PRN Meds:.albuterol,  ALPRAZolam, diphenhydrAMINE, HYDROcodone-acetaminophen, ketorolac, potassium chloride, traMADol  General appearance: alert and cooperative Neurologic: intact Heart: regular rate and rhythm, S1, S2 normal, no murmur, click, rub or gallop Lungs: diminished breath sounds LUL and RUL Abdomen: soft, non-tender; bowel sounds normal; no masses,  no organomegaly Extremities: extremities normal, atraumatic, no cyanosis or edema and Homans sign is negative, no sign of DVT Air leak with cough   Lab Results: CBC:No results for input(s): WBC, HGB, HCT, PLT in the last 72 hours. BMET: No results for input(s): NA, K, CL, CO2, GLUCOSE, BUN, CREATININE, CALCIUM in the last 72 hours.  PT/INR: No results for input(s): LABPROT, INR in the last 72 hours.   Radiology Dg Chest Port 1 View  04/07/2015   CLINICAL DATA:  History of lung carcinoma, followup  EXAM: PORTABLE CHEST - 1 VIEW  COMPARISON:  Portable chest x-ray of 04/06/2015 and 04/05/2015  FINDINGS: There is little change in volume of the right apical pneumothorax with a right chest  tube remaining. The left lung is clear. Heart size is stable.  IMPRESSION: No significant change in volume of the right pneumothorax with a right chest tube remaining.   Electronically Signed   By: Ivar Drape M.D.   On: 04/07/2015 08:10   Dg Chest Port 1 View  04/06/2015   CLINICAL DATA:  Increasing right-sided chest pain. Pneumonia hemothorax. Right-sided chest tube.  EXAM: PORTABLE CHEST - 1 VIEW 8:35 p.m.  COMPARISON:  04/06/2015 at 6:44 a.m. and 04/05/2015  FINDINGS: Right apical pneumothorax has resolved. There is stool pleural line in the right hemithorax at approximately the level of the top of the aortic arch which may represent an anterior component to the pneumothorax. Chest tube is in good position. Recent right upper lobectomy. Improved aeration at the right lung base. Left lung is clear. Heart size and vascularity are normal.  IMPRESSION: Right apical pneumothorax  appears to have resolved. I think there is a small anterior pneumothorax as indicated by the pleural lying in the right upper lung zone.  Improved aeration at the right lung base.   Electronically Signed   By: Lorriane Shire M.D.   On: 04/06/2015 21:06   Dg Chest Port 1 View  04/06/2015   CLINICAL DATA:  Follow-up right pneumothorax  EXAM: PORTABLE CHEST - 1 VIEW  COMPARISON:  04/05/2015  FINDINGS: One right chest tube is noted. Cardiomediastinal silhouette is stable. There is small right apical pneumothorax. Left lung is clear. No segmental infiltrate or pulmonary edema.  IMPRESSION: Right chest tube in place. Tiny right apical pneumothorax. No pulmonary edema.   Electronically Signed   By: Lahoma Crocker M.D.   On: 04/06/2015 08:32     Assessment/Plan: S/P Procedure(s) (LRB): VIDEO BRONCHOSCOPY (N/A) VIDEO ASSISTED THORACOSCOPY (VATS)/LUNG RESECTION (Right) Chest tube to water seal, follow up chest xray   Grace Isaac MD 04/07/2015 9:21 AM

## 2015-04-07 NOTE — Progress Notes (Signed)
Nutrition Follow-up  INTERVENTION:    Continue snacks TID between meals to maximize oral intake.  NUTRITION DIAGNOSIS:   Increased nutrient needs related to chronic illness as evidenced by estimated needs.  Ongoing  GOAL:   Patient will meet greater than or equal to 90% of their needs  Progressing  MONITOR:   PO intake, Supplement acceptance, Labs, Weight trends  ASSESSMENT:   54 yo female admitted with lung CA s/p chemotherapy. S/P VATS with right upper lobectomy and lymph node dissection on 9/9.  Patient continues to receive a heart healthy diet with snacks TID between meals. She is eating a little better, enjoying her snacks. Consuming 50-75% of meals.  Diet Order:  Diet Heart Room service appropriate?: Yes; Fluid consistency:: Thin  Skin:  Reviewed, no issues  Last BM:  9/21  Height:   Ht Readings from Last 1 Encounters:  03/25/15 '5\' 2"'$  (1.575 m)    Weight:   Wt Readings from Last 1 Encounters:  03/29/15 102 lb 1.2 oz (46.3 kg)   03/27/15 104 lb 0.9 oz (47.2 kg)        Ideal Body Weight:  50 kg  BMI:  Body mass index is 18.66 kg/(m^2).  Estimated Nutritional Needs:   Kcal:  1550-1750  Protein:  75-85 gm  Fluid:  1.6-1.8 L  EDUCATION NEEDS:   Education needs addressed  Molli Barrows, [place], [place], [place] Pager [phone] After Hours Pager [phone]

## 2015-04-07 NOTE — Progress Notes (Signed)
Results of cxr given to md on call, new orders rec'd for toradol. Will continue to monitor.

## 2015-04-08 ENCOUNTER — Inpatient Hospital Stay (HOSPITAL_COMMUNITY): Payer: Medicaid Other

## 2015-04-08 NOTE — Progress Notes (Signed)
UR COMPLETED  

## 2015-04-08 NOTE — Progress Notes (Addendum)
       [address]       [place] [address]             [phone]          14 Days Post-Op Procedure(s) (LRB): VIDEO BRONCHOSCOPY (N/A) VIDEO ASSISTED THORACOSCOPY (VATS)/LUNG RESECTION (Right)  Subjective: Comfortable, no new complaints.    Objective: Vital signs in last 24 hours: Patient Vitals for the past 24 hrs:  BP Temp Temp src Pulse Resp SpO2  04/08/15 0747 (!) 82/65 mmHg - - 94 (!) 21 99 %  04/08/15 0745 (!) 86/63 mmHg - - 98 (!) 23 100 %  04/08/15 0346 (!) 103/59 mmHg 97.7 F (36.5 C) Oral 91 17 97 %  04/07/15 2302 - 97.8 F (36.6 C) Oral - - -  04/07/15 2300 102/64 mmHg - - 75 (!) 9 99 %  04/07/15 2259 102/64 mmHg - - 72 12 98 %  04/07/15 2010 (!) 88/63 mmHg 97.9 F (36.6 C) Oral 85 13 100 %  04/07/15 1933 - - - 85 18 97 %  04/07/15 1530 - 97.9 F (36.6 C) Oral - - -  04/07/15 1515 99/68 mmHg - - 96 (!) 29 98 %  04/07/15 1236 - 98.2 F (36.8 C) - - - -  04/07/15 0849 - - - - - 100 %   Current Weight  03/29/15 102 lb 1.2 oz (46.3 kg)     Intake/Output from previous day: 09/22 0701 - 09/23 0700 In: 600 [P.O.:600] Out: 0     PHYSICAL EXAM:  Heart: RRR Lungs: Diminished on R Wound: Clean and dry Chest tube: +large air leak    Lab Results: CBC:No results for input(s): WBC, HGB, HCT, PLT in the last 72 hours. BMET: No results for input(s): NA, K, CL, CO2, GLUCOSE, BUN, CREATININE, CALCIUM in the last 72 hours.  PT/INR: No results for input(s): LABPROT, INR in the last 72 hours.  CXR: increased R ptx   Assessment/Plan: S/P Procedure(s) (LRB): VIDEO BRONCHOSCOPY (N/A) VIDEO ASSISTED THORACOSCOPY (VATS)/LUNG RESECTION (Right) Air leak and ptx both increased this am.  Discussed with Dr. Servando Snare and will return CT to -30 cm suction.   LOS: 14 days    [name],[name] 04/08/2015  Increased ptx on the right with ct on water seal, placed back to suction at 30 cm Repeat film i am I have seen and examined Ned Card and agree with the above assessment  and plan.  Grace Isaac MD [facility] [phone] Office [phone] 04/08/2015 8:18 AM

## 2015-04-09 ENCOUNTER — Inpatient Hospital Stay (HOSPITAL_COMMUNITY): Payer: Medicaid Other

## 2015-04-09 NOTE — Progress Notes (Signed)
      [address]       [place] [address]             [phone]       15 Days Post-Op Procedure(s) (LRB): VIDEO BRONCHOSCOPY (N/A) VIDEO ASSISTED THORACOSCOPY (VATS)/LUNG RESECTION (Right)  Subjective: Patient emotional. She just wants "lung to be better so she can go home".  Objective: Vital signs in last 24 hours: Temp:  [97.9 F (36.6 C)-98.4 F (36.9 C)] 98.2 F (36.8 C) (09/24 0755) Pulse Rate:  [74-103] 103 (09/24 0755) Cardiac Rhythm:  [-] Normal sinus rhythm (09/24 0800) Resp:  [14-21] 16 (09/24 0755) BP: (93-108)/(61-66) 97/61 mmHg (09/24 0755) SpO2:  [95 %-100 %] 100 % (09/24 0827)   Intake/Output from previous day: 09/23 0701 - 09/24 0700 In: 1440 [P.O.:1440] Out: 0    Physical Exam:  Cardiovascular: Tachycardic Pulmonary: Clear to auscultation bilaterally; no rales, wheezes, or rhonchi. Abdomen: Soft, non tender, bowel sounds present. Wounds: Clean and dry.  No erythema or signs of infection. Chest Tube: to suction and there is an air leak  Lab Results: CBC:No results for input(s): WBC, HGB, HCT, PLT in the last 72 hours. BMET: No results for input(s): NA, K, CL, CO2, GLUCOSE, BUN, CREATININE, CALCIUM in the last 72 hours.  PT/INR: No results for input(s): LABPROT, INR in the last 72 hours. ABG:  INR: Will add last result for INR, ABG once components are confirmed Will add last 4 CBG results once components are confirmed  Assessment/Plan:  1. CV - ST in the 110's at this time. 2.  Pulmonary - On room air. Chest tube is to suction. There is still a consistent  air leak. CXR shows decreased right apical pneumothorax. Possibly decrease suction.Will check CXR in am again.  ZIMMERMAN,DONIELLE MPA-C 04/09/2015,11:38 AM

## 2015-04-10 ENCOUNTER — Inpatient Hospital Stay (HOSPITAL_COMMUNITY): Payer: Medicaid Other

## 2015-04-10 NOTE — Progress Notes (Addendum)
      [address]       [place] [address]             [phone]       16 Days Post-Op Procedure(s) (LRB): VIDEO BRONCHOSCOPY (N/A) VIDEO ASSISTED THORACOSCOPY (VATS)/LUNG RESECTION (Right)  Subjective: Patient receiving breathing treatment this am.  Objective: Vital signs in last 24 hours: Temp:  [98 F (36.7 C)-98.3 F (36.8 C)] 98.3 F (36.8 C) (09/25 0504) Pulse Rate:  [83-105] 105 (09/25 0800) Cardiac Rhythm:  [-] Normal sinus rhythm (09/25 0800) Resp:  [13-25] 23 (09/25 0800) BP: (90-117)/(60-83) 96/65 mmHg (09/25 0800) SpO2:  [97 %-100 %] 100 % (09/25 0800)   Intake/Output from previous day: 09/24 0701 - 09/25 0700 In: 240 [P.O.:240] Out: 575 [Urine:525; Chest Tube:50]   Physical Exam:  Cardiovascular: RRR Pulmonary: Clear to auscultation bilaterally; no rales, wheezes, or rhonchi. Abdomen: Soft, non tender, bowel sounds present. Wounds: Clean and dry.  No erythema or signs of infection. Chest Tube: to suction and there is an air leak that worsens with cough  Lab Results: CBC:No results for input(s): WBC, HGB, HCT, PLT in the last 72 hours. BMET: No results for input(s): NA, K, CL, CO2, GLUCOSE, BUN, CREATININE, CALCIUM in the last 72 hours.  PT/INR: No results for input(s): LABPROT, INR in the last 72 hours. ABG:  INR: Will add last result for INR, ABG once components are confirmed Will add last 4 CBG results once components are confirmed  Assessment/Plan:  1. CV - SR in the 90's at this time. 2.  Pulmonary - On room air. Chest tube is to 20 cm suction. There is still a consistent air leak. CXR shows small, stable right apical pneumothorax. Will decrease chest tube suction to 10 cm. Will check CXR in am. 3. Patient concerned on Plavix, ecasa, and Lovenox. Will stop Lovenox. SCDs of DVT prophylaxis.  ZIMMERMAN,DONIELLE MPA-C 04/10/2015,9:14 AM

## 2015-04-11 ENCOUNTER — Inpatient Hospital Stay (HOSPITAL_COMMUNITY): Payer: Medicaid Other

## 2015-04-11 LAB — CBC
HCT: 27.4 % — ABNORMAL LOW (ref 36.0–46.0)
Hemoglobin: 8.9 g/dL — ABNORMAL LOW (ref 12.0–15.0)
MCH: 29.4 pg (ref 26.0–34.0)
MCHC: 32.5 g/dL (ref 30.0–36.0)
MCV: 90.4 fL (ref 78.0–100.0)
Platelets: 481 10*3/uL — ABNORMAL HIGH (ref 150–400)
RBC: 3.03 MIL/uL — ABNORMAL LOW (ref 3.87–5.11)
RDW: 13.7 % (ref 11.5–15.5)
WBC: 6.8 10*3/uL (ref 4.0–10.5)

## 2015-04-11 LAB — BASIC METABOLIC PANEL
Anion gap: 10 (ref 5–15)
BUN: 15 mg/dL (ref 6–20)
CO2: 25 mmol/L (ref 22–32)
Calcium: 8.9 mg/dL (ref 8.9–10.3)
Chloride: 107 mmol/L (ref 101–111)
Creatinine, Ser: 0.48 mg/dL (ref 0.44–1.00)
GFR calc Af Amer: >60 mL/min (ref >60)
GFR calc non Af Amer: >60 mL/min (ref >60)
Glucose, Bld: 171 mg/dL — ABNORMAL HIGH (ref 65–99)
Potassium: 3.8 mmol/L (ref 3.5–5.1)
Sodium: 142 mmol/L (ref 135–145)

## 2015-04-11 NOTE — Progress Notes (Signed)
Patient ID: Michelle King, female   DOB: [birth year], 54 y.o.   MRN: [id number]      [facility]       [place] [address]             [phone]                 17 Days Post-Op Procedure(s) (LRB): VIDEO BRONCHOSCOPY (N/A) VIDEO ASSISTED THORACOSCOPY (VATS)/LUNG RESECTION (Right)  LOS: 17 days   Subjective: Stable, ambulating well  Objective: Vital signs in last 24 hours: Patient Vitals for the past 24 hrs:  BP Temp Temp src Pulse Resp SpO2  04/11/15 0846 - - - - - 100 %  04/11/15 0836 - - - - - 100 %  04/11/15 0738 95/68 mmHg 98.2 F (36.8 C) Oral 96 16 100 %  04/11/15 0447 103/67 mmHg 98.2 F (36.8 C) Oral 89 14 100 %  04/11/15 0251 - - - (!) 105 20 100 %  04/11/15 0008 (!) 109/56 mmHg 98.3 F (36.8 C) Oral 81 16 100 %  04/10/15 2141 - - - - - 98 %  04/10/15 1950 (!) 114/54 mmHg 98 F (36.7 C) Oral 95 (!) 24 98 %  04/10/15 1619 106/84 mmHg 98.1 F (36.7 C) Oral (!) 114 18 100 %  04/10/15 1340 - - - (!) 104 (!) 22 100 %  04/10/15 1149 (!) 91/59 mmHg 98 F (36.7 C) Oral (!) 101 (!) 23 100 %    Filed Weights   03/26/15 0500 03/27/15 0600 03/29/15 0600  Weight: 105 lb 12.8 oz (47.991 kg) 104 lb 0.9 oz (47.2 kg) 102 lb 1.2 oz (46.3 kg)    Hemodynamic parameters for last 24 hours:    Intake/Output from previous day:   Intake/Output this shift: Total I/O In: 240 [P.O.:240] Out: 0   Scheduled Meds: . aspirin EC  81 mg Oral Daily  . bisacodyl  10 mg Oral Daily  . budesonide  0.5 mg Nebulization BID  . clopidogrel  75 mg Oral Daily  . feeding supplement  1 Container Oral Q24H  . feeding supplement (GLUCERNA SHAKE)  237 mL Oral BID BM  . Fluticasone Furoate-Vilanterol  1 puff Inhalation Daily  . folic acid  1 mg Oral Daily  . ipratropium  0.5 mg Nebulization QID  . senna-docusate  1 tablet Oral QHS   Continuous Infusions:  PRN Meds:.albuterol, ALPRAZolam, diphenhydrAMINE, HYDROcodone-acetaminophen, potassium chloride, traMADol  General  appearance: alert and cooperative Neurologic: intact Heart: regular rate and rhythm, S1, S2 normal, no murmur, click, rub or gallop Lungs: clear to auscultation bilaterally Abdomen: soft, non-tender; bowel sounds normal; no masses,  no organomegaly Extremities: extremities normal, atraumatic, no cyanosis or edema and Homans sign is negative, no sign of DVT Wound: still with air leak  Lab Results: CBC:No results for input(s): WBC, HGB, HCT, PLT in the last 72 hours. BMET: No results for input(s): NA, K, CL, CO2, GLUCOSE, BUN, CREATININE, CALCIUM in the last 72 hours.  PT/INR: No results for input(s): LABPROT, INR in the last 72 hours.   Radiology Dg Chest Port 1 View  04/11/2015   CLINICAL DATA:  Pneumothorax follow-up.  EXAM: PORTABLE CHEST 1 VIEW  COMPARISON:  04/10/2015  FINDINGS: Right-sided chest tube unchanged and appears in adequate position with tip over the apex. Mild interval worsening of patient's small right pneumothorax postsurgical changes over the right hilar region. Left lung is clear. Cardiomediastinal silhouette and remainder of the exam is unchanged.  IMPRESSION: Mild  interval worsening of a small right pneumothorax. Right-sided chest tube unchanged in adequate position.   Electronically Signed   By: Marin Olp M.D.   On: 04/11/2015 08:05   Dg Chest Port 1 View  04/10/2015   CLINICAL DATA:  Pneumothorax, right chest tube.  EXAM: PORTABLE CHEST 1 VIEW  COMPARISON:  04/09/2015  FINDINGS: Right chest tube remains in place. Tiny right apical pneumothorax, is stable. No confluent airspace opacities. Heart is normal size. No effusions. No acute bony abnormality.  IMPRESSION: Stable tiny right apical pneumothorax with right chest tube in stable position.   Electronically Signed   By: Rolm Baptise M.D.   On: 04/10/2015 07:52     Assessment/Plan: S/P Procedure(s) (LRB): VIDEO BRONCHOSCOPY (N/A) VIDEO ASSISTED THORACOSCOPY (VATS)/LUNG RESECTION (Right) With persistent air leak I  have recommended to the patient and husband that we proceed with bronchoscopy and placement of intrabronchial valves. Risks and options have been discussed including the possibility will not decrease air leak and repeat surgical intervention may be needed. Plan for am   Grace Isaac MD 04/11/2015 10:46 AM

## 2015-04-12 ENCOUNTER — Encounter (HOSPITAL_COMMUNITY): Payer: Self-pay | Admitting: Certified Registered Nurse Anesthetist

## 2015-04-12 ENCOUNTER — Inpatient Hospital Stay (HOSPITAL_COMMUNITY): Payer: Medicaid Other | Admitting: Anesthesiology

## 2015-04-12 ENCOUNTER — Inpatient Hospital Stay (HOSPITAL_COMMUNITY): Payer: Medicaid Other

## 2015-04-12 ENCOUNTER — Encounter (HOSPITAL_COMMUNITY)
Admission: RE | Disposition: A | Discharge status: Home / Self Care | Payer: Self-pay | Source: Ambulatory Visit | Attending: Cardiothoracic Surgery

## 2015-04-12 DIAGNOSIS — J95812 Postprocedural air leak: Secondary | ICD-10-CM

## 2015-04-12 HISTORY — PX: VIDEO BRONCHOSCOPY WITH INSERTION OF INTERBRONCHIAL VALVE (IBV): SHX6178

## 2015-04-12 SURGERY — BRONCHOSCOPY, FLEXIBLE, WITH INTRABRONCHIAL VALVE INSERTION
Anesthesia: General

## 2015-04-12 MED ORDER — 0.9 % SODIUM CHLORIDE (POUR BTL) OPTIME
TOPICAL | Status: DC | PRN
  Administered 2015-04-12: 1000 mL

## 2015-04-12 MED ORDER — SUCCINYLCHOLINE CHLORIDE 20 MG/ML IJ SOLN
INTRAMUSCULAR | Status: AC
  Filled 2015-04-12: qty 1

## 2015-04-12 MED ORDER — PROPOFOL 10 MG/ML IV BOLUS
INTRAVENOUS | Status: AC
  Filled 2015-04-12: qty 20

## 2015-04-12 MED ORDER — NEOSTIGMINE METHYLSULFATE 10 MG/10ML IV SOLN
INTRAVENOUS | Status: DC | PRN
  Administered 2015-04-12: 3 mg via INTRAVENOUS

## 2015-04-12 MED ORDER — LIDOCAINE HCL (CARDIAC) 20 MG/ML IV SOLN
INTRAVENOUS | Status: AC
  Filled 2015-04-12: qty 10

## 2015-04-12 MED ORDER — PROMETHAZINE HCL 25 MG/ML IJ SOLN: 6.2500 mg | INTRAMUSCULAR | Status: DC | PRN

## 2015-04-12 MED ORDER — PHENYLEPHRINE HCL 10 MG/ML IJ SOLN
10.0000 mg | INTRAVENOUS | Status: DC | PRN
  Administered 2015-04-12: 20 ug/min via INTRAVENOUS

## 2015-04-12 MED ORDER — NEOSTIGMINE METHYLSULFATE 10 MG/10ML IV SOLN
INTRAVENOUS | Status: AC
  Filled 2015-04-12: qty 4

## 2015-04-12 MED ORDER — PROPOFOL 10 MG/ML IV BOLUS
INTRAVENOUS | Status: DC | PRN
  Administered 2015-04-12 (×2): 20 mg via INTRAVENOUS
  Administered 2015-04-12: 100 mg via INTRAVENOUS

## 2015-04-12 MED ORDER — GLYCOPYRROLATE 0.2 MG/ML IJ SOLN
INTRAMUSCULAR | Status: DC | PRN
  Administered 2015-04-12: 0.4 mg via INTRAVENOUS

## 2015-04-12 MED ORDER — DEXAMETHASONE SODIUM PHOSPHATE 4 MG/ML IJ SOLN
INTRAMUSCULAR | Status: AC
  Filled 2015-04-12: qty 2

## 2015-04-12 MED ORDER — DEXAMETHASONE SODIUM PHOSPHATE 4 MG/ML IJ SOLN
INTRAMUSCULAR | Status: DC | PRN
  Administered 2015-04-12: 8 mg via INTRAVENOUS

## 2015-04-12 MED ORDER — LIDOCAINE HCL 4 % MT SOLN
OROMUCOSAL | Status: DC | PRN
  Administered 2015-04-12: 4 mL via TOPICAL

## 2015-04-12 MED ORDER — ROCURONIUM BROMIDE 100 MG/10ML IV SOLN
INTRAVENOUS | Status: DC | PRN
  Administered 2015-04-12: 30 mg via INTRAVENOUS

## 2015-04-12 MED ORDER — PHENYLEPHRINE 40 MCG/ML (10ML) SYRINGE FOR IV PUSH (FOR BLOOD PRESSURE SUPPORT)
PREFILLED_SYRINGE | INTRAVENOUS | Status: AC
  Filled 2015-04-12: qty 10

## 2015-04-12 MED ORDER — LACTATED RINGERS IV SOLN
INTRAVENOUS | Status: DC | PRN
  Administered 2015-04-12 (×2): via INTRAVENOUS

## 2015-04-12 MED ORDER — PHENYLEPHRINE HCL 10 MG/ML IJ SOLN
INTRAMUSCULAR | Status: DC | PRN
  Administered 2015-04-12: 80 ug via INTRAVENOUS

## 2015-04-12 MED ORDER — EPHEDRINE SULFATE 50 MG/ML IJ SOLN
INTRAMUSCULAR | Status: AC
  Filled 2015-04-12: qty 1

## 2015-04-12 MED ORDER — GLYCOPYRROLATE 0.2 MG/ML IJ SOLN
INTRAMUSCULAR | Status: AC
  Filled 2015-04-12: qty 2

## 2015-04-12 MED ORDER — ONDANSETRON HCL 4 MG/2ML IJ SOLN
INTRAMUSCULAR | Status: AC
  Filled 2015-04-12: qty 2

## 2015-04-12 MED ORDER — MIDAZOLAM HCL 5 MG/5ML IJ SOLN
INTRAMUSCULAR | Status: DC | PRN
  Administered 2015-04-12: 2 mg via INTRAVENOUS

## 2015-04-12 MED ORDER — MIDAZOLAM HCL 2 MG/2ML IJ SOLN
INTRAMUSCULAR | Status: AC
Start: 2015-04-12 — End: 2015-04-12
  Filled 2015-04-12: qty 4

## 2015-04-12 MED ORDER — FENTANYL CITRATE (PF) 250 MCG/5ML IJ SOLN
INTRAMUSCULAR | Status: AC
  Filled 2015-04-12: qty 5

## 2015-04-12 MED ORDER — ROCURONIUM BROMIDE 50 MG/5ML IV SOLN
INTRAVENOUS | Status: AC
  Filled 2015-04-12: qty 1

## 2015-04-12 MED ORDER — FENTANYL CITRATE (PF) 100 MCG/2ML IJ SOLN: 25.0000 ug | INTRAMUSCULAR | Status: DC | PRN

## 2015-04-12 MED ORDER — LIDOCAINE HCL (CARDIAC) 20 MG/ML IV SOLN
INTRAVENOUS | Status: DC | PRN
  Administered 2015-04-12: 60 mg via INTRAVENOUS

## 2015-04-12 MED ORDER — FENTANYL CITRATE (PF) 100 MCG/2ML IJ SOLN
INTRAMUSCULAR | Status: DC | PRN
  Administered 2015-04-12: 100 ug via INTRAVENOUS
  Administered 2015-04-12: 50 ug via INTRAVENOUS

## 2015-04-12 MED ORDER — ONDANSETRON HCL 4 MG/2ML IJ SOLN
INTRAMUSCULAR | Status: DC | PRN
  Administered 2015-04-12: 4 mg via INTRAVENOUS

## 2015-04-12 MED ORDER — STERILE WATER FOR INJECTION IJ SOLN
INTRAMUSCULAR | Status: AC
  Filled 2015-04-12: qty 10

## 2015-04-12 SURGICAL SUPPLY — 33 items
CANISTER SUCTION 2500CC (MISCELLANEOUS) ×2 IMPLANT
CATH BALLN 4FR (CATHETERS) ×2 IMPLANT
CATH EMB 4FR 80CM (CATHETERS) IMPLANT
CATH EMB 5FR 80CM (CATHETERS) IMPLANT
CATH LOADER DEPLOYMENT HUD (CATHETERS) ×2 IMPLANT
CONT SPEC 4OZ CLIKSEAL STRL BL (MISCELLANEOUS) ×2 IMPLANT
COVER TABLE BACK 60X90 (DRAPES) ×2 IMPLANT
DRAPE PROXIMA HALF (DRAPES) ×2 IMPLANT
DRSG AQUACEL AG ADV 3.5X14 (GAUZE/BANDAGES/DRESSINGS) IMPLANT
FILTER STRAW FLUID ASPIR (MISCELLANEOUS) IMPLANT
FORCEPS BIOP RJ4 1.8 (CUTTING FORCEPS) IMPLANT
GAUZE SPONGE 4X4 12PLY STRL (GAUZE/BANDAGES/DRESSINGS) ×2 IMPLANT
GLOVE BIO SURGEON STRL SZ 6.5 (GLOVE) ×2 IMPLANT
GLOVE BIOGEL PI IND STRL 6 (GLOVE) ×1 IMPLANT
GLOVE BIOGEL PI INDICATOR 6 (GLOVE) ×1
GOWN STRL REUS W/ TWL LRG LVL3 (GOWN DISPOSABLE) ×2 IMPLANT
GOWN STRL REUS W/TWL LRG LVL3 (GOWN DISPOSABLE) ×2
KIT AIRWAY SIZING HUD (KITS) ×2 IMPLANT
KIT CLEAN ENDO COMPLIANCE (KITS) ×2 IMPLANT
KIT ROOM TURNOVER OR (KITS) ×2 IMPLANT
MARKER SKIN DUAL TIP RULER LAB (MISCELLANEOUS) IMPLANT
NS IRRIG 1000ML POUR BTL (IV SOLUTION) ×2 IMPLANT
OIL SILICONE PENTAX (PARTS (SERVICE/REPAIRS)) ×2 IMPLANT
PAD ARMBOARD 7.5X6 YLW CONV (MISCELLANEOUS) ×4 IMPLANT
STOPCOCK 4 WAY LG BORE MALE ST (IV SETS) ×2 IMPLANT
STOPCOCK MORSE 400PSI 3WAY (MISCELLANEOUS) ×2 IMPLANT
SYR 20ML ECCENTRIC (SYRINGE) ×2 IMPLANT
SYR 5ML LL (SYRINGE) ×2 IMPLANT
SYRINGE 10CC LL (SYRINGE) ×2 IMPLANT
TOWEL NATURAL 4PK STERILE (DISPOSABLE) ×2 IMPLANT
TRAP SPECIMEN MUCOUS 40CC (MISCELLANEOUS) ×2 IMPLANT
TUBE CONNECTING 20X1/4 (TUBING) ×4 IMPLANT
VALVE IN CARTRIDGE 7MM HUD (Valve) ×6 IMPLANT

## 2015-04-12 NOTE — Anesthesia Procedure Notes (Signed)
Procedure Name: Intubation Date/Time: 04/12/2015 7:36 AM Performed by: Garrison Columbus T Pre-anesthesia Checklist: Patient identified, Emergency Drugs available, Suction available and Patient being monitored Patient Re-evaluated:Patient Re-evaluated prior to inductionOxygen Delivery Method: Circle system utilized Preoxygenation: Pre-oxygenation with 100% oxygen Intubation Type: IV induction Ventilation: Mask ventilation without difficulty Laryngoscope Size: Miller and 2 Grade View: Grade I Tube type: Oral Tube size: 8.5 mm Number of attempts: 1 Airway Equipment and Method: Stylet and LTA kit utilized Placement Confirmation: ETT inserted through vocal cords under direct vision,  positive ETCO2 and breath sounds checked- equal and bilateral Secured at: 21 cm Tube secured with: Tape Dental Injury: Teeth and Oropharynx as per pre-operative assessment

## 2015-04-12 NOTE — OR Nursing (Signed)
70m Spiration Valves implanted into Superior Segment, Medial and Posterior Segment, and Lateral Segment of Right Lower Lobe

## 2015-04-12 NOTE — Anesthesia Postprocedure Evaluation (Signed)
  Anesthesia Post-op Note  Patient: Michelle King  Procedure(s) Performed: Procedure(s) with comments: VIDEO BRONCHOSCOPY WITH INSERTION OF 7MM INTERBRONCHIAL VALVES (IBV) IN SUPERIOR SEGMENT, MEDIAL AND POSTERIOR SEGMENT, AND LATERAL SEGMENT OF RIGHT LOWER LOBE (N/A) - 31m Spiration Valve placed in Superior Segment, Medial and Posterior Segment, and Lateral Segment of Right Lower Lobe  Patient Location: PACU  Anesthesia Type:General  Level of Consciousness: awake and alert   Airway and Oxygen Therapy: Patient Spontanous Breathing  Post-op Pain: none  Post-op Assessment: Post-op Vital signs reviewed              Post-op Vital Signs: Reviewed  Last Vitals:  Filed Vitals:   04/12/15 1200  BP: 90/59  Pulse: 104  Temp: 37.2 C  Resp: 32    Complications: No apparent anesthesia complications

## 2015-04-12 NOTE — Anesthesia Preprocedure Evaluation (Addendum)
Anesthesia Evaluation  Patient identified by MRN, date of birth, ID band Patient awake    Reviewed: Allergy & Precautions, NPO status , Patient's Chart, lab work & pertinent test results  Airway Mallampati: II  TM Distance: >3 FB Neck ROM: Full    Dental  (+) Edentulous Upper, Partial Lower, Dental Advisory Given   Pulmonary COPD, Current Smoker,  S/p VATS lung resection with persistent air leak   breath sounds clear to auscultation       Cardiovascular + angina + CAD, + Past MI and + Cardiac Stents   Rhythm:Regular Rate:Normal     Neuro/Psych negative neurological ROS     GI/Hepatic negative GI ROS, Neg liver ROS,   Endo/Other  negative endocrine ROS  Renal/GU negative Renal ROS     Musculoskeletal negative musculoskeletal ROS (+)   Abdominal   Peds  Hematology  (+) anemia ,   Anesthesia Other Findings   Reproductive/Obstetrics                           Anesthesia Physical Anesthesia Plan  ASA: III  Anesthesia Plan: General   Post-op Pain Management:    Induction: Intravenous  Airway Management Planned: Oral ETT  Additional Equipment:   Intra-op Plan:   Post-operative Plan: Extubation in OR  Informed Consent: I have reviewed the patients History and Physical, chart, labs and discussed the procedure including the risks, benefits and alternatives for the proposed anesthesia with the patient or authorized representative who has indicated his/her understanding and acceptance.   Dental advisory given  Plan Discussed with: CRNA  Anesthesia Plan Comments:         Anesthesia Quick Evaluation

## 2015-04-12 NOTE — Transfer of Care (Signed)
Immediate Anesthesia Transfer of Care Note  Patient: Michelle King  Procedure(s) Performed: Procedure(s) with comments: VIDEO BRONCHOSCOPY WITH INSERTION OF 7MM INTERBRONCHIAL VALVES (IBV) IN SUPERIOR SEGMENT, MEDIAL AND POSTERIOR SEGMENT, AND LATERAL SEGMENT OF RIGHT LOWER LOBE (N/A) - 108m Spiration Valve placed in Superior Segment, Medial and Posterior Segment, and Lateral Segment of Right Lower Lobe  Patient Location: PACU  Anesthesia Type:General  Level of Consciousness: awake, alert  and oriented  Airway & Oxygen Therapy: Patient Spontanous Breathing and Patient connected to nasal cannula oxygen  Post-op Assessment: Report given to RN, Post -op Vital signs reviewed and stable and Patient moving all extremities X 4  Post vital signs: Reviewed and stable  Last Vitals:  Filed Vitals:   04/12/15 0333  BP: 92/57  Pulse: 88  Temp: 36.6 C  Resp: 12    Complications: No apparent anesthesia complications

## 2015-04-12 NOTE — Progress Notes (Signed)
UR COMPLETED  

## 2015-04-12 NOTE — Brief Op Note (Signed)
      [address]       [place] [address]             [phone]      04/12/2015  9:40 AM  PATIENT:  Michelle King  54 y.o. female  PRE-OPERATIVE DIAGNOSIS:  AIR LEAK post lobectomy  POST-OPERATIVE DIAGNOSIS:  same  PROCEDURE:  Procedure(s) with comments: VIDEO BRONCHOSCOPY WITH INSERTION OF 7MM INTERBRONCHIAL VALVES (IBV) IN SUPERIOR SEGMENT, MEDIAL AND POSTERIOR SEGMENT, AND LATERAL SEGMENT OF RIGHT LOWER LOBE (N/A) - 40m Spiration Valve placed in Superior Segment, Medial and Posterior Segment, and Lateral Segment of Right Lower Lobe  SURGEON:  Surgeon(s) and Role:    * EGrace Isaac MD - Primary  ANESTHESIA:   general  EBL:  Total I/O In: 1100 [I.V.:1100] Out: 20 [Blood:20]  BLOOD ADMINISTERED:none  DRAINS: none   LOCAL MEDICATIONS USED:  NONE  SPECIMEN:  No Specimen  DISPOSITION OF SPECIMEN:  N/A  COUNTS:  YES  DICTATION: .Dragon Dictation  PLAN OF CARE: patient is in patient  PATIENT DISPOSITION:  PACU - hemodynamically stable.   Delay start of Pharmacological VTE agent (>24hrs) due to surgical blood loss or risk of bleeding: yes

## 2015-04-13 ENCOUNTER — Inpatient Hospital Stay (HOSPITAL_COMMUNITY): Payer: Medicaid Other

## 2015-04-13 ENCOUNTER — Encounter (HOSPITAL_COMMUNITY): Payer: Self-pay | Admitting: Cardiothoracic Surgery

## 2015-04-13 LAB — CBC WITH DIFFERENTIAL/PLATELET
BASOS ABS: 0 10*3/uL (ref 0.0–0.1)
BASOS PCT: 0 %
EOS PCT: 8 %
Eosinophils Absolute: 0.5 10*3/uL (ref 0.0–0.7)
HCT: 24.1 % — ABNORMAL LOW (ref 36.0–46.0)
Hemoglobin: 7.8 g/dL — ABNORMAL LOW (ref 12.0–15.0)
LYMPHS PCT: 38 %
Lymphs Abs: 2.6 10*3/uL (ref 0.7–4.0)
MCH: 29 pg (ref 26.0–34.0)
MCHC: 32.4 g/dL (ref 30.0–36.0)
MCV: 89.6 fL (ref 78.0–100.0)
MONO ABS: 0.6 10*3/uL (ref 0.1–1.0)
Monocytes Relative: 9 %
Neutro Abs: 3.1 10*3/uL (ref 1.7–7.7)
Neutrophils Relative %: 45 %
PLATELETS: 436 10*3/uL — AB (ref 150–400)
RBC: 2.69 MIL/uL — AB (ref 3.87–5.11)
RDW: 13.9 % (ref 11.5–15.5)
WBC: 6.8 10*3/uL (ref 4.0–10.5)

## 2015-04-13 NOTE — Progress Notes (Addendum)
      [address]       [place] [address]             [phone]      1 Day Post-Op Procedure(s) (LRB): VIDEO BRONCHOSCOPY WITH INSERTION OF 7MM INTERBRONCHIAL VALVES (IBV) IN SUPERIOR SEGMENT, MEDIAL AND POSTERIOR SEGMENT, AND LATERAL SEGMENT OF RIGHT LOWER LOBE (N/A)   Subjective:  Ms. Illescas has no new complaints.  Wants to go home.  Ambulating independently.  Objective: Vital signs in last 24 hours: Temp:  [97 F (36.1 C)-99 F (37.2 C)] 98.2 F (36.8 C) (09/28 0700) Pulse Rate:  [81-116] 116 (09/28 0425) Cardiac Rhythm:  [-] Normal sinus rhythm;Sinus tachycardia (09/28 0425) Resp:  [16-32] 20 (09/28 0425) BP: (88-117)/(53-77) 111/68 mmHg (09/28 0425) SpO2:  [97 %-100 %] 99 % (09/28 0425)  Intake/Output from previous day: 09/27 0701 - 09/28 0700 In: 1340 [P.O.:240; I.V.:1100] Out: 470 [Urine:450; Blood:20]  General appearance: alert, cooperative and no distress Heart: regular rate and rhythm Lungs: clear to auscultation bilaterally Abdomen: soft, non-tender; bowel sounds normal; no masses,  no organomegaly Wound: clean and dry  Lab Results:  Recent Labs  04/11/15 1435  WBC 6.8  HGB 8.9*  HCT 27.4*  PLT 481*   BMET:  Recent Labs  04/11/15 1435  NA 142  K 3.8  CL 107  CO2 25  GLUCOSE 171*  BUN 15  CREATININE 0.48  CALCIUM 8.9    PT/INR: No results for input(s): LABPROT, INR in the last 72 hours. ABG    Component Value Date/Time   PHART 7.390 03/26/2015 0415   HCO3 25.8* 03/26/2015 0415   TCO2 27.1 03/26/2015 0415   ACIDBASEDEF 0.2 03/23/2015 1353   O2SAT 99.1 03/26/2015 0415   CBG (last 3)  No results for input(s): GLUCAP in the last 72 hours.  Dg Chest Port 1 View  04/13/2015   CLINICAL DATA:  Chest tube  EXAM: PORTABLE CHEST 1 VIEW  COMPARISON:  04/12/2015  FINDINGS: Right chest tube remains in place with stable small right apical pneumothorax. Postoperative changes on the right. Heart is normal size. No confluent  opacities. No visible effusions or acute bony abnormality.  IMPRESSION: Stable small right apical pneumothorax. Stable position of the right chest tube.   Electronically Signed   By: Rolm Baptise M.D.   On: 04/13/2015 07:42    Assessment/Plan: S/P Procedure(s) (LRB): VIDEO BRONCHOSCOPY WITH INSERTION OF 7MM INTERBRONCHIAL VALVES (IBV) IN SUPERIOR SEGMENT, MEDIAL AND POSTERIOR SEGMENT, AND LATERAL SEGMENT OF RIGHT LOWER LOBE (N/A)  1. CV- Sinus Tach 2. Pulm- continued air leak with cough, CXR remains stable with apical pneumothorax- leave chest tube on suction for now 3. Dispo- patient stable, continue current care, repeat CXR in AM   LOS: 19 days    BARRETT, ERIN 04/13/2015  Air leak  with coughing, will try off suction now and see if lung will stay up, then consider home with mini express I have seen and examined Ned Card and agree with the above assessment  and plan.  Grace Isaac MD Beeper [phone] Office [phone] 04/13/2015 2:25 PM

## 2015-04-13 NOTE — Progress Notes (Signed)
Nutrition Follow-up  DOCUMENTATION CODES:   Not applicable  INTERVENTION:    Continue snacks TID between meals to maximize oral intake.  NUTRITION DIAGNOSIS:   Increased nutrient needs related to chronic illness as evidenced by estimated needs.  Ongoing  GOAL:   Patient will meet greater than or equal to 90% of their needs  Met  MONITOR:   PO intake, Supplement acceptance, Labs, Weight trends  REASON FOR ASSESSMENT:   Malnutrition Screening Tool    ASSESSMENT:   54 yo female admitted with lung CA s/p chemotherapy. S/P VATS with right upper lobectomy and lymph node dissection on 9/9.  S/P video bronchoscopy on 9/27. Continues to receive a heart healthy diet with snacks TID between meals. She is consuming 75-100% of meals. Suspect intake is meeting nutrition needs.  Diet Order:  Diet regular Room service appropriate?: Yes; Fluid consistency:: Thin  Skin:  Reviewed, no issues  Last BM:  9/26  Height:   Ht Readings from Last 1 Encounters:  03/25/15 5' 2"  (1.575 m)    Weight:   Wt Readings from Last 1 Encounters:  03/29/15 102 lb 1.2 oz (46.3 kg)    Ideal Body Weight:  50 kg  BMI:  Body mass index is 18.66 kg/(m^2).  Estimated Nutritional Needs:   Kcal:  1550-1750  Protein:  75-85 gm  Fluid:  1.6-1.8 L  EDUCATION NEEDS:   Education needs addressed  Molli Barrows, [place], [place], [place] Pager [phone] After Hours Pager [phone]

## 2015-04-13 NOTE — Progress Notes (Signed)
Paged Ellwood Handler PA regarding blood clots in stool. New orders received. Will continue to monitor pt.

## 2015-04-14 ENCOUNTER — Inpatient Hospital Stay (HOSPITAL_COMMUNITY): Payer: Medicaid Other

## 2015-04-14 DIAGNOSIS — K921 Melena: Secondary | ICD-10-CM

## 2015-04-14 HISTORY — DX: Melena: K92.1

## 2015-04-14 LAB — CBC
HCT: 25.4 % — ABNORMAL LOW (ref 36.0–46.0)
Hemoglobin: 8.2 g/dL — ABNORMAL LOW (ref 12.0–15.0)
MCH: 29 pg (ref 26.0–34.0)
MCHC: 32.3 g/dL (ref 30.0–36.0)
MCV: 89.8 fL (ref 78.0–100.0)
Platelets: 448 10*3/uL — ABNORMAL HIGH (ref 150–400)
RBC: 2.83 MIL/uL — ABNORMAL LOW (ref 3.87–5.11)
RDW: 13.7 % (ref 11.5–15.5)
WBC: 7.1 10*3/uL (ref 4.0–10.5)

## 2015-04-14 LAB — BASIC METABOLIC PANEL
Anion gap: 8 (ref 5–15)
BUN: 11 mg/dL (ref 6–20)
CO2: 30 mmol/L (ref 22–32)
Calcium: 9.2 mg/dL (ref 8.9–10.3)
Chloride: 104 mmol/L (ref 101–111)
Creatinine, Ser: 0.55 mg/dL (ref 0.44–1.00)
GFR calc Af Amer: >60 mL/min (ref >60)
GFR calc non Af Amer: >60 mL/min (ref >60)
Glucose, Bld: 103 mg/dL — ABNORMAL HIGH (ref 65–99)
Potassium: 4.1 mmol/L (ref 3.5–5.1)
Sodium: 142 mmol/L (ref 135–145)

## 2015-04-14 LAB — TYPE AND SCREEN
ABO/RH(D): O NEG
Antibody Screen: NEGATIVE

## 2015-04-14 MED ORDER — PANTOPRAZOLE SODIUM 40 MG PO TBEC
40.0000 mg | DELAYED_RELEASE_TABLET | Freq: Every day | ORAL | Status: DC
  Administered 2015-04-14 – 2015-05-05 (×21): 40 mg via ORAL
  Filled 2015-04-14 (×19): qty 1

## 2015-04-14 NOTE — Progress Notes (Addendum)
[address]       [place] [address]             [phone]          2 Days Post-Op Procedure(s) (LRB): VIDEO BRONCHOSCOPY WITH INSERTION OF 7MM INTERBRONCHIAL VALVES (IBV) IN SUPERIOR SEGMENT, MEDIAL AND POSTERIOR SEGMENT, AND LATERAL SEGMENT OF RIGHT LOWER LOBE (N/A)  Subjective: Understandably frustrated about progress.  Breathing stable, some chest discomfort, but better after returning to suction. C/o bloody stools.   Objective: Vital signs in last 24 hours: Patient Vitals for the past 24 hrs:  BP Temp Temp src Pulse Resp SpO2  04/14/15 1235 93/62 mmHg 98.3 F (36.8 C) Oral (!) 102 15 97 %  04/14/15 1234 - - - (!) 111 (!) 30 98 %  04/14/15 0853 - - - (!) 103 18 97 %  04/14/15 0743 92/67 mmHg - - 90 16 98 %  04/14/15 0700 - 98.2 F (36.8 C) Oral - - -  04/14/15 0311 97/61 mmHg 97.9 F (36.6 C) Oral 77 13 96 %  04/14/15 0023 96/68 mmHg 98.2 F (36.8 C) Oral 95 18 98 %  04/13/15 2100 - - - - - 97 %  04/13/15 1943 118/72 mmHg 98.4 F (36.9 C) Oral 98 (!) 28 97 %  04/13/15 1618 95/66 mmHg - - 97 17 98 %  04/13/15 1530 95/66 mmHg - - (!) 104 18 95 %  04/13/15 1500 - 98.9 F (37.2 C) Oral - - -  04/13/15 1311 - - - 88 20 99 %   Current Weight  03/29/15 102 lb 1.2 oz (46.3 kg)     Intake/Output from previous day: 09/28 0701 - 09/29 0700 In: 120 [P.O.:120] Out: 40 [Chest Tube:40]    PHYSICAL EXAM:  Heart: RRR Lungs: Clear Wound: Clean and dry Chest tube: Continuous 1/7 air leak, increased to 3-4/7 with cough    Lab Results: CBC: Recent Labs  04/13/15 1710 04/14/15 1033  WBC 6.8 7.1  HGB 7.8* 8.2*  HCT 24.1* 25.4*  PLT 436* 448*   BMET:  Recent Labs  04/11/15 1435 04/14/15 1033  NA 142 142  K 3.8 4.1  CL 107 104  CO2 25 30  GLUCOSE 171* 103*  BUN 15 11  CREATININE 0.48 0.55  CALCIUM 8.9 9.2    PT/INR: No results for input(s): LABPROT, INR in the last 72 hours.   CXR: FINDINGS: Interval increase in right  pneumothorax which is now approximately 50%. Right chest tube remains in place. Intra bronchial valves are noted on the right and unchanged  Left lung is clear. No heart failure or effusion.  IMPRESSION: 50% right pneumothorax has increased considerably since yesterday. This is consistent with air leak.   Assessment/Plan: S/P Procedure(s) (LRB): VIDEO BRONCHOSCOPY WITH INSERTION OF 7MM INTERBRONCHIAL VALVES (IBV) IN SUPERIOR SEGMENT, MEDIAL AND POSTERIOR SEGMENT, AND LATERAL SEGMENT OF RIGHT LOWER LOBE (N/A) Increased ptx while on water seal, now back on -30 cm suction. Continue suction for now. She may ultimately need to return to OR at some point for re-exploration. GI- bloody stools, on Plavix for stent. Appreciate GI's input. Plavix held for possible colonoscopy/EGD next week. Postoperative blood loss anemia- H/H generally stable. Follow.   LOS: 20 days    [name],[name] 04/14/2015  Lung down today off suction, returned to suction Two episodes of blood in stool, hct stable, GI consulted and plavix and lovenox held Still with 1+ air leak I have  seen and examined Ned Card and agree with the above assessment  and plan.  Grace Isaac MD Beeper [phone] Office [phone] 04/14/2015 5:33 PM

## 2015-04-14 NOTE — Progress Notes (Signed)
Patient reported to me that she had a bloody bowel movement. Notified MD. Orders received. Will continue to monitor.

## 2015-04-14 NOTE — Progress Notes (Signed)
Radiologist notified me of chest x ray results. Called [place] PA and received order to place chest tube to 30cm of suction. Chest tube is on 30 cm of suction and patient is tolerating fine. VSS. Will continue to monitor.

## 2015-04-14 NOTE — Consult Note (Signed)
[facility] Consult: 10:35 AM 04/14/2015  LOS: 20 days    Referring Provider: Dr Jobie Quaker.  Primary Care Physician:  Charlotte Sanes, MD Primary Gastroenterologist:  Althia Forts.  No GI Md ever    Reason for Consultation:  Blood in stool   HPI: Michelle King is a 54 y.o. female.  Hx MI 2010 with coronary stent placement, on Plavix since.  COPD. Currently in midst of lengthy admission following 03/25/15 VATS?RUL lobectomy for [place] lung cancer.  Preop treatment with chemo for stage 3A vs stage 2 disease. Still smoking up to time of this admission.    Post op course prolonged by persistent air leak.  Bronchoscopy on 9/27. She has been eating well without any GI distress.  Daily BMs Lovenox DVT prophylaxis stopped over last weekend due to some small amount of blood streaking in her nasal discharge but no true epistaxis. .   Last night past clots of blood.  She has not been constipated. There was no abdominal pain, no nausea, no GI upset. This morning's bowel movement was soft, formed and black in color. It does not smell melenic.  There is been no dizziness, weakness. Her breathing is not acutely worse.  Plavix stopped, last dose 9/28. Her hemoglobin on 03/23/15 was 13.3. The day of surgery it was 9.6. Since then it is ranged between 8 and mid nines. Yesterday it dropped to 7.8. It is 8.2 today. MCV is normal. She has not received any blood transfusions now or in past. Platelets are adequate.  Coags were obtained 2 days prior to surgery and were normal.  Renal function is normal. Patient denies change in bowel habits her appetite has been excellent since finishing up chemotherapy a few months ago. She never sees blood in her stools. She doesn't have reflux disease, no dysphagia. Rarely used ibuprofen or Aleve prior to  hospitalization. Review of the CT of the chest performed at [facility] on [facility]10[facility]15. This showed interval decrease in the size of the right lung mass, emphysema, and aortic atherosclerosis sclerosis.  There is no abdominal imaging that I can find within Epic.  Patient has strong family history of colon cancer. Her father developed the disease in his mid 39s and it killed him. Her brother also developed lung cancer in his mid 76s and it is metastatic and sounds like he's dying slowly with colon cancer. Her twin sister had lung cancer, she was also a smoker. She also has a sister with a history of breast cancer.     Past Medical History  Diagnosis Date  . COPD (chronic obstructive pulmonary disease)   . Hyperlipidemia   . S/P hysterectomy   . Complication of anesthesia   . PONV (postoperative nausea and vomiting)   . CAD (coronary artery disease)   . Myocardial infarction 2010  . Anginal pain   . Shortness of breath dyspnea   . Pneumonia     hx  . Cancer     lung    Past Surgical History  Procedure Laterality Date  . Appendectomy    .  Tubal ligation    . Mouth surgery      teeth  . Cardiac catheterization      stents  . Video bronchoscopy N/A 03/25/2015    Procedure: VIDEO BRONCHOSCOPY;  Surgeon: Grace Isaac, MD;  Location: [facility];  Service: Thoracic;  Laterality: N/A;  . Video assisted thoracoscopy (vats)/wedge resection Right 03/25/2015    Procedure: VIDEO ASSISTED THORACOSCOPY (VATS)/LUNG RESECTION;  Surgeon: Grace Isaac, MD;  Location: [facility];  Service: Thoracic;  Laterality: Right;  . Video bronchoscopy with insertion of interbronchial valve (ibv) N/A 04/12/2015    Procedure: VIDEO BRONCHOSCOPY WITH INSERTION OF 7MM INTERBRONCHIAL VALVES (IBV) IN SUPERIOR SEGMENT, MEDIAL AND POSTERIOR SEGMENT, AND LATERAL SEGMENT OF RIGHT LOWER LOBE;  Surgeon: Grace Isaac, MD;  Location: [facility];  Service: Thoracic;  Laterality: N/A;  86m Spiration Valve placed in Superior  Segment, Medial and Posterior Segment, and Lateral Segment of Right Lower Lobe    Prior to Admission medications   Medication Sig Start Date End Date Taking? Authorizing Provider  acetaminophen (TYLENOL) 325 MG tablet Take 650 mg by mouth every 6 (six) hours as needed.   Yes Historical Provider, MD  albuterol (PROVENTIL HFA;VENTOLIN HFA) 108 (90 BASE) MCG/ACT inhaler Inhale 2 puffs into the lungs every 6 (six) hours as needed for wheezing or shortness of breath.   Yes Historical Provider, MD  Alirocumab (PRALUENT) 75 MG/ML SOPN Inject 1 mL into the skin every 14 (fourteen) days.   Yes Historical Provider, MD  ALPRAZolam (Duanne Moron 0.5 MG tablet Take 0.5 mg by mouth 3 (three) times daily as needed for anxiety.   Yes Historical Provider, MD  aspirin EC 81 MG tablet Take 81 mg by mouth daily.   Yes Historical Provider, MD  clopidogrel (PLAVIX) 75 MG tablet Take 75 mg by mouth daily.   Yes Historical Provider, MD  docusate sodium (COLACE) 50 MG capsule Take 50 mg by mouth 1 day or 1 dose.   Yes Historical Provider, MD  Fluticasone Furoate-Vilanterol (BREO ELLIPTA) 200-25 MCG/INH AEPB Inhale 1 puff into the lungs daily.   Yes Historical Provider, MD  HYDROcodone-acetaminophen (NORCO) 10-325 MG per tablet Take 1 tablet by mouth every 6 (six) hours as needed for moderate pain.   Yes Historical Provider, MD  ipratropium (ATROVENT) 0.02 % nebulizer solution Take 0.5 mg by nebulization 4 (four) times daily.   Yes Historical Provider, MD  Alirocumab 75 MG/ML SOPN Inject 1 mL into the skin every 14 (fourteen) days.     Historical Provider, MD  Fluticasone Furoate 200 MCG/ACT AEPB Inhale 200 mcg into the lungs.    Historical Provider, MD  GUAIFENESIN PO Take 400 mg by mouth every 12 (twelve) hours.    Historical Provider, MD    Scheduled Meds: . aspirin EC  81 mg Oral Daily  . bisacodyl  10 mg Oral Daily  . budesonide  0.5 mg Nebulization BID  . feeding supplement  1 Container Oral Q24H  . feeding  supplement (GLUCERNA SHAKE)  237 mL Oral BID BM  . Fluticasone Furoate-Vilanterol  1 puff Inhalation Daily  . folic acid  1 mg Oral Daily  . ipratropium  0.5 mg Nebulization QID  . pantoprazole  40 mg Oral Daily  . senna-docusate  1 tablet Oral QHS   Infusions:   PRN Meds: albuterol, ALPRAZolam, diphenhydrAMINE, HYDROcodone-acetaminophen, potassium chloride, traMADol   Allergies as of 03/10/2015 - Review Complete 03/09/2015  Allergen Reaction Noted  . Codeine Itching 11/15/2014  . Rosuvastatin  03/09/2015  . Sympathomimetics  03/09/2015    Family History  Problem Relation Age of Onset  . Diabetes Mother   . Cancer Father     colon  . Stroke Sister   . Cancer Brother     colon cancer  . Stroke Sister   . Cancer Sister     lung cancer  . Heart disease Brother   . Cerebrovascular Disease Brother   . Heart disease Brother   . Cerebrovascular Disease Brother     Social History   Social History  . Marital Status: Married    Spouse Name: N/A  . Number of Children: N/A  . Years of Education: N/A   Occupational History  . Not on file.   Social History Main Topics  . Smoking status: Current Every Day Smoker -- 0.50 packs/day for 40 years    Types: Cigarettes  . Smokeless tobacco: Never Used     Comment: down to 4 aday  . Alcohol Use: No  . Drug Use: No  . Sexual Activity: Not on file   Other Topics Concern  . Not on file   Social History Narrative    REVIEW OF SYSTEMS: Constitutional:  She lost almost 10 pounds during her chemotherapy earlier this year but managed to gain most of her weight back to her norm in the low 100s, prior to surgery. ENT:  Streaking of blood in the nasal discharge over the weekend, this is resolved. Pulm:  Per HPI CV:  No palpitations, no LE edema.  GU:  No hematuria, no frequency.  Her partial hysterectomy, remotely, was performed due to bleeding from uterine fibroids GI:  Per HPI Heme:  No recent issues with anemia. No previous  transfusions.   Transfusions:  none Neuro:  No headaches, no peripheral tingling or numbness Derm:  No itching, no rash or sores.  Endocrine:  No sweats or chills.  No polyuria or dysuria Immunization:  Reviewed immunization records, none recorded. Did not inquire as to current immunization status. Travel:  None beyond local counties in last few months.    PHYSICAL EXAM: Vital signs in last 24 hours: Filed Vitals:   04/14/15 0853  BP:   Pulse: 103  Temp:   Resp: 18   Wt Readings from Last 3 Encounters:  03/29/15 102 lb 1.2 oz (46.3 kg)  03/23/15 102 lb 5 oz (46.409 kg)  03/09/15 104 lb (47.174 kg)    General: Pleasant, somewhat pale, thin and somewhat chronically ill-appearing but not acutely ill. Head:  No swelling, no signs of head trauma, no asymmetry  Eyes:  No scleral icterus, conjunctiva not pale. Ears:  Not hard of hearing.  Nose:  No congestion or nasal discharge. No visible blood in the nasal passages Mouth:  Clear, moist, pink oral mucosa. Neck:  No mass, no JVD, no TMG. Lungs:  Clear bilaterally. Chest tube in the right lateral thorax with some bloody discharge Heart: RRR. No MRG. S1/S2 audible. Abdomen:  Often.  NT   ND.   Rectal: Observed formed, large, black stool in the commode. Same stool seen on rectal exam. No masses. Stool is FOBT positive.   Musc/Skeltl: Some arthritic changes in the fingers/hands. Slight spinal kyphosis Extremities:  No CCE. Feet are warm and well perfuse.  Neurologic:  Alert, oriented 3. Moves all 4 limbs, strength not tested. No tremor. No gross neurologic deficits Skin:  No telangiectasia, no sores, no significant hematomas. Tattoos:  None seen Nodes:  No cervical or inguinal adenopathy.  Psych:  Pleasant, calm, good historian. Affect not depressed.  Intake/Output from previous day: 09/28 0701 - 09/29 0700 In: 120 [P.O.:120] Out: 40 [Chest Tube:40] Intake/Output this shift: Total I/O In: 240 [P.O.:240] Out: -   LAB  RESULTS:  Recent Labs  04/11/15 1435 04/13/15 1710  WBC 6.8 6.8  HGB 8.9* 7.8*  HCT 27.4* 24.1*  PLT 481* 436*   BMET Lab Results  Component Value Date   NA 142 04/11/2015   NA 136 03/31/2015   NA 137 03/30/2015   K 3.8 04/11/2015   K 4.1 03/31/2015   K 4.0 03/30/2015   CL 107 04/11/2015   CL 100* 03/31/2015   CL 103 03/30/2015   CO2 25 04/11/2015   CO2 26 03/31/2015   CO2 26 03/30/2015   GLUCOSE 171* 04/11/2015   GLUCOSE 121* 03/31/2015   GLUCOSE 110* 03/30/2015   BUN 15 04/11/2015   BUN 11 03/31/2015   BUN 8 03/30/2015   CREATININE 0.48 04/11/2015   CREATININE 0.50 03/31/2015   CREATININE 0.52 03/30/2015   CALCIUM 8.9 04/11/2015   CALCIUM 9.2 03/31/2015   CALCIUM 8.8* 03/30/2015   LFT No results for input(s): PROT, ALBUMIN, AST, ALT, ALKPHOS, BILITOT, BILIDIR, IBILI in the last 72 hours. PT/INR Lab Results  Component Value Date   INR 1.03 03/23/2015   INR 0.94 04/19/2009   Hepatitis Panel No results for input(s): HEPBSAG, HCVAB, HEPAIGM, HEPBIGM in the last 72 hours. C-Diff No components found for: CDIFF Lipase     Component Value Date/Time   LIPASE 35 04/19/2009 0950    Drugs of Abuse  No results found for: LABOPIA, COCAINSCRNUR, LABBENZ, AMPHETMU, THCU, LABBARB   RADIOLOGY STUDIES: Dg Chest Port 1 View  04/14/2015   CLINICAL DATA:  Pneumothorax  EXAM: PORTABLE CHEST 1 VIEW  COMPARISON:  04/13/2015  FINDINGS: Interval increase in right pneumothorax which is now approximately 50%. Right chest tube remains in place. Intra bronchial valves are noted on the right and unchanged  Left lung is clear.  No heart failure or effusion.  IMPRESSION: 50% right pneumothorax has increased considerably since yesterday. This is consistent with air leak.  These results were called by telephone at the time of interpretation on 04/14/2015 at 7:35 am to [facility] , who verbally acknowledged these results.   Electronically Signed   By: Franchot Gallo M.D.   On: 04/14/2015  07:36   Dg Chest Port 1 View  04/13/2015   CLINICAL DATA:  Chest tube  EXAM: PORTABLE CHEST 1 VIEW  COMPARISON:  04/12/2015  FINDINGS: Right chest tube remains in place with stable small right apical pneumothorax. Postoperative changes on the right. Heart is normal size. No confluent opacities. No visible effusions or acute bony abnormality.  IMPRESSION: Stable small right apical pneumothorax. Stable position of the right chest tube.   Electronically Signed   By: Rolm Baptise M.D.   On: 04/13/2015 07:42   Dg Chest Port 1 View  04/12/2015   CLINICAL DATA:  Z78.9 (ICD-10-CM) - Chest tube in place  EXAM: PORTABLE CHEST 1 VIEW  COMPARISON:  04/11/2015  FINDINGS: Portable exam is performed, showing unchanged position of right-sided chest tube. Right apical pneumothorax is again identified, slightly decreased in volume and estimated to be 10% of lung volume. Postoperative changes in the right hilar region. Left lung is clear. Heart size is normal.  IMPRESSION: Persistent but smaller right-sided pneumothorax.   Electronically Signed   By: Nolon Nations M.D.   On: 04/12/2015 12:38  ENDOSCOPIC STUDIES: None ever  IMPRESSION:   *  Painless hematochezia. strong family hx colon cancer in her dad and brother.   *  [facility] lung cancer, preop chemo.  RUL resection 9/9.  Persistent air leak/ptx.   *  MI 2001, s/p stenting.  Chronic plavix stopped today.  Last dose 9/28  *  Postoperative, normocytic anemia.  Has not required blood transfusions.  Bleeding from the chest tube likely contributing. Blood counts down but not dramatically reduced since the incidence of GI bleeding starting yesterday.    PLAN:     *  Needs colonoscopy and probably EGD.  Probably best served to do this after Plavix off for 5 days, so set for 10/3 nest week.   *  Unnecessary to obtain repeated Hemoccults. They are likely to be positive for the next few days and will not alter management.  *  For now will allow her to  continue on her regular solid diet. Once the timing of colonoscopy/EGD is determined, we will begin clear liquids 24 hours prior.  *  Extensive questioning from the husband was answered.   Azucena Freed  04/14/2015, 10:35 AM Pager: [phone]

## 2015-04-15 ENCOUNTER — Inpatient Hospital Stay (HOSPITAL_COMMUNITY): Payer: Medicaid Other

## 2015-04-15 MED ORDER — PEG-KCL-NACL-NASULF-NA ASC-C 100 G PO SOLR: 0.5000 | Freq: Once | ORAL | Status: AC

## 2015-04-15 MED ORDER — PEG-KCL-NACL-NASULF-NA ASC-C 100 G PO SOLR: 1.0000 | Freq: Once | ORAL | Status: DC

## 2015-04-15 MED ORDER — PEG-KCL-NACL-NASULF-NA ASC-C 100 G PO SOLR
0.5000 | Freq: Once | ORAL | Status: AC
  Administered 2015-04-17: 100 g via ORAL
  Filled 2015-04-15 (×2): qty 1

## 2015-04-15 NOTE — Progress Notes (Signed)
Title of Study: PATHOLOGY PROCUREMENT  Description: Customer service manager for the Discovery and Validation of Biomarkers for the Prediction, Diagnosis and Management of Disease  Principal Investigator: Enid Cutter, MD  Study Coordinator: Rodena Goldmann  IRB #: [id number]  Met with Ms. Dygert for 15 minutes to review IRB# [id number]. 1 family member present. The patient is eligible and qualifies for this study. Reviewed the consent/HIPAA form in detail and explained the purpose of the study, study procedures, potential risks, potential benefits, and alternatives to participation. All of the patient's questions were answered. The patient agreed to take part in the study and signed the consent/HIPAA document. Enrollment procedures completed. The patient was given a copy of the signed informed consent. Consent form loaded in Media Tab (03/23/15 Procedure Note - [email or web address].pdf)   Leftover tissue attempted, but not obtained. Insufficient for research collection.   (7-Sept-2016)  [place]  Cell [phone]

## 2015-04-15 NOTE — Progress Notes (Signed)
Daily Rounding Note  04/15/2015, 9:00 AM  LOS: 21 days   SUBJECTIVE:       Some bloody stool last night,  None since.  No n/v, on regular diet.  No abd pain.  CT remains to suction  OBJECTIVE:         Vital signs in last 24 hours:    Temp:  [97.6 F (36.4 C)-98.3 F (36.8 C)] 98 F (36.7 C) (09/30 0738) Pulse Rate:  [74-111] 74 (09/30 0400) Resp:  [13-30] 13 (09/30 0400) BP: (89-100)/(56-68) 91/58 mmHg (09/30 0400) SpO2:  [94 %-100 %] 99 % (09/30 0849) Last BM Date: 04/14/15 Filed Weights   03/26/15 0500 03/27/15 0600 03/29/15 0600  Weight: 105 lb 12.8 oz (47.991 kg) 104 lb 0.9 oz (47.2 kg) 102 lb 1.2 oz (46.3 kg)   General: pleasant, thin, pale.  Looks ill   Heart: RRR Chest: clear bil with reduced sounds on right.  CT on right. Abdomen: soft, thin, NT, ND.  BS active  Extremities: no CCE Neuro/Psych:  Pleasant, alert, calm.  Oriented x 3.   Intake/Output from previous day: 09/29 0701 - 09/30 0700 In: 360 [P.O.:360] Out: 0   Intake/Output this shift:    Lab Results:  Recent Labs  04/13/15 1710 04/14/15 1033  WBC 6.8 7.1  HGB 7.8* 8.2*  HCT 24.1* 25.4*  PLT 436* 448*   BMET  Recent Labs  04/14/15 1033  NA 142  K 4.1  CL 104  CO2 30  GLUCOSE 103*  BUN 11  CREATININE 0.55  CALCIUM 9.2   LFT No results for input(s): PROT, ALBUMIN, AST, ALT, ALKPHOS, BILITOT, BILIDIR, IBILI in the last 72 hours. PT/INR No results for input(s): LABPROT, INR in the last 72 hours. Hepatitis Panel No results for input(s): HEPBSAG, HCVAB, HEPAIGM, HEPBIGM in the last 72 hours.  Studies/Results: Dg Chest Port 1 View  04/15/2015   CLINICAL DATA:  Status post right lobectomy for malignancy, history of COPD, coronary artery disease, current smoker  EXAM: PORTABLE CHEST 1 VIEW  COMPARISON:  Portable chest x-ray of April 14, 2015  FINDINGS: There has been partial re-expansion of the right lung. There remains an  approximately 15-20% pneumothorax. The right chest tube tip projects over the posterior aspect of the third rib. There is no pleural effusion. There is no significant mediastinal shift. The left lung is clear. The heart and mediastinal structures are unremarkable. There surgical clips in the right hilar region. The bony thorax is unremarkable.  IMPRESSION: Interval partial re-expansion of the right lung with persistent 15-20% pneumothorax. The right chest tube is in reasonable position.   Electronically Signed   By: David  Martinique M.D.   On: 04/15/2015 07:45   Dg Chest Port 1 View  04/14/2015   CLINICAL DATA:  Pneumothorax  EXAM: PORTABLE CHEST 1 VIEW  COMPARISON:  04/13/2015  FINDINGS: Interval increase in right pneumothorax which is now approximately 50%. Right chest tube remains in place. Intra bronchial valves are noted on the right and unchanged  Left lung is clear.  No heart failure or effusion.  IMPRESSION: 50% right pneumothorax has increased considerably since yesterday. This is consistent with air leak.  These results were called by telephone at the time of interpretation on 04/14/2015 at 7:35 am to [facility] , who verbally acknowledged these results.   Electronically Signed   By: Franchot Gallo M.D.   On: 04/14/2015 07:36   Scheduled Meds: . aspirin  EC  81 mg Oral Daily  . bisacodyl  10 mg Oral Daily  . budesonide  0.5 mg Nebulization BID  . feeding supplement  1 Container Oral Q24H  . feeding supplement (GLUCERNA SHAKE)  237 mL Oral BID BM  . Fluticasone Furoate-Vilanterol  1 puff Inhalation Daily  . folic acid  1 mg Oral Daily  . ipratropium  0.5 mg Nebulization QID  . pantoprazole  40 mg Oral Daily  . senna-docusate  1 tablet Oral QHS   Continuous Infusions:  PRN Meds:.albuterol, ALPRAZolam, diphenhydrAMINE, HYDROcodone-acetaminophen, potassium chloride, traMADol  ASSESMENT:   * Painless hematochezia. strong family hx colon cancer in her dad and brother. Off lovenox and plavix.    * [facility] lung cancer, preop chemo. RUL resection 9/9. Persistent air leak/ptx.   * MI 2001, s/p stenting. Chronic plavix stopped. Last dose 9/28  *  Post-op, normocytic anemia. Hgb improved and overall stable.  No transfusions to date.  Bleeding from the chest tube likely contributing.     PLAN   *  Set her up for colonoscopy on Monday 10/3 at 9 AM.  No UGI sxs so no EGD. Pt aware and looking forward to having all medical issues resolved so she can discharge home.      Azucena Freed  04/15/2015, 9:00 AM Pager: [phone]  GI Attending Note  I have personally taken an interval history, reviewed the chart, and examined the patient.  I agree with the extender's note, impression and recommendations.  Sandy Salaam. Deatra Ina, MD, [facility] [phone]

## 2015-04-15 NOTE — Op Note (Signed)
NAMEDI, JASMER NO.:  [id number]  MEDICAL RECORD NO.:  [id number]  LOCATION:  3S11C                        FACILITY:  [facility]  PHYSICIAN:  Lanelle Bal, MD    DATE OF BIRTH:  23-May-1961  DATE OF PROCEDURE:  04/12/2015 DATE OF DISCHARGE:                              OPERATIVE REPORT   PREOPERATIVE DIAGNOSIS:  Persistent air leak, status post right upper lobectomy.  POSTOPERATIVE DIAGNOSIS:  Persistent air leak, status post right upper lobectomy.  SURGICAL PROCEDURE:  Bronchoscopy with placement of pulmonary intrabronchial valves x3.  SURGEON:  Lanelle Bal, M.D.  BRIEF HISTORY:  The patient is a 54 year old female, who presented with a right upper lobe lung cancer, initially staged as a 3A.  She was treated in [place] with chemotherapy with dramatic response.  After followup scan, she was referred for consideration of surgery and 15 days prior, had undergone a right upper lobectomy and node dissection with negative nodes.  The patient had been doing well postoperatively with exception of a persistent air leak and in spite of attempts to decrease suction, she continued with the air leak and it was recommended to her that we proceed with placement of intrabronchial valves to decrease or eliminate the air leak in an attempt to remove her remaining chest tube. She agreed and signed informed consent.  DESCRIPTION OF PROCEDURE:  The patient underwent general endotracheal anesthesia without incident.  After appropriate time-out, a video bronchoscopy was performed.  The scope was placed first in the left tracheobronchial tree.  There were very little secretions.  No erythema or intraluminal obstructions.  We then examined carefully the right tracheobronchial tree.  Specifically, the right upper lobe bronchial stump appeared well healed without evidence of breakdown or leak at this site.  The middle lobe and lower lobe bronchi appeared widely  patent without endobronchial lesions.  Through the scope, we then systematically blocked airways to determine which airways were contributing the most of the leak.  In this fashion, it became apparent that the blocking the middle lobe bronchus had no affect on the air leak.  Blocking the lower lobe bronchus completely eliminated the air leak.  We then blocked the superior segment bronchus with a marked decrease, although this did not completely eliminate it.  We measured the size of the superior segment of the right lower lobe bronchus and deployed a 7 mm valve at this site.  The valve seated well.  We then continued and although this decreased the air leak, it did not eliminate it.  We occluded the segment of the posterior and medial bronchial segments.  This again decreased the air leak and a second 7 mm valve was deployed.  A third valve was placed in the lateral segment of the right lower lobe.  With the 3 valves in place, we had an almost complete elimination of the air leak and did not feel that further, although it was not 100% eliminated.  We stopped at this point, examined each of the valves and each were well seated, and the scope was removed.  The patient was extubated in the operating room and transferred to the recovery room having tolerated the procedure without obvious complication.  In the  patient's brief OP note is a copy of the tracheobronchial tree in exact locations of each of the valves.     Lanelle Bal, MD     EG/MEDQ  D:  04/13/2015  T:  04/13/2015  Job:  [id number]

## 2015-04-15 NOTE — Progress Notes (Signed)
Utilization review completed.  

## 2015-04-15 NOTE — Progress Notes (Addendum)
      [address]       [place] [address]             [phone]      3 Days Post-Op Procedure(s) (LRB): VIDEO BRONCHOSCOPY WITH INSERTION OF 7MM INTERBRONCHIAL VALVES (IBV) IN SUPERIOR SEGMENT, MEDIAL AND POSTERIOR SEGMENT, AND LATERAL SEGMENT OF RIGHT LOWER LOBE (N/A)   Subjective:  Continues to be frustrated.  Wonders what is causing her lung to continually drop.  Objective: Vital signs in last 24 hours: Temp:  [97.6 F (36.4 C)-98.3 F (36.8 C)] 98 F (36.7 C) (09/30 0738) Pulse Rate:  [74-111] 74 (09/30 0400) Cardiac Rhythm:  [-] Normal sinus rhythm (09/30 0716) Resp:  [13-30] 13 (09/30 0400) BP: (89-100)/(56-68) 91/58 mmHg (09/30 0400) SpO2:  [94 %-100 %] 99 % (09/30 0849)  Intake/Output from previous day: 09/29 0701 - 09/30 0700 In: 360 [P.O.:360] Out: 0   General appearance: alert, cooperative and no distress Heart: regular rate and rhythm Lungs: clear to auscultation bilaterally Abdomen: soft, non-tender; bowel sounds normal; no masses,  no organomegaly Extremities: extremities normal, atraumatic, no cyanosis or edema Wound: clean and dry  Lab Results:  Recent Labs  04/13/15 1710 04/14/15 1033  WBC 6.8 7.1  HGB 7.8* 8.2*  HCT 24.1* 25.4*  PLT 436* 448*   BMET:  Recent Labs  04/14/15 1033  NA 142  K 4.1  CL 104  CO2 30  GLUCOSE 103*  BUN 11  CREATININE 0.55  CALCIUM 9.2    PT/INR: No results for input(s): LABPROT, INR in the last 72 hours. ABG    Component Value Date/Time   PHART 7.390 03/26/2015 0415   HCO3 25.8* 03/26/2015 0415   TCO2 27.1 03/26/2015 0415   ACIDBASEDEF 0.2 03/23/2015 1353   O2SAT 99.1 03/26/2015 0415   CBG (last 3)  No results for input(s): GLUCAP in the last 72 hours.  Assessment/Plan: S/P Procedure(s) (LRB): VIDEO BRONCHOSCOPY WITH INSERTION OF 7MM INTERBRONCHIAL VALVES (IBV) IN SUPERIOR SEGMENT, MEDIAL AND POSTERIOR SEGMENT, AND LATERAL SEGMENT OF RIGHT LOWER LOBE (N/A)  1. CV-  hemodynamically stable 2. Pulm- continued air leak with cough- CXR with 10-15% pneumothorax, will leave chest tube on suction 3. GI Bleed- + occult blood in stool, another bloody BM last night- off anticoagulants, GI consult appreciated plan for colonoscopy next week 4. Dispo- patient stable, chest tube remains on suction, GI following   LOS: 21 days    BARRETT, ERIN 04/15/2015  Lung better inflated today back on suction still with air leak with cough Will get ct of chest to check position of valves and or site of leak Evaluation for gi bleed precedent  Now, h/h stable

## 2015-04-16 ENCOUNTER — Inpatient Hospital Stay (HOSPITAL_COMMUNITY): Payer: Medicaid Other

## 2015-04-16 NOTE — Progress Notes (Addendum)
      [address]       [place] [address]             [phone]      4 Days Post-Op Procedure(s) (LRB): VIDEO BRONCHOSCOPY WITH INSERTION OF 7MM INTERBRONCHIAL VALVES (IBV) IN SUPERIOR SEGMENT, MEDIAL AND POSTERIOR SEGMENT, AND LATERAL SEGMENT OF RIGHT LOWER LOBE (N/A)   Subjective:  Ms. Heward complains of discomfort at chest tube site.  She states it feels like something is pulling at her skin.  No more BM since Thursday evening  Objective: Vital signs in last 24 hours: Temp:  [97.9 F (36.6 C)-98.4 F (36.9 C)] 98 F (36.7 C) (10/01 0742) Pulse Rate:  [79-105] 105 (10/01 0745) Cardiac Rhythm:  [-] Sinus tachycardia (10/01 0745) Resp:  [11-27] 17 (10/01 0745) BP: (89-98)/(56-60) 89/58 mmHg (10/01 0745) SpO2:  [98 %-100 %] 99 % (10/01 0745)  Intake/Output from previous day: 09/30 0701 - 10/01 0700 In: 940 [P.O.:940] Out: 110 [Chest Tube:110] Intake/Output this shift: Total I/O In: 240 [P.O.:240] Out: 0   General appearance: alert, cooperative and no distress Heart: regular rate and rhythm Lungs: clear to auscultation bilaterally Abdomen: soft, non-tender; bowel sounds normal; no masses,  no organomegaly Wound: clean andd ry  Lab Results:  Recent Labs  04/13/15 1710 04/14/15 1033  WBC 6.8 7.1  HGB 7.8* 8.2*  HCT 24.1* 25.4*  PLT 436* 448*   BMET:  Recent Labs  04/14/15 1033  NA 142  K 4.1  CL 104  CO2 30  GLUCOSE 103*  BUN 11  CREATININE 0.55  CALCIUM 9.2    PT/INR: No results for input(s): LABPROT, INR in the last 72 hours. ABG    Component Value Date/Time   PHART 7.390 03/26/2015 0415   HCO3 25.8* 03/26/2015 0415   TCO2 27.1 03/26/2015 0415   ACIDBASEDEF 0.2 03/23/2015 1353   O2SAT 99.1 03/26/2015 0415   CBG (last 3)  No results for input(s): GLUCAP in the last 72 hours.  Assessment/Plan: S/P Procedure(s) (LRB): VIDEO BRONCHOSCOPY WITH INSERTION OF 7MM INTERBRONCHIAL VALVES (IBV) IN SUPERIOR SEGMENT, MEDIAL AND  POSTERIOR SEGMENT, AND LATERAL SEGMENT OF RIGHT LOWER LOBE (N/A)  1.  CV- remains hemodynamically stable 2. Pulm- continued air leak, appears to be smaller today, CXR with small pneumothorax, will leave chest tube on suction 3. GI- + heme positive stool, plan is for colonoscopy Monday, appreciate GI assistance 4. Dispo- patient stable, continue chest tube to suction   LOS: 22 days    BARRETT, ERIN 04/16/2015  I have seen and examined the patient and agree with the assessment and plan as outlined.  Rexene Alberts, MD 04/16/2015 11:02 AM

## 2015-04-17 ENCOUNTER — Inpatient Hospital Stay (HOSPITAL_COMMUNITY): Payer: Medicaid Other

## 2015-04-17 MED ORDER — SODIUM CHLORIDE 0.9 % IV SOLN: INTRAVENOUS | Status: DC

## 2015-04-17 NOTE — Progress Notes (Addendum)
      [address]       [place] [address]             [phone]      5 Days Post-Op Procedure(s) (LRB): VIDEO BRONCHOSCOPY WITH INSERTION OF 7MM INTERBRONCHIAL VALVES (IBV) IN SUPERIOR SEGMENT, MEDIAL AND POSTERIOR SEGMENT, AND LATERAL SEGMENT OF RIGHT LOWER LOBE (N/A)   Subjective:  Michelle King has no new complaints.   Objective: Vital signs in last 24 hours: Temp:  [98 F (36.7 C)-98.2 F (36.8 C)] 98 F (36.7 C) (10/02 0332) Pulse Rate:  [79-102] 102 (10/02 0725) Cardiac Rhythm:  [-] Normal sinus rhythm (10/02 0756) Resp:  [11-18] 18 (10/02 0725) BP: (84-112)/(55-80) 84/55 mmHg (10/02 0725) SpO2:  [96 %-100 %] 97 % (10/02 0905)  Intake/Output from previous day: 10/01 0701 - 10/02 0700 In: 480 [P.O.:480] Out: 50 [Chest Tube:50] Intake/Output this shift: Total I/O In: 240 [P.O.:240] Out: 0   General appearance: alert, cooperative and no distress Heart: regular rate and rhythm Lungs: clear to auscultation bilaterally Abdomen: soft, non-tender; bowel sounds normal; no masses,  no organomegaly Wound: clean andd ry  Lab Results:  Recent Labs  04/14/15 1033  WBC 7.1  HGB 8.2*  HCT 25.4*  PLT 448*   BMET:  Recent Labs  04/14/15 1033  NA 142  K 4.1  CL 104  CO2 30  GLUCOSE 103*  BUN 11  CREATININE 0.55  CALCIUM 9.2    PT/INR: No results for input(s): LABPROT, INR in the last 72 hours. ABG    Component Value Date/Time   PHART 7.390 03/26/2015 0415   HCO3 25.8* 03/26/2015 0415   TCO2 27.1 03/26/2015 0415   ACIDBASEDEF 0.2 03/23/2015 1353   O2SAT 99.1 03/26/2015 0415   CBG (last 3)  No results for input(s): GLUCAP in the last 72 hours.  Assessment/Plan: S/P Procedure(s) (LRB): VIDEO BRONCHOSCOPY WITH INSERTION OF 7MM INTERBRONCHIAL VALVES (IBV) IN SUPERIOR SEGMENT, MEDIAL AND POSTERIOR SEGMENT, AND LATERAL SEGMENT OF RIGHT LOWER LOBE (N/A)  1. Chest tube- persistent air leak, CXR shows improvement of pneumothorax which is  now minimal- will leave chest tube on suction 2. Pulm-no acute issues, continue IS 3. GI- + heme positive stool, Colonoscopy tomorrow 4. Dispo- persistent air leak, leave chest tube to suction, colonoscopy tomorrow   LOS: 23 days    King, Michelle 04/17/2015  I have seen and examined the patient and agree with the assessment and plan as outlined.  Probably should recheck Hgb/Hct  Rexene Alberts, MD 04/17/2015 11:27 AM

## 2015-04-18 ENCOUNTER — Inpatient Hospital Stay (HOSPITAL_COMMUNITY): Payer: Medicaid Other | Admitting: Anesthesiology

## 2015-04-18 ENCOUNTER — Encounter (HOSPITAL_COMMUNITY): Payer: Self-pay | Admitting: Anesthesiology

## 2015-04-18 ENCOUNTER — Inpatient Hospital Stay (HOSPITAL_COMMUNITY): Payer: Medicaid Other

## 2015-04-18 ENCOUNTER — Encounter (HOSPITAL_COMMUNITY)
Admission: RE | Disposition: A | Discharge status: Home / Self Care | Payer: Self-pay | Source: Ambulatory Visit | Attending: Cardiothoracic Surgery

## 2015-04-18 DIAGNOSIS — Z800 Family history of malignant neoplasm of digestive organs: Secondary | ICD-10-CM

## 2015-04-18 HISTORY — PX: COLONOSCOPY: SHX5424

## 2015-04-18 LAB — COMPREHENSIVE METABOLIC PANEL
ALT: 10 U/L — ABNORMAL LOW (ref 14–54)
AST: 17 U/L (ref 15–41)
Albumin: 3.1 g/dL — ABNORMAL LOW (ref 3.5–5.0)
Alkaline Phosphatase: 45 U/L (ref 38–126)
Anion gap: 7 (ref 5–15)
BUN: 12 mg/dL (ref 6–20)
CO2: 27 mmol/L (ref 22–32)
Calcium: 8.5 mg/dL — ABNORMAL LOW (ref 8.9–10.3)
Chloride: 102 mmol/L (ref 101–111)
Creatinine, Ser: 0.61 mg/dL (ref 0.44–1.00)
GFR calc Af Amer: >60 mL/min (ref >60)
GFR calc non Af Amer: >60 mL/min (ref >60)
Glucose, Bld: 153 mg/dL — ABNORMAL HIGH (ref 65–99)
Potassium: 4.1 mmol/L (ref 3.5–5.1)
Sodium: 136 mmol/L (ref 135–145)
Total Bilirubin: 0.4 mg/dL (ref 0.3–1.2)
Total Protein: 5.1 g/dL — ABNORMAL LOW (ref 6.5–8.1)

## 2015-04-18 LAB — TYPE AND SCREEN
ABO/RH(D): O NEG
Antibody Screen: NEGATIVE

## 2015-04-18 LAB — CBC
HCT: 27.3 % — ABNORMAL LOW (ref 36.0–46.0)
HCT: 23.9 % — ABNORMAL LOW (ref 36.0–46.0)
Hemoglobin: 8.7 g/dL — ABNORMAL LOW (ref 12.0–15.0)
Hemoglobin: 7.6 g/dL — ABNORMAL LOW (ref 12.0–15.0)
MCH: 27.9 pg (ref 26.0–34.0)
MCH: 28 pg (ref 26.0–34.0)
MCHC: 31.9 g/dL (ref 30.0–36.0)
MCHC: 31.8 g/dL (ref 30.0–36.0)
MCV: 87.5 fL (ref 78.0–100.0)
MCV: 88.2 fL (ref 78.0–100.0)
PLATELETS: 393 10*3/uL (ref 150–400)
Platelets: 334 10*3/uL (ref 150–400)
RBC: 3.12 MIL/uL — ABNORMAL LOW (ref 3.87–5.11)
RBC: 2.71 MIL/uL — ABNORMAL LOW (ref 3.87–5.11)
RDW: 13.7 % (ref 11.5–15.5)
RDW: 13.7 % (ref 11.5–15.5)
WBC: 5.3 10*3/uL (ref 4.0–10.5)
WBC: 5.8 10*3/uL (ref 4.0–10.5)

## 2015-04-18 LAB — BASIC METABOLIC PANEL
Anion gap: 8 (ref 5–15)
BUN: 9 mg/dL (ref 6–20)
CALCIUM: 8.9 mg/dL (ref 8.9–10.3)
CO2: 27 mmol/L (ref 22–32)
CREATININE: 0.49 mg/dL (ref 0.44–1.00)
Chloride: 103 mmol/L (ref 101–111)
GFR calc Af Amer: >60 mL/min (ref >60)
GFR calc non Af Amer: >60 mL/min (ref >60)
GLUCOSE: 93 mg/dL (ref 65–99)
Potassium: 3.5 mmol/L (ref 3.5–5.1)
Sodium: 138 mmol/L (ref 135–145)

## 2015-04-18 LAB — PROTIME-INR
INR: 1.15 (ref 0.00–1.49)
Prothrombin Time: 14.9 seconds (ref 11.6–15.2)

## 2015-04-18 LAB — APTT: aPTT: 29 seconds (ref 24–37)

## 2015-04-18 SURGERY — COLONOSCOPY
Anesthesia: Monitor Anesthesia Care

## 2015-04-18 MED ORDER — FENTANYL CITRATE (PF) 100 MCG/2ML IJ SOLN
12.5000 ug | INTRAMUSCULAR | Status: DC | PRN
  Administered 2015-04-18 (×2): 12.5 ug via INTRAVENOUS
  Administered 2015-04-18: 25 ug via INTRAVENOUS
  Administered 2015-04-19: 50 ug via INTRAVENOUS
  Administered 2015-04-19 (×2): 25 ug via INTRAVENOUS
  Administered 2015-04-19: 50 ug via INTRAVENOUS
  Administered 2015-04-26 – 2015-05-05 (×12): 25 ug via INTRAVENOUS
  Filled 2015-04-18 (×16): qty 2

## 2015-04-18 MED ORDER — DEXTROSE 5 % IV SOLN
1.5000 g | INTRAVENOUS | Status: AC
  Administered 2015-04-19: 1.5 g via INTRAVENOUS
  Filled 2015-04-18: qty 1.5

## 2015-04-18 MED ORDER — LACTATED RINGERS IV SOLN
INTRAVENOUS | Status: DC
  Administered 2015-04-18: 1000 mL via INTRAVENOUS
  Administered 2015-04-18: 09:00:00 via INTRAVENOUS

## 2015-04-18 MED ORDER — PROMETHAZINE HCL 25 MG/ML IJ SOLN
6.2500 mg | INTRAMUSCULAR | Status: DC | PRN
Start: 2015-04-18 — End: 2015-04-19

## 2015-04-18 MED ORDER — PROPOFOL 500 MG/50ML IV EMUL
INTRAVENOUS | Status: DC | PRN
  Administered 2015-04-18: 75 ug/kg/min via INTRAVENOUS

## 2015-04-18 MED ORDER — FENTANYL CITRATE (PF) 100 MCG/2ML IJ SOLN
INTRAMUSCULAR | Status: AC
  Administered 2015-04-18: 12.5 ug via INTRAVENOUS
  Filled 2015-04-18: qty 2

## 2015-04-18 MED ORDER — MIDAZOLAM HCL 5 MG/5ML IJ SOLN
INTRAMUSCULAR | Status: DC | PRN
  Administered 2015-04-18: 2 mg via INTRAVENOUS

## 2015-04-18 MED ORDER — POTASSIUM CHLORIDE CRYS ER 20 MEQ PO TBCR
30.0000 meq | EXTENDED_RELEASE_TABLET | Freq: Once | ORAL | Status: AC
  Administered 2015-04-18: 30 meq via ORAL
  Filled 2015-04-18: qty 1

## 2015-04-18 MED ORDER — LIDOCAINE HCL (CARDIAC) 20 MG/ML IV SOLN
INTRAVENOUS | Status: DC | PRN
  Administered 2015-04-18: 60 mg via INTRAVENOUS

## 2015-04-18 NOTE — Anesthesia Preprocedure Evaluation (Signed)
Anesthesia Evaluation  Patient identified by MRN, date of birth, ID band Patient awake    Reviewed: Allergy & Precautions, NPO status , Patient's Chart, lab work & pertinent test results  Airway Mallampati: II  TM Distance: >3 FB Neck ROM: Full    Dental no notable dental hx.    Pulmonary COPD, Current Smoker,    Pulmonary exam normal breath sounds clear to auscultation       Cardiovascular + CAD and + Past MI  Normal cardiovascular exam Rhythm:Regular Rate:Normal     Neuro/Psych negative neurological ROS  negative psych ROS   GI/Hepatic negative GI ROS, Neg liver ROS,   Endo/Other  negative endocrine ROS  Renal/GU negative Renal ROS  negative genitourinary   Musculoskeletal negative musculoskeletal ROS (+)   Abdominal   Peds negative pediatric ROS (+)  Hematology  (+) anemia ,   Anesthesia Other Findings   Reproductive/Obstetrics negative OB ROS                             Anesthesia Physical Anesthesia Plan  ASA: III  Anesthesia Plan: MAC   Post-op Pain Management:    Induction: Intravenous  Airway Management Planned: Simple Face Mask  Additional Equipment:   Intra-op Plan:   Post-operative Plan:   Informed Consent: I have reviewed the patients History and Physical, chart, labs and discussed the procedure including the risks, benefits and alternatives for the proposed anesthesia with the patient or authorized representative who has indicated his/her understanding and acceptance.   Dental advisory given  Plan Discussed with: CRNA and Surgeon  Anesthesia Plan Comments:         Anesthesia Quick Evaluation

## 2015-04-18 NOTE — Progress Notes (Signed)
Pt continues to have difficulty with bowel prep have encourage and instructed pt on the need to finish prep in order to get bowels clear for procedure will continue to monitor

## 2015-04-18 NOTE — H&P (View-Only) (Signed)
Daily Rounding Note  04/15/2015, 9:00 AM  LOS: 21 days   SUBJECTIVE:       Some bloody stool last night,  None since.  No n/v, on regular diet.  No abd pain.  CT remains to suction  OBJECTIVE:         Vital signs in last 24 hours:    Temp:  [97.6 F (36.4 C)-98.3 F (36.8 C)] 98 F (36.7 C) (09/30 0738) Pulse Rate:  [74-111] 74 (09/30 0400) Resp:  [13-30] 13 (09/30 0400) BP: (89-100)/(56-68) 91/58 mmHg (09/30 0400) SpO2:  [94 %-100 %] 99 % (09/30 0849) Last BM Date: 04/14/15 Filed Weights   03/26/15 0500 03/27/15 0600 03/29/15 0600  Weight: 105 lb 12.8 oz (47.991 kg) 104 lb 0.9 oz (47.2 kg) 102 lb 1.2 oz (46.3 kg)   General: pleasant, thin, pale.  Looks ill   Heart: RRR Chest: clear bil with reduced sounds on right.  CT on right. Abdomen: soft, thin, NT, ND.  BS active  Extremities: no CCE Neuro/Psych:  Pleasant, alert, calm.  Oriented x 3.   Intake/Output from previous day: 09/29 0701 - 09/30 0700 In: 360 [P.O.:360] Out: 0   Intake/Output this shift:    Lab Results:  Recent Labs  04/13/15 1710 04/14/15 1033  WBC 6.8 7.1  HGB 7.8* 8.2*  HCT 24.1* 25.4*  PLT 436* 448*   BMET  Recent Labs  04/14/15 1033  NA 142  K 4.1  CL 104  CO2 30  GLUCOSE 103*  BUN 11  CREATININE 0.55  CALCIUM 9.2   LFT No results for input(s): PROT, ALBUMIN, AST, ALT, ALKPHOS, BILITOT, BILIDIR, IBILI in the last 72 hours. PT/INR No results for input(s): LABPROT, INR in the last 72 hours. Hepatitis Panel No results for input(s): HEPBSAG, HCVAB, HEPAIGM, HEPBIGM in the last 72 hours.  Studies/Results: Dg Chest Port 1 View  04/15/2015   CLINICAL DATA:  Status post right lobectomy for malignancy, history of COPD, coronary artery disease, current smoker  EXAM: PORTABLE CHEST 1 VIEW  COMPARISON:  Portable chest x-ray of April 14, 2015  FINDINGS: There has been partial re-expansion of the right lung. There remains an  approximately 15-20% pneumothorax. The right chest tube tip projects over the posterior aspect of the third rib. There is no pleural effusion. There is no significant mediastinal shift. The left lung is clear. The heart and mediastinal structures are unremarkable. There surgical clips in the right hilar region. The bony thorax is unremarkable.  IMPRESSION: Interval partial re-expansion of the right lung with persistent 15-20% pneumothorax. The right chest tube is in reasonable position.   Electronically Signed   By: David  Martinique M.D.   On: 04/15/2015 07:45   Dg Chest Port 1 View  04/14/2015   CLINICAL DATA:  Pneumothorax  EXAM: PORTABLE CHEST 1 VIEW  COMPARISON:  04/13/2015  FINDINGS: Interval increase in right pneumothorax which is now approximately 50%. Right chest tube remains in place. Intra bronchial valves are noted on the right and unchanged  Left lung is clear.  No heart failure or effusion.  IMPRESSION: 50% right pneumothorax has increased considerably since yesterday. This is consistent with air leak.  These results were called by telephone at the time of interpretation on 04/14/2015 at 7:35 am to [facility] , who verbally acknowledged these results.   Electronically Signed   By: Franchot Gallo M.D.   On: 04/14/2015 07:36   Scheduled Meds: . aspirin  EC  81 mg Oral Daily  . bisacodyl  10 mg Oral Daily  . budesonide  0.5 mg Nebulization BID  . feeding supplement  1 Container Oral Q24H  . feeding supplement (GLUCERNA SHAKE)  237 mL Oral BID BM  . Fluticasone Furoate-Vilanterol  1 puff Inhalation Daily  . folic acid  1 mg Oral Daily  . ipratropium  0.5 mg Nebulization QID  . pantoprazole  40 mg Oral Daily  . senna-docusate  1 tablet Oral QHS   Continuous Infusions:  PRN Meds:.albuterol, ALPRAZolam, diphenhydrAMINE, HYDROcodone-acetaminophen, potassium chloride, traMADol  ASSESMENT:   * Painless hematochezia. strong family hx colon cancer in her dad and brother. Off lovenox and plavix.    * [facility] lung cancer, preop chemo. RUL resection 9/9. Persistent air leak/ptx.   * MI 2001, s/p stenting. Chronic plavix stopped. Last dose 9/28  *  Post-op, normocytic anemia. Hgb improved and overall stable.  No transfusions to date.  Bleeding from the chest tube likely contributing.     PLAN   *  Set her up for colonoscopy on Monday 10/3 at 9 AM.  No UGI sxs so no EGD. Pt aware and looking forward to having all medical issues resolved so she can discharge home.      Azucena Freed  04/15/2015, 9:00 AM Pager: [phone]  GI Attending Note  I have personally taken an interval history, reviewed the chart, and examined the patient.  I agree with the extender's note, impression and recommendations.  Sandy Salaam. Deatra Ina, MD, [facility] [phone]

## 2015-04-18 NOTE — Progress Notes (Signed)
UR COMPLETED  

## 2015-04-18 NOTE — Transfer of Care (Signed)
Immediate Anesthesia Transfer of Care Note  Patient: Michelle King  Procedure(s) Performed: Procedure(s): COLONOSCOPY (N/A)  Patient Location: Endoscopy Unit  Anesthesia Type:MAC  Level of Consciousness: awake, alert , oriented and patient cooperative  Airway & Oxygen Therapy: Patient Spontanous Breathing and Patient connected to face mask oxygen  Post-op Assessment: Report given to RN and Post -op Vital signs reviewed and stable  Post vital signs: Reviewed and stable  Last Vitals:  Filed Vitals:   04/18/15 0934  BP: 78/45  Pulse: 83  Temp: 36.4 C  Resp: 20    Complications: No apparent anesthesia complications

## 2015-04-18 NOTE — Progress Notes (Signed)
Tap water enema completed, clear yellow result. Pt tolerated procedure well. Will continue to monitor.

## 2015-04-18 NOTE — Op Note (Signed)
[facility] [place], [address]   COLONOSCOPY PROCEDURE REPORT  PATIENT: Michelle, King  MR#: [id number] BIRTHDATE: [birth year] , 47  yrs. old GENDER: female ENDOSCOPIST: Ladene Artist, MD, [facility] REFERRED [id number] Servando Snare, M.D. PROCEDURE DATE:  04/18/2015 PROCEDURE:   Colonoscopy, diagnostic First Screening Colonoscopy - Avg.  risk and is 50 yrs.  old or older - No.  Prior Negative Screening - Now for repeat screening. N/A  History of Adenoma - Now for follow-up colonoscopy & has been > or = to 3 yrs.  N/A  Polyps removed today? No Recommend repeat exam, <10 yrs? Yes high risk ASA CLASS:   Class III INDICATIONS:Evaluation of unexplained GI bleeding, hematochezia, and FH Colon or Rectal Adenocarcinoma. MEDICATIONS: Monitored anesthesia care and Per Anesthesia DESCRIPTION OF PROCEDURE:   After the risks benefits and alternatives of the procedure were thoroughly explained, informed consent was obtained.  The digital rectal exam revealed no abnormalities of the rectum.   The Pentax Ped Colon [id number] endoscope was introduced through the anus and advanced to the cecum, which was identified by both the appendix and ileocecal valve. No adverse events experienced.   The quality of the prep was adequate good.  (MoviPrep was used)  The instrument was then slowly withdrawn as the colon was fully examined. Estimated blood loss is zero unless otherwise noted in this procedure report.    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   The examination was otherwise normal.  Retroflexed views revealed moderate sized internal Grade I hemorrhoids. The time to cecum = 4.5 Withdrawal time = 10.9   The scope was withdrawn and the procedure completed. COMPLICATIONS: There were no immediate complications.  ENDOSCOPIC IMPRESSION: 1.   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    Moderate size Grade I internal hemorrhoids  RECOMMENDATIONS: 1. Repeat Colonoscopy in 5 years. 2. Suspected hemorrhoidal bleeding. 3. Prep H supp daily as needed 4. OK to resume Plavix as indicated  eSigned:  Ladene Artist, MD, [facility] 04/18/2015 9:36 AM

## 2015-04-18 NOTE — Interval H&P Note (Signed)
History and Physical Interval Note:  04/18/2015 8:42 AM  Michelle King  has presented today for surgery, with the diagnosis of hematochezia, anemia, strong family hx of colon Ca in Dad and brother.  The various methods of treatment have been discussed with the patient and family. After consideration of risks, benefits and other options for treatment, the patient has consented to  Procedure(s): COLONOSCOPY (N/A) as a surgical intervention .  The patient's history has been reviewed, patient examined, no change in status, stable for surgery.  I have reviewed the patient's chart and labs.  Questions were answered to the patient's satisfaction.     Pricilla Riffle. Fuller Plan

## 2015-04-18 NOTE — Progress Notes (Addendum)
      [address]       [place] [address]             [phone]       Day of Surgery Procedure(s) (LRB): COLONOSCOPY (N/A)  Subjective: Patient frustrated with lung, but glad colonoscopy did not show cancer (as she has a strong family history).  Objective: Vital signs in last 24 hours: Temp:  [97.6 F (36.4 C)-99 F (37.2 C)] 98.6 F (37 C) (10/03 1300) Pulse Rate:  [78-113] 100 (10/03 1220) Cardiac Rhythm:  [-] Normal sinus rhythm (10/03 0749) Resp:  [14-25] 21 (10/03 1220) BP: (78-122)/(44-77) 89/47 mmHg (10/03 0950) SpO2:  [98 %-100 %] 99 % (10/03 1220) Weight:  [92 lb (41.731 kg)] 92 lb (41.731 kg) (10/03 0839)   Intake/Output from previous day: 10/02 0701 - 10/03 0700 In: 720 [P.O.:720] Out: 75 [Chest Tube:75]   Physical Exam:  Cardiovascular: Tachycardic Pulmonary: Slightly diminished on right; no rales, wheezes, or rhonchi. Abdomen: Soft, non tender, bowel sounds present. Wounds: Clean and dry.  No erythema or signs of infection. Chest Tube: to suction and there is an air leak that worsens with cough  Lab Results: CBC:  Recent Labs  04/18/15 0400  WBC 5.3  HGB 8.7*  HCT 27.3*  PLT 393   BMET:   Recent Labs  04/18/15 0400  NA 138  K 3.5  CL 103  CO2 27  GLUCOSE 93  BUN 9  CREATININE 0.49  CALCIUM 8.9    PT/INR: No results for input(s): LABPROT, INR in the last 72 hours. ABG:  INR: Will add last result for INR, ABG once components are confirmed Will add last 4 CBG results once components are confirmed  Assessment/Plan:  1. CV - SR in the 90's at this time. Per GI, ok to resume Plavix but will discuss with Dr. Servando Snare. 2.  Pulmonary - S/p bronch and EB valves. On room air. Chest tube is to suction and has had 75 cc of output last 24 hours. Still with air leak.   CT done yesterday showed advanced coronary atherosclerosis, right sided hydro pneumothorax, inter bronchial valves RLL. Intimate association of a row of  surgical sutures with the medial right middle lobe bronchus. Occult fistula at this site would be a possibility.CXR apparently shows no pneumothorax. 3. S/p colonoscopy earlier this am. Has internal hemorrhoid and 4 AV malformations in the cecum. 4. Supplement potassium 5. Dr. Servando Snare to evaluate  ZIMMERMAN,DONIELLE MPA-C 04/18/2015,1:11 PM   Results of colonoscopy noted. Ct scan of chest reviewed , tube in good position with lung not inflated completely Still with air leak on suction. Discussed with patient proceeding with bronchoscopy, get position of IBV valves, reposition or replace, if not effective proceed with right VATS and stapling of air leak and possible pleural tent. Risks and options discussed with patient and husband. She is agreeable. I have seen and examined Ned Card and agree with the above assessment  and plan.  Grace Isaac MD Beeper [phone] Office [phone] 04/18/2015 7:43 PM

## 2015-04-18 NOTE — Anesthesia Postprocedure Evaluation (Signed)
  Anesthesia Post-op Note  Patient: Michelle King  Procedure(s) Performed: Procedure(s) (LRB): COLONOSCOPY (N/A)  Patient Location: PACU  Anesthesia Type: MAC  Level of Consciousness: awake and alert   Airway and Oxygen Therapy: Patient Spontanous Breathing  Post-op Pain: mild  Post-op Assessment: Post-op Vital signs reviewed, Patient's Cardiovascular Status Stable, Respiratory Function Stable, Patent Airway and No signs of Nausea or vomiting  Last Vitals:  Filed Vitals:   04/18/15 0945  BP: 91/46  Pulse: 81  Temp:   Resp: 16    Post-op Vital Signs: stable   Complications: No apparent anesthesia complications

## 2015-04-18 NOTE — Progress Notes (Signed)
Pt having difficulty getting movi prep complete finished finally completed @ 2350

## 2015-04-19 ENCOUNTER — Encounter (HOSPITAL_COMMUNITY): Payer: Self-pay | Admitting: Gastroenterology

## 2015-04-19 ENCOUNTER — Inpatient Hospital Stay (HOSPITAL_COMMUNITY): Payer: Medicaid Other | Admitting: Certified Registered Nurse Anesthetist

## 2015-04-19 ENCOUNTER — Inpatient Hospital Stay (HOSPITAL_COMMUNITY): Payer: Medicaid Other

## 2015-04-19 ENCOUNTER — Encounter (HOSPITAL_COMMUNITY)
Admission: RE | Disposition: A | Discharge status: Home / Self Care | Payer: Self-pay | Source: Ambulatory Visit | Attending: Cardiothoracic Surgery

## 2015-04-19 DIAGNOSIS — J95811 Postprocedural pneumothorax: Secondary | ICD-10-CM

## 2015-04-19 HISTORY — PX: VIDEO BRONCHOSCOPY WITH INSERTION OF INTERBRONCHIAL VALVE (IBV): SHX6178

## 2015-04-19 LAB — BLOOD GAS, ARTERIAL
Acid-Base Excess: 0.7 mmol/L (ref 0.0–2.0)
Bicarbonate: 25.1 mEq/L — ABNORMAL HIGH (ref 20.0–24.0)
Drawn by: 44138
FIO2: 0.21
O2 Saturation: 97.1 %
Patient temperature: 98.6
TCO2: 26.4 mmol/L (ref 0–100)
pCO2 arterial: 42.8 mmHg (ref 35.0–45.0)
pH, Arterial: 7.387 (ref 7.350–7.450)
pO2, Arterial: 91.3 mmHg (ref 80.0–100.0)

## 2015-04-19 LAB — URINALYSIS, ROUTINE W REFLEX MICROSCOPIC
Bilirubin Urine: NEGATIVE
Glucose, UA: NEGATIVE mg/dL
Hgb urine dipstick: NEGATIVE
Ketones, ur: NEGATIVE mg/dL
Nitrite: POSITIVE — AB
Protein, ur: NEGATIVE mg/dL
Specific Gravity, Urine: 1.02 (ref 1.005–1.030)
Urobilinogen, UA: 0.2 mg/dL (ref 0.0–1.0)
pH: 6 (ref 5.0–8.0)

## 2015-04-19 LAB — URINE MICROSCOPIC-ADD ON

## 2015-04-19 SURGERY — BRONCHOSCOPY, FLEXIBLE, WITH INTRABRONCHIAL VALVE INSERTION
Anesthesia: General | Site: Chest | Laterality: Right

## 2015-04-19 MED ORDER — LACTATED RINGERS IV SOLN
INTRAVENOUS | Status: DC
  Administered 2015-04-19 (×2): via INTRAVENOUS

## 2015-04-19 MED ORDER — GUAIFENESIN ER 600 MG PO TB12
600.0000 mg | ORAL_TABLET | Freq: Two times a day (BID) | ORAL | Status: DC
  Administered 2015-04-19 – 2015-05-05 (×33): 600 mg via ORAL
  Filled 2015-04-19 (×35): qty 1

## 2015-04-19 MED ORDER — LIDOCAINE HCL (CARDIAC) 20 MG/ML IV SOLN
INTRAVENOUS | Status: AC
  Filled 2015-04-19: qty 10

## 2015-04-19 MED ORDER — PHENYLEPHRINE HCL 10 MG/ML IJ SOLN
INTRAMUSCULAR | Status: DC | PRN
  Administered 2015-04-19 (×3): 80 ug via INTRAVENOUS

## 2015-04-19 MED ORDER — LIDOCAINE HCL (CARDIAC) 20 MG/ML IV SOLN
INTRAVENOUS | Status: AC
  Filled 2015-04-19: qty 5

## 2015-04-19 MED ORDER — FENTANYL CITRATE (PF) 100 MCG/2ML IJ SOLN
INTRAMUSCULAR | Status: AC
  Administered 2015-04-19: 25 ug via INTRAVENOUS
  Filled 2015-04-19: qty 2

## 2015-04-19 MED ORDER — MIDAZOLAM HCL 5 MG/5ML IJ SOLN
INTRAMUSCULAR | Status: DC | PRN
  Administered 2015-04-19: 2 mg via INTRAVENOUS

## 2015-04-19 MED ORDER — CLOPIDOGREL BISULFATE 75 MG PO TABS
75.0000 mg | ORAL_TABLET | Freq: Every day | ORAL | Status: DC
  Administered 2015-04-19 – 2015-05-05 (×17): 75 mg via ORAL
  Filled 2015-04-19 (×17): qty 1

## 2015-04-19 MED ORDER — LIDOCAINE HCL (CARDIAC) 20 MG/ML IV SOLN
INTRAVENOUS | Status: DC | PRN
  Administered 2015-04-19: 40 mg via INTRAVENOUS

## 2015-04-19 MED ORDER — PROPOFOL 10 MG/ML IV BOLUS
INTRAVENOUS | Status: DC | PRN
  Administered 2015-04-19: 80 mg via INTRAVENOUS

## 2015-04-19 MED ORDER — ONDANSETRON HCL 4 MG/2ML IJ SOLN: 4.0000 mg | Freq: Once | INTRAMUSCULAR | Status: DC | PRN

## 2015-04-19 MED ORDER — ENOXAPARIN SODIUM 30 MG/0.3ML ~~LOC~~ SOLN
30.0000 mg | SUBCUTANEOUS | Status: DC
  Administered 2015-04-19 – 2015-04-27 (×9): 30 mg via SUBCUTANEOUS
  Filled 2015-04-19 (×10): qty 0.3

## 2015-04-19 MED ORDER — DEXTROSE 5 % IV SOLN
10.0000 mg | INTRAVENOUS | Status: DC | PRN
  Administered 2015-04-19: 40 ug/min via INTRAVENOUS

## 2015-04-19 MED ORDER — MIDAZOLAM HCL 2 MG/2ML IJ SOLN
INTRAMUSCULAR | Status: AC
  Filled 2015-04-19: qty 4

## 2015-04-19 MED ORDER — DEXAMETHASONE SODIUM PHOSPHATE 4 MG/ML IJ SOLN
INTRAMUSCULAR | Status: DC | PRN
  Administered 2015-04-19: 4 mg via INTRAVENOUS

## 2015-04-19 MED ORDER — ONDANSETRON HCL 4 MG/2ML IJ SOLN
INTRAMUSCULAR | Status: DC | PRN
  Administered 2015-04-19: 4 mg via INTRAVENOUS

## 2015-04-19 MED ORDER — FENTANYL CITRATE (PF) 100 MCG/2ML IJ SOLN
25.0000 ug | INTRAMUSCULAR | Status: DC | PRN
  Administered 2015-04-19: 50 ug via INTRAVENOUS

## 2015-04-19 MED ORDER — FENTANYL CITRATE (PF) 250 MCG/5ML IJ SOLN
INTRAMUSCULAR | Status: AC
  Filled 2015-04-19: qty 5

## 2015-04-19 MED ORDER — SUGAMMADEX SODIUM 200 MG/2ML IV SOLN
INTRAVENOUS | Status: AC
  Filled 2015-04-19: qty 2

## 2015-04-19 MED ORDER — ROCURONIUM BROMIDE 50 MG/5ML IV SOLN
INTRAVENOUS | Status: AC
  Filled 2015-04-19: qty 1

## 2015-04-19 MED ORDER — SUGAMMADEX SODIUM 200 MG/2ML IV SOLN
INTRAVENOUS | Status: DC | PRN
  Administered 2015-04-19: 100 mg via INTRAVENOUS

## 2015-04-19 MED ORDER — PROPOFOL 10 MG/ML IV BOLUS
INTRAVENOUS | Status: AC
  Filled 2015-04-19: qty 20

## 2015-04-19 MED ORDER — ONDANSETRON HCL 4 MG/2ML IJ SOLN
INTRAMUSCULAR | Status: AC
  Filled 2015-04-19: qty 2

## 2015-04-19 MED ORDER — ROCURONIUM BROMIDE 100 MG/10ML IV SOLN
INTRAVENOUS | Status: DC | PRN
  Administered 2015-04-19: 10 mg via INTRAVENOUS
  Administered 2015-04-19: 30 mg via INTRAVENOUS

## 2015-04-19 SURGICAL SUPPLY — 86 items
APPLICATOR TIP EXT COSEAL (VASCULAR PRODUCTS) IMPLANT
BLADE SURG 11 STRL SS (BLADE) IMPLANT
CANISTER SUCTION 2500CC (MISCELLANEOUS) ×3 IMPLANT
CATH BALLN 4FR (CATHETERS) ×3 IMPLANT
CATH EMB 4FR 80CM (CATHETERS) IMPLANT
CATH EMB 5FR 80CM (CATHETERS) ×3 IMPLANT
CATH KIT ON Q 5IN SLV (PAIN MANAGEMENT) IMPLANT
CATH LOADER DEPLOYMENT HUD (CATHETERS) ×3 IMPLANT
CATH THORACIC 28FR (CATHETERS) IMPLANT
CATH THORACIC 36FR (CATHETERS) IMPLANT
CATH THORACIC 36FR RT ANG (CATHETERS) IMPLANT
CLIP TI MEDIUM 6 (CLIP) IMPLANT
CONN ST 1/4X3/8  BEN (MISCELLANEOUS)
CONN ST 1/4X3/8 BEN (MISCELLANEOUS) IMPLANT
CONN Y 3/8X3/8X3/8  BEN (MISCELLANEOUS)
CONN Y 3/8X3/8X3/8 BEN (MISCELLANEOUS) IMPLANT
CONT SPEC 4OZ CLIKSEAL STRL BL (MISCELLANEOUS) ×6 IMPLANT
COVER SURGICAL LIGHT HANDLE (MISCELLANEOUS) ×3 IMPLANT
COVER TABLE BACK 60X90 (DRAPES) ×3 IMPLANT
DERMABOND ADVANCED (GAUZE/BANDAGES/DRESSINGS)
DERMABOND ADVANCED .7 DNX12 (GAUZE/BANDAGES/DRESSINGS) IMPLANT
DRAIN CHANNEL 28F RND 3/8 FF (WOUND CARE) IMPLANT
DRAPE LAPAROSCOPIC ABDOMINAL (DRAPES) ×3 IMPLANT
DRAPE WARM FLUID 44X44 (DRAPE) ×3 IMPLANT
DRILL BIT 7/64X5 (BIT) IMPLANT
DRSG AQUACEL AG ADV 3.5X14 (GAUZE/BANDAGES/DRESSINGS) ×3 IMPLANT
ELECT BLADE 4.0 EZ CLEAN MEGAD (MISCELLANEOUS) ×6
ELECT REM PT RETURN 9FT ADLT (ELECTROSURGICAL) ×3
ELECTRODE BLDE 4.0 EZ CLN MEGD (MISCELLANEOUS) ×4 IMPLANT
ELECTRODE REM PT RTRN 9FT ADLT (ELECTROSURGICAL) ×2 IMPLANT
FILTER STRAW FLUID ASPIR (MISCELLANEOUS) IMPLANT
FORCEPS BIOP RJ4 1.8 (CUTTING FORCEPS) IMPLANT
GAUZE SPONGE 4X4 12PLY STRL (GAUZE/BANDAGES/DRESSINGS) ×3 IMPLANT
GLOVE BIO SURGEON STRL SZ 6.5 (GLOVE) ×6 IMPLANT
GOWN STRL REUS W/ TWL LRG LVL3 (GOWN DISPOSABLE) ×6 IMPLANT
GOWN STRL REUS W/TWL LRG LVL3 (GOWN DISPOSABLE) ×3
HEMOSTAT SURGICEL 2X14 (HEMOSTASIS) IMPLANT
KIT AIRWAY SIZING W/B5-23 BALL (VALVE) ×3 IMPLANT
KIT BASIN OR (CUSTOM PROCEDURE TRAY) ×3 IMPLANT
KIT CLEAN ENDO COMPLIANCE (KITS) ×3 IMPLANT
KIT ROOM TURNOVER OR (KITS) ×3 IMPLANT
MARKER SKIN DUAL TIP RULER LAB (MISCELLANEOUS) ×3 IMPLANT
NS IRRIG 1000ML POUR BTL (IV SOLUTION) ×12 IMPLANT
OIL SILICONE PENTAX (PARTS (SERVICE/REPAIRS)) ×3 IMPLANT
PACK CHEST (CUSTOM PROCEDURE TRAY) ×3 IMPLANT
PAD ARMBOARD 7.5X6 YLW CONV (MISCELLANEOUS) ×6 IMPLANT
PASSER SUT SWANSON 36MM LOOP (INSTRUMENTS) IMPLANT
SCISSORS LAP 5X35 DISP (ENDOMECHANICALS) IMPLANT
SEALANT PROGEL (MISCELLANEOUS) IMPLANT
SEALANT SURG COSEAL 4ML (VASCULAR PRODUCTS) IMPLANT
SEALANT SURG COSEAL 8ML (VASCULAR PRODUCTS) IMPLANT
SOLUTION ANTI FOG 6CC (MISCELLANEOUS) ×3 IMPLANT
STOPCOCK MORSE 400PSI 3WAY (MISCELLANEOUS) ×3 IMPLANT
SUT PROLENE 3 0 SH DA (SUTURE) IMPLANT
SUT PROLENE 4 0 RB 1 (SUTURE)
SUT PROLENE 4-0 RB1 .5 CRCL 36 (SUTURE) IMPLANT
SUT SILK  1 MH (SUTURE) ×4
SUT SILK 1 MH (SUTURE) ×8 IMPLANT
SUT SILK 1 TIES 10X30 (SUTURE) IMPLANT
SUT SILK 2 0SH CR/8 30 (SUTURE) IMPLANT
SUT VIC AB 1 CTX 18 (SUTURE) IMPLANT
SUT VIC AB 1 CTX 36 (SUTURE)
SUT VIC AB 1 CTX36XBRD ANBCTR (SUTURE) IMPLANT
SUT VIC AB 2-0 CTX 36 (SUTURE) IMPLANT
SUT VIC AB 3-0 X1 27 (SUTURE) IMPLANT
SUT VICRYL 0 UR6 27IN ABS (SUTURE) IMPLANT
SUT VICRYL 2 TP 1 (SUTURE) IMPLANT
SWAB COLLECTION DEVICE MRSA (MISCELLANEOUS) ×3 IMPLANT
SYR 20ML ECCENTRIC (SYRINGE) ×3 IMPLANT
SYR 5ML LL (SYRINGE) ×3 IMPLANT
SYRINGE 10CC LL (SYRINGE) ×3 IMPLANT
SYSTEM SAHARA CHEST DRAIN RE-I (WOUND CARE) ×3 IMPLANT
TAPE CLOTH 4X10 WHT NS (GAUZE/BANDAGES/DRESSINGS) ×3 IMPLANT
TAPE UMBILICAL COTTON 1/8X30 (MISCELLANEOUS) ×3 IMPLANT
TIP APPLICATOR SPRAY EXTEND 16 (VASCULAR PRODUCTS) IMPLANT
TOWEL NATURAL 4PK STERILE (DISPOSABLE) ×3 IMPLANT
TOWEL OR 17X24 6PK STRL BLUE (TOWEL DISPOSABLE) ×6 IMPLANT
TOWEL OR 17X26 10 PK STRL BLUE (TOWEL DISPOSABLE) ×6 IMPLANT
TRAP SPECIMEN MUCOUS 40CC (MISCELLANEOUS) ×3 IMPLANT
TRAY FOLEY CATH SILVER 16FR LF (SET/KITS/TRAYS/PACK) ×3 IMPLANT
TROCAR XCEL BLUNT TIP 100MML (ENDOMECHANICALS) IMPLANT
TUBE ANAEROBIC SPECIMEN COL (MISCELLANEOUS) ×3 IMPLANT
TUBE CONNECTING 20X1/4 (TUBING) ×3 IMPLANT
TUNNELER SHEATH ON-Q 11GX8 DSP (PAIN MANAGEMENT) IMPLANT
VALVE IN CARTRIDGE 7MM HUD (Valve) ×3 IMPLANT
WATER STERILE IRR 1000ML POUR (IV SOLUTION) ×3 IMPLANT

## 2015-04-19 NOTE — Brief Op Note (Signed)
      [address]       [place] [address]             [phone]      04/19/2015  11:52 AM  PATIENT:  Michelle King  54 y.o. female  PRE-OPERATIVE DIAGNOSIS:  right ptx with airleak  POST-OPERATIVE DIAGNOSIS:  right ptx with airleak  PROCEDURE:  Procedure(s): VIDEO BRONCHOSCOPY WITH INSERTION OF INTERBRONCHIAL VALVE (IBV) (Right)  SURGEON:  Surgeon(s) and Role:    * Grace Isaac, MD - Primary   ANESTHESIA:   general  EBL:  Total I/O In: 500 [I.V.:500] Out: 40 [Blood:40]  BLOOD ADMINISTERED:none  DRAINS: chest tube in place preop   LOCAL MEDICATIONS USED:  NONE  SPECIMEN:  No Specimen  DISPOSITION OF SPECIMEN:  N/A  COUNTS:  YES   DICTATION: .Dragon Dictation  PLAN OF CARE: inpatient  PATIENT DISPOSITION:  PACU - hemodynamically stable.   Delay start of Pharmacological VTE agent (>24hrs) due to surgical blood loss or risk of bleeding: no

## 2015-04-19 NOTE — Anesthesia Preprocedure Evaluation (Signed)
Anesthesia Evaluation  Patient identified by MRN, date of birth, ID band Patient awake    Reviewed: Allergy & Precautions, NPO status , Patient's Chart, lab work & pertinent test results  Airway Mallampati: II  TM Distance: >3 FB Neck ROM: Full    Dental  (+) Partial Lower, Edentulous Upper   Pulmonary Current Smoker,    + rhonchi        Cardiovascular  Rhythm:Regular     Neuro/Psych    GI/Hepatic   Endo/Other    Renal/GU      Musculoskeletal   Abdominal   Peds  Hematology   Anesthesia Other Findings   Reproductive/Obstetrics                             Anesthesia Physical Anesthesia Plan  ASA: III  Anesthesia Plan: General   Post-op Pain Management:    Induction: Intravenous  Airway Management Planned: Oral ETT  Additional Equipment:   Intra-op Plan:   Post-operative Plan: Extubation in OR  Informed Consent: I have reviewed the patients History and Physical, chart, labs and discussed the procedure including the risks, benefits and alternatives for the proposed anesthesia with the patient or authorized representative who has indicated his/her understanding and acceptance.   Dental advisory given  Plan Discussed with: CRNA and Anesthesiologist  Anesthesia Plan Comments:         Anesthesia Quick Evaluation

## 2015-04-19 NOTE — Progress Notes (Signed)
ABG was obtained but is not crossing over inter face the results are as follows PH 7.387  PCO2 42.8  PO2 91.3 BICARB  25.1

## 2015-04-19 NOTE — Anesthesia Procedure Notes (Signed)
Procedure Name: Intubation Date/Time: 04/19/2015 10:30 AM Performed by: Ollen Bowl Pre-anesthesia Checklist: Patient identified, Emergency Drugs available, Suction available, Patient being monitored and Timeout performed Patient Re-evaluated:Patient Re-evaluated prior to inductionOxygen Delivery Method: Circle system utilized and Simple face mask Preoxygenation: Pre-oxygenation with 100% oxygen Intubation Type: Combination inhalational/ intravenous induction Ventilation: Mask ventilation without difficulty and Oral airway inserted - appropriate to patient size Laryngoscope Size: Sabra Heck and 2 Grade View: Grade II Tube type: Oral Tube size: 8.5 mm Number of attempts: 1 Airway Equipment and Method: Patient positioned with wedge pillow,  Stylet and LTA kit utilized Placement Confirmation: ETT inserted through vocal cords under direct vision,  positive ETCO2 and breath sounds checked- equal and bilateral Secured at: 19 cm Tube secured with: Tape Dental Injury: Teeth and Oropharynx as per pre-operative assessment  Comments: Per Nate, SRNA

## 2015-04-19 NOTE — Progress Notes (Signed)
Patient states she has already taken her home inhaler for this evening. RT will continue to monitor as needed.

## 2015-04-19 NOTE — Transfer of Care (Signed)
Immediate Anesthesia Transfer of Care Note  Patient: Michelle King  Procedure(s) Performed: Procedure(s): VIDEO BRONCHOSCOPY WITH INSERTION OF INTERBRONCHIAL VALVE (IBV) (Right)  Patient Location: PACU  Anesthesia Type:General  Level of Consciousness: awake, alert , oriented and patient cooperative  Airway & Oxygen Therapy: Patient Spontanous Breathing and Patient connected to nasal cannula oxygen  Post-op Assessment: Report given to RN and Post -op Vital signs reviewed and stable  Post vital signs: Reviewed and stable  Last Vitals:  Filed Vitals:   04/19/15 0800  BP:   Pulse: 95  Temp: 36.7 C  Resp: 21    Complications: No apparent anesthesia complications

## 2015-04-19 NOTE — Anesthesia Postprocedure Evaluation (Signed)
  Anesthesia Post-op Note  Patient: Michelle King  Procedure(s) Performed: Procedure(s): VIDEO BRONCHOSCOPY WITH INSERTION OF INTERBRONCHIAL VALVE (IBV) (Right)  Patient Location: PACU  Anesthesia Type:General  Level of Consciousness: awake, alert  and oriented  Airway and Oxygen Therapy: Patient Spontanous Breathing and Patient connected to nasal cannula oxygen  Post-op Pain: mild  Post-op Assessment: Post-op Vital signs reviewed, Patient's Cardiovascular Status Stable, Respiratory Function Stable, Patent Airway and Pain level controlled              Post-op Vital Signs: stable  Last Vitals:  Filed Vitals:   04/19/15 1239  BP:   Pulse:   Temp: 36.7 C  Resp:     Complications: No apparent anesthesia complications

## 2015-04-20 ENCOUNTER — Encounter (HOSPITAL_COMMUNITY): Payer: Self-pay | Admitting: Cardiothoracic Surgery

## 2015-04-20 ENCOUNTER — Inpatient Hospital Stay (HOSPITAL_COMMUNITY): Payer: Medicaid Other

## 2015-04-20 NOTE — Progress Notes (Addendum)
       [address]       [place] [address]             [phone]          1 Day Post-Op Procedure(s) (LRB): VIDEO BRONCHOSCOPY WITH INSERTION OF INTERBRONCHIAL VALVE (IBV) (Right)  Subjective: Comfortable, breathing stable.   Objective: Vital signs in last 24 hours: Patient Vitals for the past 24 hrs:  BP Temp Temp src Pulse Resp SpO2  04/20/15 0721 (!) 92/57 mmHg - - - 15 99 %  04/20/15 0345 95/63 mmHg - - - 11 95 %  04/20/15 0343 - 98.2 F (36.8 C) Oral - - -  04/19/15 2230 94/64 mmHg - - - 19 99 %  04/19/15 2005 - - - (!) 112 (!) 21 96 %  04/19/15 1958 95/64 mmHg - - - (!) 31 97 %  04/19/15 1955 - 98.4 F (36.9 C) Oral - - -  04/19/15 1530 104/70 mmHg 98.3 F (36.8 C) Oral (!) 124 (!) 23 94 %  04/19/15 1459 - - - - - 96 %  04/19/15 1239 - 98.1 F (36.7 C) Oral - - -  04/19/15 1215 - 98.4 F (36.9 C) - - - -  04/19/15 1212 103/70 mmHg - - (!) 102 20 97 %  04/19/15 1157 106/64 mmHg - - 90 14 96 %  04/19/15 1146 - 98.7 F (37.1 C) - - - -  04/19/15 1142 106/70 mmHg - - 99 13 92 %   Current Weight  04/18/15 92 lb (41.731 kg)     Intake/Output from previous day: 10/04 0701 - 10/05 0700 In: 1580 [P.O.:480; I.V.:1100] Out: 490 [Urine:400; Blood:40; Chest Tube:50]    PHYSICAL EXAM:  Heart: RRR Lungs: Decreased BS on R Wound: Clean and dry Chest tube: No air leak with normal inspiration, but large air leak with cough    Lab Results: CBC: Recent Labs  04/18/15 0400 04/18/15 2225  WBC 5.3 5.8  HGB 8.7* 7.6*  HCT 27.3* 23.9*  PLT 393 334   BMET:  Recent Labs  04/18/15 0400 04/18/15 2225  NA 138 136  K 3.5 4.1  CL 103 102  CO2 27 27  GLUCOSE 93 153*  BUN 9 12  CREATININE 0.49 0.61  CALCIUM 8.9 8.5*    PT/INR:  Recent Labs  04/18/15 2225  LABPROT 14.9  INR 1.15   CXR: FINDINGS: Right chest tube in stable position. Stable right pneumothorax. Postsurgical changes right lung. No focal acute infiltrate.  Heart size stable. No pleural effusion or pneumothorax.  IMPRESSION: 1. Right chest tube in stable position. Stable right pneumothorax. Postsurgical changes right lung. 2. No acute cardiopulmonary disease otherwise noted.   Assessment/Plan: S/P Procedure(s) (LRB): VIDEO BRONCHOSCOPY WITH INSERTION OF INTERBRONCHIAL VALVE (IBV) (Right) CT with small apical ptx. CT with large air leak with cough. Continue CT to suction for now.   LOS: 26 days    [name],[name] 04/20/2015  alked three times around unit  Feels better Back on lovenox and plavix No continues air leak now, air leak with cough after off suction and walking Now on -40 suction with no airleak, will leave on 40 suction for now I have seen and examined Ned Card and agree with the above assessment  and plan.  Grace Isaac MD Beeper [phone] Office [phone] 04/20/2015 1:25 PM

## 2015-04-20 NOTE — Progress Notes (Addendum)
Nutrition Follow-up  DOCUMENTATION CODES:   Underweight  INTERVENTION:    Continue snacks TID to help maximize oral intake of protein and calories.  Husband to bring in food from home or outside of the hospital.  NUTRITION DIAGNOSIS:   Increased nutrient needs related to chronic illness as evidenced by estimated needs.  Ongoing  GOAL:   Patient will meet greater than or equal to 90% of their needs   MONITOR:   PO intake, Supplement acceptance, Labs, Weight trends  ASSESSMENT:   54 yo female admitted with lung CA s/p chemotherapy. S/P VATS with right upper lobectomy and lymph node dissection on 9/9.  S/P colonoscopy on 10/3, S/P video bronchoscopy with insertion of interbronchial valve on 10/4. Weight down 10 lbs since admission, could be partially related to fluid loss. Patient thinks she has been eating better over the past few days since her diet was liberalized to regular. She states she will eat a lot better when she can go home. Appetite is good. Husband has been "sneaking" in food from home. Encouraged intake of high protein, high calorie foods. She has been receiving snacks TID between meals.   Diet Order:  Diet regular Room service appropriate?: Yes; Fluid consistency:: Thin  Skin:  Reviewed, no issues  Last BM:  10/3  Height:   Ht Readings from Last 1 Encounters:  04/18/15 '5\' 2"'$  (1.575 m)    Weight:   Wt Readings from Last 1 Encounters:  04/18/15 92 lb (41.731 kg)   03/29/15 102 lb 1.2 oz (46.3 kg)        Ideal Body Weight:  50 kg  BMI:  Body mass index is 16.82 kg/(m^2). Underweight  Estimated Nutritional Needs:   Kcal:  1550-1750  Protein:  75-85 gm  Fluid:  1.6-1.8 L  EDUCATION NEEDS:   Education needs addressed   Molli Barrows, [place], [place], [place] Pager [phone] After Hours Pager [phone]

## 2015-04-21 ENCOUNTER — Inpatient Hospital Stay (HOSPITAL_COMMUNITY): Payer: Medicaid Other

## 2015-04-21 NOTE — Progress Notes (Signed)
      [address]       [place] [address]             [phone]      2 Days Post-Op Procedure(s) (LRB): VIDEO BRONCHOSCOPY WITH INSERTION OF INTERBRONCHIAL VALVE (IBV) (Right)   Subjective:  Michelle King has no complaints this morning.  She continues to remain hopeful the leak will resolve.  Objective: Vital signs in last 24 hours: Temp:  [97.8 F (36.6 C)-98.6 F (37 C)] 98.3 F (36.8 C) (10/06 0722) Pulse Rate:  [80-88] 86 (10/06 0419) Cardiac Rhythm:  [-] Normal sinus rhythm (10/06 0818) Resp:  [15-25] 23 (10/06 0748) BP: (82-95)/(58-64) 82/59 mmHg (10/06 0748) SpO2:  [94 %-99 %] 97 % (10/06 0748)  Intake/Output from previous day: 10/05 0701 - 10/06 0700 In: 900 [P.O.:800; I.V.:100] Out: 130 [Chest Tube:130] Intake/Output this shift: Total I/O In: 220 [P.O.:220] Out: 20 [Chest Tube:20]  General appearance: alert, cooperative and no distress Heart: regular rate and rhythm Lungs: clear to auscultation bilaterally Abdomen: soft, non-tender; bowel sounds normal; no masses,  no organomegaly Wound: clean and dry  Lab Results:  Recent Labs  04/18/15 2225  WBC 5.8  HGB 7.6*  HCT 23.9*  PLT 334   BMET:  Recent Labs  04/18/15 2225  NA 136  K 4.1  CL 102  CO2 27  GLUCOSE 153*  BUN 12  CREATININE 0.61  CALCIUM 8.5*    PT/INR:  Recent Labs  04/18/15 2225  LABPROT 14.9  INR 1.15   ABG    Component Value Date/Time   PHART 7.387 04/18/2015 2155   HCO3 25.1* 04/18/2015 2155   TCO2 26.4 04/18/2015 2155   ACIDBASEDEF 0.2 03/23/2015 1353   O2SAT 97.1 04/18/2015 2155   CBG (last 3)  No results for input(s): GLUCAP in the last 72 hours.  Assessment/Plan: S/P Procedure(s) (LRB): VIDEO BRONCHOSCOPY WITH INSERTION OF INTERBRONCHIAL VALVE (IBV) (Right)  1. CV- hemodynamically stable 2. Pulm- improvement of pneumothorax on CXR, Chest tube continues to have air leak, somewhat improved- will leave chest tube on -40 cm suction  today 3. GI- no further blood in stools, on ASA and Plavix,  4. Dispo- patient remains stable, continue current care   LOS: 27 days    Monik Lins 04/21/2015

## 2015-04-22 ENCOUNTER — Inpatient Hospital Stay (HOSPITAL_COMMUNITY): Payer: Medicaid Other

## 2015-04-22 NOTE — Progress Notes (Addendum)
       [address]       [place] [address]             [phone]          3 Days Post-Op Procedure(s) (LRB): VIDEO BRONCHOSCOPY WITH INSERTION OF INTERBRONCHIAL VALVE (IBV) (Right)  Subjective: No new complaints. Frustrated with lack of progress.   Objective: Vital signs in last 24 hours: Patient Vitals for the past 24 hrs:  BP Temp Temp src Resp SpO2  04/22/15 0747 (!) 85/53 mmHg 98.6 F (37 C) Oral (!) 30 95 %  04/22/15 0723 - - - - 95 %  04/22/15 0425 92/67 mmHg 98.2 F (36.8 C) Oral 19 95 %  04/21/15 2330 116/65 mmHg 98.3 F (36.8 C) Oral (!) 28 100 %  04/21/15 2100 - 98.2 F (36.8 C) Oral - -  04/21/15 2001 - - - - 97 %  04/21/15 1954 104/67 mmHg - - (!) 22 100 %  04/21/15 1552 (!) 91/53 mmHg 98.3 F (36.8 C) Oral (!) 21 96 %  04/21/15 1521 - - - - 98 %  04/21/15 1139 97/64 mmHg - - 19 97 %  04/21/15 1126 - 98.1 F (36.7 C) Oral - 94 %   Current Weight  04/18/15 92 lb (41.731 kg)     Intake/Output from previous day: 10/06 0701 - 10/07 0700 In: 660 [P.O.:660] Out: 90 [Chest Tube:90]    PHYSICAL EXAM:  Heart: RRR Lungs: Clear Wound: Clean and dry Chest tube: intermittent 1/7 air leak with cough    Lab Results: CBC:No results for input(s): WBC, HGB, HCT, PLT in the last 72 hours. BMET: No results for input(s): NA, K, CL, CO2, GLUCOSE, BUN, CREATININE, CALCIUM in the last 72 hours.  PT/INR: No results for input(s): LABPROT, INR in the last 72 hours.  CXR; FINDINGS: Residual trace pneumothorax at the right apex shows further decrease in size since the prior study. There is stable positioning of a single lateral right chest tube. Multiple right-sided bronchial valves shows stable positioning by chest x-ray. There is no evidence of pulmonary edema or pleural effusion. The heart size and mediastinal contours are normal.  IMPRESSION: Further reduction in size of the residual right apical pneumothorax with only a tiny trace  pneumothorax remaining.   Assessment/Plan: S/P Procedure(s) (LRB): VIDEO BRONCHOSCOPY WITH INSERTION OF INTERBRONCHIAL VALVE (IBV) (Right) CXR with near resolution of ptx,, CT with only tiny intermittent air leak with cough.  Possibly can start to decrease suction today.    LOS: 28 days    [name],[name] 04/22/2015  Only trace of ptx, on 40 mm suction Will decrease suction to 2030 today poss 20 tomorrow Small intermittent air leak now I have seen and examined Ned Card and agree with the above assessment  and plan.  Grace Isaac MD Beeper [phone] Office [phone] 04/22/2015 4:45 PM

## 2015-04-23 ENCOUNTER — Inpatient Hospital Stay (HOSPITAL_COMMUNITY): Payer: Medicaid Other

## 2015-04-23 NOTE — Progress Notes (Addendum)
[address]       [place] [address]             [phone]      4 Days Post-Op Procedure(s) (LRB): VIDEO BRONCHOSCOPY WITH INSERTION OF INTERBRONCHIAL VALVE (IBV) (Right) Subjective: conts with air leak  Objective: Vital signs in last 24 hours: Temp:  [98 F (36.7 C)-98.7 F (37.1 C)] 98.3 F (36.8 C) (10/08 0700) Pulse Rate:  [81-86] 86 (10/08 0419) Cardiac Rhythm:  [-] Normal sinus rhythm (10/08 0805) Resp:  [14-29] 15 (10/08 0419) BP: (99-105)/(62-67) 100/66 mmHg (10/08 0419) SpO2:  [92 %-98 %] 94 % (10/08 0913)  Hemodynamic parameters for last 24 hours:    Intake/Output from previous day: 10/07 0701 - 10/08 0700 In: 1280 [P.O.:1280] Out: 91 [Chest Tube:60] Intake/Output this shift: Total I/O In: 240 [P.O.:240] Out: 0   General appearance: alert, cooperative and no distress Heart: regular rate and rhythm Lungs: coarse and dim on right  Lab Results: No results for input(s): WBC, HGB, HCT, PLT in the last 72 hours. BMET: No results for input(s): NA, K, CL, CO2, GLUCOSE, BUN, CREATININE, CALCIUM in the last 72 hours.  PT/INR: No results for input(s): LABPROT, INR in the last 72 hours. ABG    Component Value Date/Time   PHART 7.387 04/18/2015 2155   HCO3 25.1* 04/18/2015 2155   TCO2 26.4 04/18/2015 2155   ACIDBASEDEF 0.2 03/23/2015 1353   O2SAT 97.1 04/18/2015 2155   CBG (last 3)  No results for input(s): GLUCAP in the last 72 hours.  Meds Scheduled Meds: . aspirin EC  81 mg Oral Daily  . bisacodyl  10 mg Oral Daily  . budesonide  0.5 mg Nebulization BID  . clopidogrel  75 mg Oral Daily  . enoxaparin (LOVENOX) injection  30 mg Subcutaneous Q24H  . Fluticasone Furoate-Vilanterol  1 puff Inhalation Daily  . folic acid  1 mg Oral Daily  . guaiFENesin  600 mg Oral BID  . ipratropium  0.5 mg Nebulization QID  . pantoprazole  40 mg Oral Daily  . senna-docusate  1 tablet Oral QHS   Continuous Infusions:  PRN Meds:.albuterol,  ALPRAZolam, diphenhydrAMINE, fentaNYL (SUBLIMAZE) injection, HYDROcodone-acetaminophen, potassium chloride, traMADol  Xrays Dg Chest Port 1 View  04/23/2015   CLINICAL DATA:  Pneumothorax  EXAM: PORTABLE CHEST 1 VIEW  COMPARISON:  Chest radiograph from one day prior.  FINDINGS: Right apical chest tube is stable in position. Small 5-10% right apical pneumothorax is increased in size. No left pneumothorax. Stable cardiomediastinal silhouette with normal heart size. No focal lung opacity. Stable surgical sutures and devices overlying the right hilum.  IMPRESSION: Small right apical pneumothorax, increased.  These results were called by telephone at the time of interpretation on 04/23/2015 at 7:36 am to [place], who verbally acknowledged these results.   Electronically Signed   By: Ilona Sorrel M.D.   On: 04/23/2015 07:39   Dg Chest Port 1 View  04/22/2015   CLINICAL DATA:  Air leak status post right upper lobectomy and status post placement of bronchial valves.  EXAM: PORTABLE CHEST 1 VIEW  COMPARISON:  04/21/2015  FINDINGS: Residual trace pneumothorax at the right apex shows further decrease in size since the prior study. There is stable positioning of a single lateral right chest tube. Multiple right-sided bronchial valves shows stable positioning by chest x-ray. There is no evidence of pulmonary edema or pleural effusion. The heart size and mediastinal contours are normal.  IMPRESSION: Further reduction in size of the residual right apical pneumothorax with only a tiny trace pneumothorax remaining.   Electronically Signed   By: Aletta Edouard M.D.   On: 04/22/2015 08:13    Assessment/Plan: S/P Procedure(s) (LRB): VIDEO BRONCHOSCOPY WITH INSERTION OF INTERBRONCHIAL VALVE (IBV) (Right)  1 small right pntx persists, cont current level of suction    LOS: 29 days    GOLD,WAYNE E 04/23/2015   Chart reviewed, patient examined, agree with above. The right apical ptx is small but larger than  yesterday. Keep suction where it is.

## 2015-04-24 NOTE — Progress Notes (Addendum)
      [address]       [place] [address]             [phone]      5 Days Post-Op Procedure(s) (LRB): VIDEO BRONCHOSCOPY WITH INSERTION OF INTERBRONCHIAL VALVE (IBV) (Right) Subjective: Feels about the same  Objective: Vital signs in last 24 hours: Temp:  [97.8 F (36.6 C)-98.3 F (36.8 C)] 98.1 F (36.7 C) (10/09 0821) Pulse Rate:  [87-103] 87 (10/09 0359) Cardiac Rhythm:  [-] Normal sinus rhythm (10/09 0825) Resp:  [13-23] 21 (10/09 0821) BP: (90-104)/(58-67) 99/62 mmHg (10/09 0821) SpO2:  [96 %-99 %] 97 % (10/09 0844)  Hemodynamic parameters for last 24 hours:    Intake/Output from previous day: 10/08 0701 - 10/09 0700 In: 720 [P.O.:720] Out: 22 [Urine:2; Chest Tube:20] Intake/Output this shift: Total I/O In: 240 [P.O.:240] Out: 0   General appearance: alert, cooperative and no distress Heart: regular rate and rhythm Lungs: coarse on right  Lab Results: No results for input(s): WBC, HGB, HCT, PLT in the last 72 hours. BMET: No results for input(s): NA, K, CL, CO2, GLUCOSE, BUN, CREATININE, CALCIUM in the last 72 hours.  PT/INR: No results for input(s): LABPROT, INR in the last 72 hours. ABG    Component Value Date/Time   PHART 7.387 04/18/2015 2155   HCO3 25.1* 04/18/2015 2155   TCO2 26.4 04/18/2015 2155   ACIDBASEDEF 0.2 03/23/2015 1353   O2SAT 97.1 04/18/2015 2155   CBG (last 3)  No results for input(s): GLUCAP in the last 72 hours.  Meds Scheduled Meds: . aspirin EC  81 mg Oral Daily  . bisacodyl  10 mg Oral Daily  . budesonide  0.5 mg Nebulization BID  . clopidogrel  75 mg Oral Daily  . enoxaparin (LOVENOX) injection  30 mg Subcutaneous Q24H  . Fluticasone Furoate-Vilanterol  1 puff Inhalation Daily  . folic acid  1 mg Oral Daily  . guaiFENesin  600 mg Oral BID  . ipratropium  0.5 mg Nebulization QID  . pantoprazole  40 mg Oral Daily  . senna-docusate  1 tablet Oral QHS   Continuous Infusions:  PRN Meds:.albuterol,  ALPRAZolam, diphenhydrAMINE, fentaNYL (SUBLIMAZE) injection, HYDROcodone-acetaminophen, potassium chloride, traMADol  Xrays Dg Chest Port 1 View  04/23/2015   CLINICAL DATA:  Pneumothorax  EXAM: PORTABLE CHEST 1 VIEW  COMPARISON:  Chest radiograph from one day prior.  FINDINGS: Right apical chest tube is stable in position. Small 5-10% right apical pneumothorax is increased in size. No left pneumothorax. Stable cardiomediastinal silhouette with normal heart size. No focal lung opacity. Stable surgical sutures and devices overlying the right hilum.  IMPRESSION: Small right apical pneumothorax, increased.  These results were called by telephone at the time of interpretation on 04/23/2015 at 7:36 am to [place], who verbally acknowledged these results.   Electronically Signed   By: Ilona Sorrel M.D.   On: 04/23/2015 07:39    Assessment/Plan: S/P Procedure(s) (LRB): VIDEO BRONCHOSCOPY WITH INSERTION OF INTERBRONCHIAL VALVE (IBV) (Right)  1 stable,small air leak persists no CXR today , will order for tomorrow. No changes for now   LOS: 30 days    GOLD,WAYNE E 04/24/2015   Chart reviewed, patient examined, agree with above. Small but persistent air leak. Continue suction at 30 today. CXR in the am. Discussed status with patient and her husband

## 2015-04-25 ENCOUNTER — Inpatient Hospital Stay (HOSPITAL_COMMUNITY): Payer: Medicaid Other

## 2015-04-25 NOTE — Progress Notes (Addendum)
      [address]       [place] [address]             [phone]      6 Days Post-Op Procedure(s) (LRB): VIDEO BRONCHOSCOPY WITH INSERTION OF INTERBRONCHIAL VALVE (IBV) (Right) Subjective: Didn't sleep well  Objective: Vital signs in last 24 hours: Temp:  [98.1 F (36.7 C)-98.4 F (36.9 C)] 98.2 F (36.8 C) (10/10 0743) Pulse Rate:  [74-98] 74 (10/10 0341) Cardiac Rhythm:  [-] Normal sinus rhythm (10/10 0341) Resp:  [14-29] 14 (10/10 0341) BP: (86-106)/(56-64) 106/64 mmHg (10/10 0341) SpO2:  [98 %-100 %] 100 % (10/10 0819)  Hemodynamic parameters for last 24 hours:    Intake/Output from previous day: 10/09 0701 - 10/10 0700 In: 720 [P.O.:720] Out: 20 [Chest Tube:20] Intake/Output this shift:    General appearance: alert, cooperative, fatigued and no distress Heart: regular rate and rhythm Lungs: faint wheeze  Lab Results: No results for input(s): WBC, HGB, HCT, PLT in the last 72 hours. BMET: No results for input(s): NA, K, CL, CO2, GLUCOSE, BUN, CREATININE, CALCIUM in the last 72 hours.  PT/INR: No results for input(s): LABPROT, INR in the last 72 hours. ABG    Component Value Date/Time   PHART 7.387 04/18/2015 2155   HCO3 25.1* 04/18/2015 2155   TCO2 26.4 04/18/2015 2155   ACIDBASEDEF 0.2 03/23/2015 1353   O2SAT 97.1 04/18/2015 2155   CBG (last 3)  No results for input(s): GLUCAP in the last 72 hours.  Meds Scheduled Meds: . aspirin EC  81 mg Oral Daily  . bisacodyl  10 mg Oral Daily  . budesonide  0.5 mg Nebulization BID  . clopidogrel  75 mg Oral Daily  . enoxaparin (LOVENOX) injection  30 mg Subcutaneous Q24H  . Fluticasone Furoate-Vilanterol  1 puff Inhalation Daily  . folic acid  1 mg Oral Daily  . guaiFENesin  600 mg Oral BID  . ipratropium  0.5 mg Nebulization QID  . pantoprazole  40 mg Oral Daily  . senna-docusate  1 tablet Oral QHS   Continuous Infusions:  PRN Meds:.albuterol, ALPRAZolam, diphenhydrAMINE, fentaNYL  (SUBLIMAZE) injection, HYDROcodone-acetaminophen, potassium chloride, traMADol  Xrays Dg Chest Port 1 View  04/25/2015   CLINICAL DATA:  Pneumothorax and chest tube  EXAM: PORTABLE CHEST 1 VIEW  COMPARISON:  04/23/2015  FINDINGS: Stable small right apical pneumothorax, with 2 pleural interfaces seen. A right apical chest tube is in stable position. No collapse or consolidation. Stable postsurgical change to the right hilum, including inter bronchial valves. Normal heart size and aortic contours.  IMPRESSION: 1. Small right apical pneumothorax is unchanged compared to 2 days ago. 2. Right interbronchial valves without atelectasis.   Electronically Signed   By: Monte Fantasia M.D.   On: 04/25/2015 07:28    Assessment/Plan: S/P Procedure(s) (LRB): VIDEO BRONCHOSCOPY WITH INSERTION OF INTERBRONCHIAL VALVE (IBV) (Right)  1 conts with air leak, decrease suction to 20 cm per EBG    LOS: 31 days    Michelle King,Michelle King 04/25/2015  Follow up chest xray with small air lek now. I have seen and examined Michelle King and agree with the above assessment  and plan.  Grace Isaac MD Beeper [phone] Office [phone] 04/25/2015 7:43 PM

## 2015-04-25 NOTE — Progress Notes (Signed)
Pt nervous about CT being loose d/t suture breakdown. Please address.

## 2015-04-26 ENCOUNTER — Inpatient Hospital Stay (HOSPITAL_COMMUNITY): Payer: Medicaid Other

## 2015-04-26 LAB — MRSA PCR SCREENING: MRSA by PCR: NEGATIVE

## 2015-04-26 MED ORDER — CEPHALEXIN 500 MG PO CAPS
500.0000 mg | ORAL_CAPSULE | Freq: Three times a day (TID) | ORAL | Status: DC
  Administered 2015-04-26 (×2): 500 mg via ORAL
  Filled 2015-04-26 (×5): qty 1

## 2015-04-26 NOTE — Progress Notes (Addendum)
Nutrition Follow-up  DOCUMENTATION CODES:   Underweight  INTERVENTION:    Continue snacks TID to help maximize oral intake of protein and calories.  Husband to bring in food from home or outside of the hospital.  NUTRITION DIAGNOSIS:   Increased nutrient needs related to chronic illness as evidenced by estimated needs.  Ongoing  GOAL:   Patient will meet greater than or equal to 90% of their needs  Progressing  MONITOR:   PO intake, Supplement acceptance, Labs, Weight trends  REASON FOR ASSESSMENT:   Malnutrition Screening Tool    ASSESSMENT:   54 yo female admitted with lung CA s/p chemotherapy. S/P VATS with right upper lobectomy and lymph node dissection on 9/9.  S/P colonoscopy on 10/3, S/P video bronchoscopy with insertion of interbronchial valve on 10/4.   Per MD notes, CXR has not been completed yet this AM. Pt remains with chest tube to suction. Air leak persists.   Pt sitting in recliner, watching TV in time of visit. She is very very good spirits this AM and shares with the RD that she is "determined to get out of the hospital". She reveals that her appetite is improving; consumed 100% of her eggs at breakfast. Meal completion averaging 50%. She confirms that she is receiving pudding between meals. Noted multiple snacks at bedside (goldfish crackers and fun sized [employer] and [place] Peppermint Patties). She reports "I even ate a Big Mac yesterday". Reinforced importance of good intake of meals and snacks, especially protein rich foods, to support healing.   Pt denied any additional nutritional needs, but expressed appreciation for visit.   Labs reviewed.   Diet Order:  Diet regular Room service appropriate?: Yes; Fluid consistency:: Thin  Skin:  Reviewed, no issues  Last BM:  04/25/15  Height:   Ht Readings from Last 1 Encounters:  04/18/15 '5\' 2"'$  (1.575 m)    Weight:   Wt Readings from Last 1 Encounters:  04/18/15 92 lb (41.731 kg)     Ideal Body Weight:  50 kg  BMI:  Body mass index is 16.82 kg/(m^2).  Estimated Nutritional Needs:   Kcal:  1550-1750  Protein:  75-85 gm  Fluid:  1.6-1.8 L  EDUCATION NEEDS:   Education needs addressed  Seleta Hovland A. Jimmye Norman, RD, LDN, CDE Pager: [phone] After hours Pager: [phone]

## 2015-04-26 NOTE — Progress Notes (Addendum)
       [address]       [place] [address]             [phone]          7 Days Post-Op Procedure(s) (LRB): VIDEO BRONCHOSCOPY WITH INSERTION OF INTERBRONCHIAL VALVE (IBV) (Right)  Subjective: C/o soreness around CT site.  Feels like tube is "pulling".  Breathing stable.   Objective: Vital signs in last 24 hours: Patient Vitals for the past 24 hrs:  BP Temp Temp src Resp SpO2  04/26/15 0433 102/63 mmHg - - (!) 28 99 %  04/26/15 0432 - 98.1 F (36.7 C) Oral - -  04/25/15 2321 98/65 mmHg - - 17 98 %  04/25/15 2319 - 98.4 F (36.9 C) Oral - -  04/25/15 2013 - - - - 100 %  04/25/15 2000 97/71 mmHg - - 19 99 %  04/25/15 1954 97/71 mmHg 98.2 F (36.8 C) Oral 20 99 %  04/25/15 1540 - 98.3 F (36.8 C) Oral - 99 %  04/25/15 1525 131/80 mmHg - - 10 100 %  04/25/15 1151 131/80 mmHg 97.8 F (36.6 C) Oral - 99 %   Current Weight  04/18/15 92 lb (41.731 kg)     Intake/Output from previous day: 10/10 0701 - 10/11 0700 In: 45 [P.O.:960] Out: -     PHYSICAL EXAM:  Heart: RRR, mildly tachy 100-110s Lungs: Clear Wound: Incisions clean and dry. CT appears to be in place, no erythema around sites.  Chest tube: Minimal air leak at rest, + 3-4/7 leak with cough    Lab Results: CBC:No results for input(s): WBC, HGB, HCT, PLT in the last 72 hours. BMET: No results for input(s): NA, K, CL, CO2, GLUCOSE, BUN, CREATININE, CALCIUM in the last 72 hours.  PT/INR: No results for input(s): LABPROT, INR in the last 72 hours.    Assessment/Plan: S/P Procedure(s) (LRB): VIDEO BRONCHOSCOPY WITH INSERTION OF INTERBRONCHIAL VALVE (IBV) (Right) CXR not done yet this am, air leak persists. Ct    LOS: 32 days    [name],[name] 04/26/2015  ADDENDUM: Chest x-ray -  COMPARISON: 04/25/2015.  FINDINGS: Right chest tube in stable position. Tiny right apical pneumothorax has almost completely resolved. Postsurgical changes right lung. Left lung is clear.  Heart size stable.  IMPRESSION: 1. Right chest tube in stable position. Near complete interim resolution of right pneumothorax with tiny residual. 2. Postsurgical changes right lung.  Ct suction decreased to -10 , decreased air lek, now with cough only  I have seen and examined Michelle King and agree with the above assessment  and plan.  Grace Isaac MD Beeper [phone] Office [phone] 04/26/2015 6:26 PM

## 2015-04-26 NOTE — Progress Notes (Addendum)
UR COMPLETED  

## 2015-04-26 NOTE — Progress Notes (Signed)
Patient having increased pain at chest tube site. Chest tube insertion site erythematous, tender, firm, scant purulent drainage with chest tube sutures migrating back and forth under irritated skin while patient breathes/moves. Changed dressing, pain meds given. Jadene Pierini, [place] notified of findings. New orders received. Will continue to monitor.

## 2015-04-27 ENCOUNTER — Inpatient Hospital Stay (HOSPITAL_COMMUNITY): Payer: Medicaid Other

## 2015-04-27 NOTE — Progress Notes (Addendum)
      [address]       [place] [address]             [phone]       8 Days Post-Op Procedure(s) (LRB): VIDEO BRONCHOSCOPY WITH INSERTION OF INTERBRONCHIAL VALVE (IBV) (Right)  Subjective: Patient has tenderness at chest tube insertion site.  Objective: Vital signs in last 24 hours: Temp:  [98.3 F (36.8 C)-98.9 F (37.2 C)] 98.4 F (36.9 C) (10/12 0332) Pulse Rate:  [110-119] 119 (10/11 1949) Cardiac Rhythm:  [-] Sinus tachycardia (10/11 2000) Resp:  [14-27] 14 (10/12 0812) BP: (96-111)/(60-73) 96/63 mmHg (10/12 0812) SpO2:  [97 %-100 %] 100 % (10/12 0812)     Intake/Output from previous day: 10/11 0701 - 10/12 0700 In: 480 [P.O.:480] Out: 60 [Chest Tube:60]   Physical Exam:  Cardiovascular:RRR Pulmonary: Clear to auscultation bilaterally; no rales, wheezes, or rhonchi. Abdomen: Soft, non tender, bowel sounds present. Extremities: No lower extremity edema. Wounds: Clean and dry.  There is erythema surround right chest tube insertion site. Chest Tube: to 10 cm suction and there is still an air leak  Lab Results: CBC:No results for input(s): WBC, HGB, HCT, PLT in the last 72 hours. BMET: No results for input(s): NA, K, CL, CO2, GLUCOSE, BUN, CREATININE, CALCIUM in the last 72 hours.  PT/INR: No results for input(s): LABPROT, INR in the last 72 hours. ABG:  INR: Will add last result for INR, ABG once components are confirmed Will add last 4 CBG results once components are confirmed  Assessment/Plan:  1. CV - SR 2.  Pulmonary - On room air. Chest tube is to suction and there is an air leak. CXR this am shows persistent, small right apical pneumothorax. Continue present management for now. 3. ID-on Keflex for erythema at chest tube insertion site.  Michelle King,Michelle King 04/27/2015,8:19 AM  Chest tube site does not appear infected will stop keflex Air leak with cough  Ct to water seal I have seen and examined Michelle King and  agree with the above assessment  and plan.  Grace Isaac MD Beeper [phone] Office [phone] 04/27/2015 3:08 PM

## 2015-04-28 ENCOUNTER — Inpatient Hospital Stay (HOSPITAL_COMMUNITY): Payer: Medicaid Other

## 2015-04-28 NOTE — Progress Notes (Addendum)
      [address]       [place] [address]             [phone]       9 Days Post-Op Procedure(s) (LRB): VIDEO BRONCHOSCOPY WITH INSERTION OF INTERBRONCHIAL VALVE (IBV) (Right)  Subjective: Patient very emotional as discussed chest x ray results.  Objective: Vital signs in last 24 hours: Temp:  [98.4 F (36.9 C)-99.2 F (37.3 C)] 98.8 F (37.1 C) (10/13 0401) Pulse Rate:  [93-118] 93 (10/13 0401) Cardiac Rhythm:  [-] Normal sinus rhythm (10/13 0401) Resp:  [13-27] 14 (10/13 0401) BP: (87-105)/(48-83) 95/48 mmHg (10/13 0401) SpO2:  [94 %-100 %] 97 % (10/13 0401)     Intake/Output from previous day: 10/12 0701 - 10/13 0700 In: 1080 [P.O.:1080] Out: 30 [Chest Tube:30]   Physical Exam:  Cardiovascular:RRR Pulmonary: Clear to auscultation on left and diminished right apex; no rales, wheezes, or rhonchi. Abdomen: Soft, non tender, bowel sounds present. Extremities: No lower extremity edema. Wounds: Clean and dry.  There is erythema surround right chest tube insertion site. Chest Tube: to water seal and there is still an air leak  Lab Results: CBC:No results for input(s): WBC, HGB, HCT, PLT in the last 72 hours. BMET: No results for input(s): NA, K, CL, CO2, GLUCOSE, BUN, CREATININE, CALCIUM in the last 72 hours.  PT/INR: No results for input(s): LABPROT, INR in the last 72 hours. ABG:  INR: Will add last result for INR, ABG once components are confirmed Will add last 4 CBG results once components are confirmed  Assessment/Plan:  1. CV - SR 2.  Pulmonary - On room air. Chest tube is to water seal and there remains a persistent air leak. CXR this am shows right pneumothorax increased (about 40%). Will place chest back to 20 cm of suction as discussed with Dr. Servando Snare. Check CXR in am.   ZIMMERMAN,DONIELLE MPA-C 04/28/2015,8:12 AM   ptx increased today off suction, back on suction air leak back to +1 will decrease back to 10 D/c lovenox due  to recurrent nose bleed  I have seen and examined Ned Card and agree with the above assessment  and plan.  Grace Isaac MD Beeper [phone] Office [phone] 04/28/2015 10:59 AM

## 2015-04-28 NOTE — Progress Notes (Signed)
Upon assessment pt stated already taken her BREO medication.

## 2015-04-29 ENCOUNTER — Inpatient Hospital Stay (HOSPITAL_COMMUNITY): Payer: Medicaid Other

## 2015-04-29 LAB — WOUND CULTURE: SPECIAL REQUESTS: NORMAL

## 2015-04-29 NOTE — Progress Notes (Addendum)
[address]       ,[place] [address]             [phone]      10 Days Post-Op Procedure(s) (LRB): VIDEO BRONCHOSCOPY WITH INSERTION OF INTERBRONCHIAL VALVE (IBV) (Right) Subjective: C/o pain from chest tube  Objective: Vital signs in last 24 hours: Temp:  [97.8 F (36.6 C)-98.6 F (37 C)] 98.1 F (36.7 C) (10/14 0700) Pulse Rate:  [86-112] 86 (10/14 0442) Cardiac Rhythm:  [-] Normal sinus rhythm (10/13 2334) Resp:  [10-27] 14 (10/14 0442) BP: (90-104)/(53-67) 101/53 mmHg (10/14 0442) SpO2:  [95 %-100 %] 96 % (10/14 0442)  Hemodynamic parameters for last 24 hours:    Intake/Output from previous day: 10/13 0701 - 10/14 0700 In: 720 [P.O.:720] Out: 0  Intake/Output this shift:    General appearance: alert, cooperative and no distress Heart: regular rate and rhythm Lungs: dim air exchange on right  Lab Results: No results for input(s): WBC, HGB, HCT, PLT in the last 72 hours. BMET: No results for input(s): NA, K, CL, CO2, GLUCOSE, BUN, CREATININE, CALCIUM in the last 72 hours.  PT/INR: No results for input(s): LABPROT, INR in the last 72 hours. ABG    Component Value Date/Time   PHART 7.387 04/18/2015 2155   HCO3 25.1* 04/18/2015 2155   TCO2 26.4 04/18/2015 2155   ACIDBASEDEF 0.2 03/23/2015 1353   O2SAT 97.1 04/18/2015 2155   CBG (last 3)  No results for input(s): GLUCAP in the last 72 hours.  Meds Scheduled Meds: . aspirin EC  81 mg Oral Daily  . bisacodyl  10 mg Oral Daily  . budesonide  0.5 mg Nebulization BID  . clopidogrel  75 mg Oral Daily  . Fluticasone Furoate-Vilanterol  1 puff Inhalation Daily  . folic acid  1 mg Oral Daily  . guaiFENesin  600 mg Oral BID  . ipratropium  0.5 mg Nebulization QID  . pantoprazole  40 mg Oral Daily  . senna-docusate  1 tablet Oral QHS   Continuous Infusions:  PRN Meds:.albuterol, ALPRAZolam, diphenhydrAMINE, fentaNYL (SUBLIMAZE) injection, HYDROcodone-acetaminophen, potassium chloride,  traMADol  Xrays Dg Chest 2 View  04/28/2015  CLINICAL DATA:  Right chest tube.  History of lung cancer EXAM: CHEST  2 VIEW COMPARISON:  Yesterday FINDINGS: Interval increase in right pneumothorax, approximately 40% now. No mediastinal shift. The right diaphragm is obscured by a small effusion, with no evidence of depression. Right apical chest tube is in stable position. Endobronchial valves show no interval displacement. The left lung remains well aerated. Normal heart size and aortic contours. These results were called by telephone at the time of interpretation on 04/28/2015 at 8:03 am to [facility], who verbally acknowledged these results. IMPRESSION: 1. Enlarged right pneumothorax, now estimated at 40%. 2. Small right pleural effusion. 3. No interval displacement of interbronchial valves. Electronically Signed   By: Monte Fantasia M.D.   On: 04/28/2015 08:08   Dg Chest Port 1 View  04/29/2015  CLINICAL DATA:  Pneumothorax. EXAM: PORTABLE CHEST 1 VIEW COMPARISON:  04/28/2015. FINDINGS: Right chest tube in stable position. Interim partial resolution of right pneumothorax. Mild right base atelectasis. Postsurgical changes right hilum. Small right pleural effusion. Left lung is clear. Heart size is stable . IMPRESSION: 1. Right chest tube in stable position. Interim partial resolution of right pneumothorax with apical pneumothorax remaining. 2. Postsurgical changes of right lung with mild right base atelectasis and small right pleural effusion. Electronically Signed  By: [place]   On: 04/29/2015 07:49   Assessment/Plan: S/P Procedure(s) (LRB): VIDEO BRONCHOSCOPY WITH INSERTION OF INTERBRONCHIAL VALVE (IBV) (Right)  1 pntx is a little better- keep suction where it is currently    LOS: 35 days    King,Michelle E 04/29/2015  Chest xray improved, after 1-2 coughs air leak decrease to very minium. Will try back on water seal i tomorrow and if tolerates poss home Monday I have seen and  examined Michelle King and agree with the above assessment  and plan.  Michelle Isaac MD Beeper [phone] Office [phone] 04/29/2015 10:15 AM

## 2015-04-30 ENCOUNTER — Inpatient Hospital Stay (HOSPITAL_COMMUNITY): Payer: Medicaid Other

## 2015-04-30 NOTE — Progress Notes (Addendum)
       [address]       [place] [address]             [phone]          11 Days Post-Op Procedure(s) (LRB): VIDEO BRONCHOSCOPY WITH INSERTION OF INTERBRONCHIAL VALVE (IBV) (Right)  Subjective: Some discomfort at CT site. Otherwise stable.   Objective: Vital signs in last 24 hours: Patient Vitals for the past 24 hrs:  BP Temp Temp src Pulse Resp SpO2  04/30/15 0700 - 98.1 F (36.7 C) Oral - - -  04/30/15 0556 - - - - (!) 25 98 %  04/30/15 0452 102/66 mmHg 98 F (36.7 C) Oral 94 15 95 %  04/29/15 2346 (!) 92/54 mmHg 98.3 F (36.8 C) Oral 83 16 93 %  04/29/15 2115 - - - - 15 99 %  04/29/15 2037 - - - - - 99 %  04/29/15 2030 - - - - 13 99 %  04/29/15 1942 106/77 mmHg 97.7 F (36.5 C) Oral 95 14 99 %  04/29/15 1720 99/73 mmHg - - (!) 103 - 95 %  04/29/15 1613 - 98.2 F (36.8 C) Oral - - -  04/29/15 1538 (!) 88/61 mmHg - - - (!) 25 99 %  04/29/15 1537 - - - - - 96 %  04/29/15 1158 - - - - - 97 %  04/29/15 1124 98/71 mmHg 98.1 F (36.7 C) Oral 94 20 96 %   Current Weight  04/18/15 92 lb (41.731 kg)     Intake/Output from previous day: 10/14 0701 - 10/15 0700 In: 1320 [P.O.:1320] Out: 0     PHYSICAL EXAM:  Heart: RRR Lungs: Clear Wound: Clean and dry Chest tube: Definite tidaling in chamber, only intermittent 1/7 leak with forceful cough     Lab Results: CBC:No results for input(s): WBC, HGB, HCT, PLT in the last 72 hours. BMET: No results for input(s): NA, K, CL, CO2, GLUCOSE, BUN, CREATININE, CALCIUM in the last 72 hours.  PT/INR: No results for input(s): LABPROT, INR in the last 72 hours.    Assessment/Plan: S/P Procedure(s) (LRB): VIDEO BRONCHOSCOPY WITH INSERTION OF INTERBRONCHIAL VALVE (IBV) (Right) CXR not done yet this am. Will follow up CXR.  Air leak appears to be improving slowly. Continue CT to -10 suction for now. May be able to decrease to water seal if CXR remains stable.   LOS: 36 days    [name],GINA  H 04/30/2015  Patient seen and examined, agree with above Lung well expanded on CXR Will place back to water seal  [place] C. Roxan Hockey, MD [facility] [phone]

## 2015-04-30 NOTE — Progress Notes (Signed)
UR COMPLETED  

## 2015-05-01 ENCOUNTER — Inpatient Hospital Stay (HOSPITAL_COMMUNITY): Payer: Medicaid Other

## 2015-05-01 NOTE — Progress Notes (Addendum)
       [address]       [place] [address]             [phone]          12 Days Post-Op Procedure(s) (LRB): VIDEO BRONCHOSCOPY WITH INSERTION OF INTERBRONCHIAL VALVE (IBV) (Right)  Subjective: Comfortable, no pain or SOB.   Objective: Vital signs in last 24 hours: Patient Vitals for the past 24 hrs:  BP Temp Temp src Pulse Resp SpO2  05/01/15 0743 - - - - - 94 %  05/01/15 0418 (!) 92/59 mmHg 98.1 F (36.7 C) Oral 81 12 96 %  05/01/15 0000 - - - - 13 96 %  04/30/15 2142 - - - - (!) 27 98 %  04/30/15 2121 - - - - - 97 %  04/30/15 1925 98/66 mmHg 98 F (36.7 C) Oral - 17 100 %  04/30/15 1552 98/68 mmHg 98 F (36.7 C) Oral (!) 104 19 100 %  04/30/15 1518 - - - - - 96 %  04/30/15 1236 - - - - - 96 %  04/30/15 1147 93/65 mmHg 98.2 F (36.8 C) Oral 91 17 94 %  04/30/15 0927 - - - - - 98 %   Current Weight  04/18/15 92 lb (41.731 kg)     Intake/Output from previous day: 10/15 0701 - 10/16 0700 In: 1760 [P.O.:1760] Out: 0     PHYSICAL EXAM:  Heart: RRR Lungs: Clear, slightly decreased on R Chest tube: stable intermittent air leak    Lab Results: CBC:No results for input(s): WBC, HGB, HCT, PLT in the last 72 hours. BMET: No results for input(s): NA, K, CL, CO2, GLUCOSE, BUN, CREATININE, CALCIUM in the last 72 hours.  PT/INR: No results for input(s): LABPROT, INR in the last 72 hours.    Assessment/Plan: S/P Procedure(s) (LRB): VIDEO BRONCHOSCOPY WITH INSERTION OF INTERBRONCHIAL VALVE (IBV) (Right) CXR shows only a very slight increase in R ptx today, air leak intermittent but stable. Will continue CT to water seal for now. Possibly convert to a Mini Express soon if she remains stable.   LOS: 37 days    [name],[name] 05/01/2015  CXR stable pneumo on water seal Still has air leak If CXR stable tomorrow change to mini express  Rem[place] Lipp[place] C. Roxan Hockey, MD [facility] [phone]

## 2015-05-01 NOTE — Discharge Summary (Signed)
[address]       [place] [address]             [phone]            Discharge Summary  Name: Michelle King DOB: 11/01/1960 54 y.o. 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 COPD (chronic obstructive pulmonary disease) ([place])   . Hyperlipidemia   . S/P hysterectomy   . Complication of anesthesia   . PONV (postoperative nausea and vomiting)   . CAD (coronary artery disease)   . Myocardial infarction ([place]) 2010  . Anginal pain ([place])   . Shortness of breath dyspnea   . Pneumonia     hx  . Cancer ([place])     lung      Procedures: VIDEO BRONCHOSCOPY WITH INSERTION OF INTERBRONCHIAL VALVE (IBV) on 03/25/2015 - 04/19/2015    HPI:  The patient is a 54 y.o. female who presented with a 6 month history of cough and hemoptysis.  She was evaluated at [facility] with a CT of the chest and a PET scan, which suggested a possible stage IIIa carcinoma. Biopsy confirmed adenocarcinoma of the right upper lobe. She was treated in [place] with chemotherapy and followup CT scan showed reduction in size of the primary tumor, as well as decrease in size of the right hilar lymph nodes.  She was subsequently referred to Dr. Servando Snare for thoracic surgical evaluation.  It was felt that she should undergo resection at this time. All risks, benefits and alternatives of surgery were explained in detail, and the patient agreed to proceed.  Hospital Course:  The patient was admitted to [facility] on 03/25/2015. The patient was taken to the operating room and underwent the above  procedure.    The postoperative course has been notable for a prolonged air leak from her chest tubes.  This has been treated conservatively, but she has had a persistent right pneumothorax which worsens when her chest tubes have been decreased from suction to water seal.  She was returned to the operating room on 04/12/2015 and endobronchial valves were placed in an attempt to stop the air leak.  Despite this, she continued to show a leak on exam and chest x-ray continued to show a pneumothorax. She required return to the operating room once again on 04/19/2015 and underwent insertion of an additional endobronchial valve.  Since that time, her air leak has remained stable but intermittent.  Chest x-ray has showed gradual improvement of her pneumothorax.  One chest tube has been removed and the remaining tube has been slowly weaned from suction to water seal.  She also developed bloody stools and was evaluated by Gastroenterology.  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 It was suspected that the hemorrhoids were the source of her bleeding and Plavix was resumed. She has had no further bleeding noted.  Presently, the patient's air leak is stable and chest x-ray shows a small but stable pneumothorax on water seal. Incisions are healing well.  She remains afebrile and vital signs are stable. She  is ambulating in the halls without difficulty and is tolerating a regular diet. Final pathology confirmed invasive moderately differentiated adenocarcinoma (T1b, N1).  She will undergo a repeat chest x-ray on 05/01/2015 which was stable.  Her chest tube was placed to a Mini express.  However, this was ultimately not done with proper connectors and she again developed a pneumothorax.  She was placed back on a regular pleurovac with her chest tube to suction for 48 hours.  She had no evidence of air leak or  pneumothorax.  She was again placed on a Mini Express on 05/04/2015.  Follow up CXR remains free from pneumothorax.  There is a small air leak present.  Being her film has remained stable we will discharge the patient home with weekly follow up in our office.  Hopefully we can remove the chest tube in the near future.  She is felt medically stable for discharge today 05/05/2015.   Recent vital signs:  Filed Vitals:   05/05/15 0730  BP:   Pulse:   Temp: 98.4 F (36.9 C)  Resp:     Recent laboratory studies:  CBC:No results for input(s): WBC, HGB, HCT, PLT in the last 72 hours. BMET:   Recent Labs  05/04/15 0515  NA 139  K 4.1  CL 101  CO2 27  GLUCOSE 97  BUN 12  CREATININE 0.50  CALCIUM 9.1    PT/INR: No results for input(s): LABPROT, INR in the last 72 hours.   Discharge Medications:     Medication List    TAKE these medications        acetaminophen 325 MG tablet  Commonly known as:  TYLENOL  Take 650 mg by mouth every 6 (six) hours as needed.     albuterol 108 (90 BASE) MCG/ACT inhaler  Commonly known as:  PROVENTIL HFA;VENTOLIN HFA  Inhale 2 puffs into the lungs every 6 (six) hours as needed for wheezing or shortness of breath.     PRALUENT 75 MG/ML Sopn  Generic drug:  Alirocumab  Inject 1 mL into the skin every 14 (fourteen) days.     Alirocumab 75 MG/ML Sopn  Inject 1 mL into the skin every 14 (fourteen) days.     ALPRAZolam 0.5 MG tablet  Commonly known as:  XANAX  Take 0.5 mg by mouth 3 (three) times daily as needed for anxiety.     aspirin EC 81 MG tablet  Take 81 mg by mouth daily.     BREO ELLIPTA 200-25 MCG/INH Aepb  Generic drug:  Fluticasone Furoate-Vilanterol  Inhale 1 puff into the lungs daily.     clopidogrel 75 MG tablet  Commonly known as:  PLAVIX  Take 75 mg by mouth daily.     docusate sodium 50 MG capsule  Commonly known as:  COLACE  Take 50 mg by mouth 1 day or 1 dose.     Fluticasone Furoate 200 MCG/ACT Aepb  Inhale 200  mcg into the lungs.     GUAIFENESIN PO  Take 400 mg by mouth every 12 (twelve) hours.     HYDROcodone-acetaminophen 10-325 MG tablet  Commonly known as:  NORCO  Take 1 tablet by mouth every 6 (six) hours as needed for moderate pain.     HYDROcodone-acetaminophen 10-325 MG tablet  Commonly known as:  NORCO  Take 1 tablet by mouth every 4 (four) hours as needed for severe pain.     ipratropium 0.02 % nebulizer solution  Commonly known as:  ATROVENT  Take  0.5 mg by nebulization 4 (four) times daily.     polyethylene glycol packet  Commonly known as:  MIRALAX / GLYCOLAX  Take 17 g by mouth daily.     sodium chloride 0.65 % Soln nasal spray  Commonly known as:  OCEAN  Place 1 spray into both nostrils as needed for congestion. Nose bleeds     traMADol 50 MG tablet  Commonly known as:  ULTRAM  Take 1-2 tablets (50-100 mg total) by mouth every 6 (six) hours as needed for moderate pain.         Discharge Instructions:  The patient is to refrain from driving, heavy lifting or strenuous activity.  May shower daily and clean incisions with soap and water.  May resume regular diet.   Follow Up:    Follow-up Information    Follow up with Grace Isaac, MD On 05/10/2015.   Specialty:  Cardiothoracic Surgery   Why:  Appointment is at 2:15   Contact information:   [address] [place] [address] [phone]       Follow up with [facility] On 05/10/2015.   Why:  please get CXR at 1:45   Contact information:   [facility]       Follow up with oncologist.   Why:  call at discharge for follow up appointment       [place], [place] 05/05/2015, 9:21 AM

## 2015-05-02 ENCOUNTER — Inpatient Hospital Stay (HOSPITAL_COMMUNITY): Payer: Medicaid Other

## 2015-05-02 NOTE — Progress Notes (Signed)
Upon assessment pt stated already taken her BREO medication. pt is stable at this time

## 2015-05-02 NOTE — Progress Notes (Addendum)
      [address]       [place] [address]             [phone]      13 Days Post-Op Procedure(s) (LRB): VIDEO BRONCHOSCOPY WITH INSERTION OF INTERBRONCHIAL VALVE (IBV) (Right)   Subjective:  No new complaints.  Continues to have pain at chest tube site.  Objective: Vital signs in last 24 hours: Temp:  [97.9 F (36.6 C)-98.5 F (36.9 C)] 98.2 F (36.8 C) (10/17 0754) Pulse Rate:  [71] 71 (10/16 1944) Cardiac Rhythm:  [-] Normal sinus rhythm (10/17 0701) Resp:  [16-23] 18 (10/17 0616) BP: (91-104)/(55-65) 104/65 mmHg (10/17 0616) SpO2:  [95 %-97 %] 96 % (10/17 0735)  Intake/Output from previous day: 10/16 0701 - 10/17 0700 In: 1080 [P.O.:1080] Out: 0   General appearance: alert, cooperative and no distress Heart: regular rate and rhythm Lungs: clear to auscultation bilaterally Abdomen: soft, non-tender; bowel sounds normal; no masses,  no organomegaly Wound: scab around sutures, minimal erythema  Lab Results: No results for input(s): WBC, HGB, HCT, PLT in the last 72 hours. BMET: No results for input(s): NA, K, CL, CO2, GLUCOSE, BUN, CREATININE, CALCIUM in the last 72 hours.  PT/INR: No results for input(s): LABPROT, INR in the last 72 hours. ABG    Component Value Date/Time   PHART 7.387 04/18/2015 2155   HCO3 25.1* 04/18/2015 2155   TCO2 26.4 04/18/2015 2155   ACIDBASEDEF 0.2 03/23/2015 1353   O2SAT 97.1 04/18/2015 2155   CBG (last 3)  No results for input(s): GLUCAP in the last 72 hours.  Assessment/Plan: S/P Procedure(s) (LRB): VIDEO BRONCHOSCOPY WITH INSERTION OF INTERBRONCHIAL VALVE (IBV) (Right)  1. Chest tube- + air leak on Mini Express- CXR shows increase in pneumothorax- will repeat CXR this afternoon vs. Tomorrow morning 2. Dispo- placed to MINI after CXR, +air leak, will repeat CXR this afternoon vs. AM   LOS: 38 days    BARRETT, ERIN 05/02/2015  Mini express not attached properly to chest tube with air leak in the  systm., placed back on pleurvac until miniexpress hooked up properly. I have seen and examined Ned Card and agree with the above assessment  and plan.  Grace Isaac MD Bee[facility] [phone] Office [phone] 05/02/2015 11:55 PM

## 2015-05-03 ENCOUNTER — Inpatient Hospital Stay (HOSPITAL_COMMUNITY): Payer: Medicaid Other

## 2015-05-03 MED ORDER — KETOROLAC TROMETHAMINE 30 MG/ML IJ SOLN
30.0000 mg | Freq: Once | INTRAMUSCULAR | Status: AC
  Administered 2015-05-03: 30 mg via INTRAVENOUS
  Filled 2015-05-03: qty 1

## 2015-05-03 NOTE — Progress Notes (Signed)
Nutrition Follow-up  DOCUMENTATION CODES:   Underweight  INTERVENTION:    Continue snacks TID to help maximize oral intake of protein and calories.  Husband to bring in food from home or outside of the hospital.  NUTRITION DIAGNOSIS:   Increased nutrient needs related to chronic illness as evidenced by estimated needs.  Ongoing  GOAL:   Patient will meet greater than or equal to 90% of their needs  Progressing  MONITOR:   PO intake, Supplement acceptance, Labs, Weight trends  ASSESSMENT:   54 yo female admitted with lung CA s/p chemotherapy. S/P VATS with right upper lobectomy and lymph node dissection on 9/9.  Patient now with worsening pneumothorax, chest tube to suction. Patient with some chest and back pain today. Appetite is poor, but intake is fair to good most of the time. She has snacks at bedside and husband is bringing in food from outside of hospital for her. She is receiving snacks from the kitchen TID between meals. Weight is down 10 lbs since admission. No new weight in the past 2 weeks.   Diet Order:  Diet regular Room service appropriate?: Yes; Fluid consistency:: Thin  Skin:  Reviewed, no issues  Last BM:  10/16  Height:   Ht Readings from Last 1 Encounters:  04/18/15 '5\' 2"'$  (1.575 m)    Weight:   Wt Readings from Last 1 Encounters:  04/18/15 92 lb (41.731 kg)   03/29/15 102 lb 1.2 oz (46.3 kg)            Ideal Body Weight:  50 kg  BMI:  Body mass index is 16.82 kg/(m^2).  Estimated Nutritional Needs:   Kcal:  1550-1750  Protein:  75-85 gm  Fluid:  1.6-1.8 L  EDUCATION NEEDS:   Education needs addressed  Molli Barrows, [place], [place], [place] Pager [phone] After Hours Pager [phone]

## 2015-05-03 NOTE — Progress Notes (Signed)
UR COMPLETED  

## 2015-05-03 NOTE — Progress Notes (Addendum)
      [address]       [place] [address]             [phone]      14 Days Post-Op Procedure(s) (LRB): VIDEO BRONCHOSCOPY WITH INSERTION OF INTERBRONCHIAL VALVE (IBV) (Right)   Subjective:  Michelle King continues to have pain.  However, this morning its now in her back which is a new location for her.  I explained to her that being her chest tube was placed back on suction, it is likely due to her lung trying to re-expand.  She understands this.  Objective: Vital signs in last 24 hours: Temp:  [98 F (36.7 C)-98.3 F (36.8 C)] 98.2 F (36.8 C) (10/18 0758) Cardiac Rhythm:  [-] Sinus tachycardia (10/18 0758) Resp:  [17-23] 21 (10/18 0726) BP: (89-111)/(58-69) 90/69 mmHg (10/18 0726) SpO2:  [95 %-98 %] 96 % (10/18 0459)  Intake/Output from previous day: 10/17 0701 - 10/18 0700 In: 600 [P.O.:600] Out: -  Intake/Output this shift: Total I/O In: 240 [P.O.:240] Out: 300 [Chest Tube:300]  General appearance: alert, cooperative and mild distress Heart: regular rate and rhythm Lungs: clear to auscultation bilaterally Abdomen: soft, non-tender; bowel sounds normal; no masses,  no organomegaly Wound: clean and dry  Lab Results: No results for input(s): WBC, HGB, HCT, PLT in the last 72 hours. BMET: No results for input(s): NA, K, CL, CO2, GLUCOSE, BUN, CREATININE, CALCIUM in the last 72 hours.  PT/INR: No results for input(s): LABPROT, INR in the last 72 hours. ABG    Component Value Date/Time   PHART 7.387 04/18/2015 2155   HCO3 25.1* 04/18/2015 2155   TCO2 26.4 04/18/2015 2155   ACIDBASEDEF 0.2 03/23/2015 1353   O2SAT 97.1 04/18/2015 2155   CBG (last 3)  No results for input(s): GLUCAP in the last 72 hours.  Assessment/Plan: S/P Procedure(s) (LRB): VIDEO BRONCHOSCOPY WITH INSERTION OF INTERBRONCHIAL VALVE (IBV) (Right)  1. Chest tube- back on suction this morning, no air leak appreciated, apparently MINI express was connected incorrectly...  CXR with increase in hydropneumothorax.... Leave on suction for now 2. Pain- using Tramadol without much relief.... Will give a 1 time dose of Toradol this morning, repeat BMET in AM to ensure creatinine remains stable 3. Dispo- patient with worsening pneumothorax, chest tube on suction, will try Toradol for pain... Continue current care  LOS: 39 days    BARRETT, ERIN 05/03/2015  Follow up chest xray at noon much improved. I have seen and examined Michelle King and agree with the above assessment  and plan.  Grace Isaac MD Beeper [phone] Office [phone] 05/03/2015 12:19 PM

## 2015-05-03 NOTE — Progress Notes (Signed)
@  2350 per Dr. Everrett Coombe verbal bedside order, pt changed from mini-express back to [place] '@H20'$  seal. Beyond complaints of her persistent pain, pt denied difficulty breathing or any other issues. '100mg'$  Ultram administered for pain. Will continue to monitor and assess.

## 2015-05-04 ENCOUNTER — Inpatient Hospital Stay (HOSPITAL_COMMUNITY): Payer: Medicaid Other

## 2015-05-04 LAB — BASIC METABOLIC PANEL
Anion gap: 11 (ref 5–15)
BUN: 12 mg/dL (ref 6–20)
CO2: 27 mmol/L (ref 22–32)
CREATININE: 0.5 mg/dL (ref 0.44–1.00)
Calcium: 9.1 mg/dL (ref 8.9–10.3)
Chloride: 101 mmol/L (ref 101–111)
GFR calc Af Amer: >60 mL/min (ref >60)
GFR calc non Af Amer: >60 mL/min (ref >60)
GLUCOSE: 97 mg/dL (ref 65–99)
Potassium: 4.1 mmol/L (ref 3.5–5.1)
Sodium: 139 mmol/L (ref 135–145)

## 2015-05-04 MED ORDER — KETOROLAC TROMETHAMINE 15 MG/ML IJ SOLN
15.0000 mg | Freq: Four times a day (QID) | INTRAMUSCULAR | Status: DC | PRN
  Administered 2015-05-04 (×2): 15 mg via INTRAVENOUS
  Filled 2015-05-04 (×2): qty 1

## 2015-05-04 MED ORDER — SALINE SPRAY 0.65 % NA SOLN
1.0000 | NASAL | Status: DC | PRN
  Filled 2015-05-04: qty 44

## 2015-05-04 NOTE — Progress Notes (Signed)
Erin Barrett PA injected 179m of sterile saline into mini express system to assess for an air leak.

## 2015-05-04 NOTE — Progress Notes (Signed)
   05/04/15 1700  Clinical Encounter Type  Visited With Patient;Other (Comment) (Patient's friends)  Visit Type Spiritual support  Referral From Other (Comment);Patient (Friend)  Consult/Referral To Chaplain  Spiritual Encounters  Spiritual Needs Prayer;Emotional  Stress Factors  Patient Stress Factors Family relationships;Financial concerns;Health changes;Major life changes;Loss of control (Social situation in general)  Patient had two friends with her. She expressed feeling that God was punishing her with cancer because she had asked to die. Chaplain discussed her feelings about this and prayed with her. Patient expressed strong desire to have chaplain return when husband is there, from 8 am to 2 pm. Chaplain should be advised there are multiple issues facing couple and family. Patient has been in hospital for 40 days.

## 2015-05-04 NOTE — Progress Notes (Addendum)
      [address]       [place] [address]             [phone]      15 Days Post-Op Procedure(s) (LRB): VIDEO BRONCHOSCOPY WITH INSERTION OF INTERBRONCHIAL VALVE (IBV) (Right)   Subjective:  Michelle King continues to have right sided chest pain.  She rates it a 10/10... Toradol provided relief yesterday, Husband at bedside again with multiple questions  Objective: Vital signs in last 24 hours: Temp:  [98.4 F (36.9 C)-99.2 F (37.3 C)] 99.1 F (37.3 C) (10/19 0700) Cardiac Rhythm:  [-] Sinus tachycardia (10/19 0757) Resp:  [18-24] 23 (10/19 0734) BP: (90-147)/(61-122) 147/122 mmHg (10/19 0734) SpO2:  [96 %-98 %] 96 % (10/19 0827)  Intake/Output from previous day: 10/18 0701 - 10/19 0700 In: 1080 [P.O.:1080] Out: 400 [Chest Tube:400] I General appearance: alert, cooperative and no distress Heart: regular rate and rhythm Lungs: clear to auscultation bilaterally Abdomen: soft, non-tender; bowel sounds normal; no masses,  no organomegaly Wound: clean andd ry  Lab Results: No results for input(s): WBC, HGB, HCT, PLT in the last 72 hours. BMET:  Recent Labs  05/04/15 0515  NA 139  K 4.1  CL 101  CO2 27  GLUCOSE 97  BUN 12  CREATININE 0.50  CALCIUM 9.1    PT/INR: No results for input(s): LABPROT, INR in the last 72 hours. ABG    Component Value Date/Time   PHART 7.387 04/18/2015 2155   HCO3 25.1* 04/18/2015 2155   TCO2 26.4 04/18/2015 2155   ACIDBASEDEF 0.2 03/23/2015 1353   O2SAT 97.1 04/18/2015 2155   CBG (last 3)  No results for input(s): GLUCAP in the last 72 hours.  Assessment/Plan: S/P Procedure(s) (LRB): VIDEO BRONCHOSCOPY WITH INSERTION OF INTERBRONCHIAL VALVE (IBV) (Right)  1. Chest tube- 400 cc output present, no air leak appreciated, CXR with no pneumothorax- possibly decrease suction to 10 cm vs. Water seal... Will discuss with Dr. Servando Snare 2. Pain control- patient continues to have uncontrolled pain... Continue Oxy,  Tramadol, creatinine is okay... Will add Toradol at 15 mg Q 6 prn for additional relieve 3. Nose Bleeds- try nasal saline 4. DIspo- patient stable, continues to have pain, will add toradol... Chest tube management per Dr. Servando Snare   LOS: 40 days    Ellwood Handler 05/04/2015  Placed on miniexpress again today with proper connectors Follow up xray in am I have seen and examined Michelle King and agree with the above assessment  and plan.  Grace Isaac MD Beeper [phone] Office [phone] 05/04/2015 6:28 PM

## 2015-05-04 NOTE — Progress Notes (Signed)
Pt chest tube drainage system changed to a mini express system per MD request. Pt tolerated procedure well. Breath sounds clear to ascultation both prior and after transition and vital signs WDL. Will continue to monitor.

## 2015-05-05 ENCOUNTER — Inpatient Hospital Stay (HOSPITAL_COMMUNITY): Payer: Medicaid Other

## 2015-05-05 MED ORDER — HYDROCODONE-ACETAMINOPHEN 10-325 MG PO TABS: 1.0000 | ORAL_TABLET | ORAL | Status: DC | PRN

## 2015-05-05 MED ORDER — TRAMADOL HCL 50 MG PO TABS: 50.0000 mg | ORAL_TABLET | Freq: Four times a day (QID) | ORAL | Status: DC | PRN

## 2015-05-05 MED ORDER — POLYETHYLENE GLYCOL 3350 17 G PO PACK: 17.0000 g | PACK | Freq: Every day | ORAL | Status: DC

## 2015-05-05 MED ORDER — LACTULOSE 10 GM/15ML PO SOLN
20.0000 g | Freq: Every day | ORAL | Status: DC | PRN
  Administered 2015-05-05: 20 g via ORAL
  Filled 2015-05-05 (×2): qty 30

## 2015-05-05 MED ORDER — SALINE SPRAY 0.65 % NA SOLN: 1.0000 | NASAL | Status: DC | PRN

## 2015-05-05 NOTE — Discharge Instructions (Signed)
Video Assisted, Care After Refer to this sheet in the next few weeks. These instructions provide you with information on caring for yourself after your procedure. Your caregiver may also give you more specific instructions. Your procedure has been planned according to current medical practices, but problems sometimes occur. Call your caregiver if you have any problems or questions after your procedure. HOME CARE INSTRUCTIONS   Only take over-the-counter or prescription medications as directed.  Only take pain medications (narcotics) as directed.  Do not drive until your caregiver approves. Driving while taking narcotics or soon after surgery can be dangerous, so discuss the specific timing with your caregiver.  Avoid activities that use your chest muscles, such as lifting heavy objects, for at least 3-4 weeks.   Take deep breaths to expand the lungs and to protect against pneumonia.  Do breathing exercises as directed by your caregiver. If you were given an incentive spirometer to help with breathing, use it as directed.  You may resume a normal diet and activities when you feel you are able to or as directed.  Do not take a bath until your caregiver says it is OK. Use the shower instead.   Keep the bandage (dressing) covering the area where the chest tube was inserted (incision site) dry for 48 hours. After 48 hours, remove the dressing unless there is new drainage.  Remove dressings as directed by your caregiver.  Change dressings if necessary or as directed.  Keep all follow-up appointments. It is important for you to see your caregiver after surgery to discuss appropriate follow-up care and surveillance, if it is necessary. SEEK MEDICAL CARE:  You feel excessive or increasing pain at an incision site.  You notice bleeding, skin irritation, drainage, swelling, or redness at an incision site.  There is a bad smell coming from an incision or dressing.  It feels like your heart  is fluttering or beating rapidly.  Your pain medication does not relieve your pain. SEEK IMMEDIATE MEDICAL CARE IF:   You have a fever.   You have chest pain.  You have a rash.  You have shortness of breath.  You have trouble breathing.   You feel weak, lightheaded, dizzy, or faint.  MAKE SURE YOU:   Understand these instructions.   Will watch your condition.   Will get help right away if you are not doing well or get worse.   This information is not intended to replace advice given to you by your health care provider. Make sure you discuss any questions you have with your health care provider.   Document Released: 10/27/2012 Document Revised: 07/23/2014 Document Reviewed: 10/27/2012 Elsevier Interactive Patient Education 2016 Elsevier    Pneumothorax A pneumothorax, commonly called a collapsed lung, is a condition in which air leaks from a lung and builds up in the space between the lung and the chest wall (pleural space). The air in a pneumothorax is trapped outside the lung and takes up space, preventing the lung from fully expanding. This is a condition that usually occurs suddenly. The buildup of air may be small or large. A small pneumothorax may go away on its own. When a pneumothorax is larger, it will often require medical treatment and hospitalization.  CAUSES  A pneumothorax can sometimes happen quickly with no apparent cause. People with underlying lung problems, particularly COPD or emphysema, are at higher risk of pneumothorax. However, pneumothorax can happen quickly even in people with no prior known lung problems. Trauma, surgery, medical procedures,  or injury to the chest wall can also cause a pneumothorax. SIGNS AND SYMPTOMS  Sometimes a pneumothorax will have no symptoms. When symptoms are present, they can include:  Chest pain.  Shortness of breath.  Increased rate of breathing.  Bluish color to your lips or skin (cyanosis). DIAGNOSIS    Pneumothorax is usually diagnosed by a chest X-ray or chest CT scan. Your health care provider will also take a medical history and perform a physical exam to determine why you may have a pneumothorax. TREATMENT  A small pneumothorax may go away on its own without treatment. Extra oxygen can sometimes help a small pneumothorax go away more quickly. For a larger pneumothorax or a pneumothorax that is causing symptoms, a procedure is usually needed to drain the air.In some cases, the health care provider may drain the air using a needle. In other cases, a chest tube may be inserted into the pleural space. A chest tube is a small tube placed between the ribs and into the pleural space. This removes the extra air and allows the lung to expand back to its normal size. A large pneumothorax will usually require a hospital stay. If there is ongoing air leakage into the pleural space, then the chest tube may need to remain in place for several days until the air leak has healed. In some cases, surgery may be needed.  HOME CARE INSTRUCTIONS   Only take over-the-counter or prescription medicines as directed by your health care provider.  If a cough or pain makes it difficult for you to sleep at night, try sleeping in a semi-upright position in a recliner or by using 2 or 3 pillows.  Rest and limit activity as directed by your health care provider.  If you had a chest tube and it was removed, ask your health care provider when it is okay to remove the dressing. Until your health care provider says you can remove the dressing, do not allow it to get wet.  Do not smoke. Smoking is a risk factor for pneumothorax.  Do not fly in an airplane or scuba dive until your health care provider says it is okay.  Follow up with your health care provider as directed. SEEK IMMEDIATE MEDICAL CARE IF:   You have increasing chest pain or shortness of breath.  You have a cough that is not controlled with  suppressants.  You begin coughing up blood.  You have pain that is getting worse or is not controlled with medicines.  You cough up thick, discolored mucus (sputum) that is yellow to green in color.  You have redness, increasing pain, or discharge at the site where a chest tube had been in place (if your pneumothorax was treated with a chest tube).  The site where your chest tube was located opens up.  You feel air coming out of the site where the chest tube was placed.  You have a fever or persistent symptoms for more than 2-3 days.  You have a fever and your symptoms suddenly get worse. MAKE SURE YOU:   Understand these instructions.  Will watch your condition.  Will get help right away if you are not doing well or get worse.   This information is not intended to replace advice given to you by your health care provider. Make sure you discuss any questions you have with your health care provider.   Document Released: 07/02/2005 Document Revised: 04/22/2013 Document Reviewed: 01/29/2013 Elsevier Interactive Patient Education [employer].

## 2015-05-05 NOTE — Progress Notes (Signed)
Fentanyl 52mg given this am. 750m wasted with CaPecolia AdesN. ChCarroll KindsN

## 2015-05-05 NOTE — Progress Notes (Addendum)
      [address]       [place] [address]             [phone]      16 Days Post-Op Procedure(s) (LRB): VIDEO BRONCHOSCOPY WITH INSERTION OF INTERBRONCHIAL VALVE (IBV) (Right)   Subjective:  Continues to have incisional pain. Has not moved her bowels.    Wants to go home.  Objective: Vital signs in last 24 hours: Temp:  [98 F (36.7 C)-99.7 F (37.6 C)] 98.4 F (36.9 C) (10/20 0730) Cardiac Rhythm:  [-] Sinus tachycardia (10/20 0817) Resp:  [15-22] 22 (10/20 0514) BP: (90-132)/(49-85) 116/72 mmHg (10/20 0514) SpO2:  [93 %-99 %] 95 % (10/20 0818)  General appearance: alert, cooperative and no distress Heart: regular rate and rhythm Lungs: clear to auscultation bilaterally Abdomen: soft, non-tender; bowel sounds normal; no masses,  no organomegaly Wound: clean and dry  Lab Results: No results for input(s): WBC, HGB, HCT, PLT in the last 72 hours. BMET:  Recent Labs  05/04/15 0515  NA 139  K 4.1  CL 101  CO2 27  GLUCOSE 97  BUN 12  CREATININE 0.50  CALCIUM 9.1    PT/INR: No results for input(s): LABPROT, INR in the last 72 hours. ABG    Component Value Date/Time   PHART 7.387 04/18/2015 2155   HCO3 25.1* 04/18/2015 2155   TCO2 26.4 04/18/2015 2155   ACIDBASEDEF 0.2 03/23/2015 1353   O2SAT 97.1 04/18/2015 2155   CBG (last 3)  No results for input(s): GLUCAP in the last 72 hours.  Assessment/Plan: S/P Procedure(s) (LRB): VIDEO BRONCHOSCOPY WITH INSERTION OF INTERBRONCHIAL VALVE (IBV) (Right)  1. Chest tube- + air leak, CXR is free from pneumothorax, there is a small pleural effusion- continue Mini Express 2.- GI will give lactulose for constipation, start Miralax as long as taking narcotics 3. Pain control- Oxy, Tramadol at discharge 4. Dispo- patient is stable, CXR shows no pneumothorax on Mini Express, will d/c home today    LOS: 41 days    King, Michelle 05/05/2015  Very small air leak now with cough Chest xray  stable Plan home today Fu in office early next week with chest xray I have seen and examined Michelle King and agree with the above assessment  and plan.  Grace Isaac MD Beeper [phone] Office [phone] 05/05/2015 12:54 PM

## 2015-05-07 LAB — AFB CULTURE WITH SMEAR (NOT AT ARMC): Acid Fast Smear: NONE SEEN

## 2015-05-09 ENCOUNTER — Other Ambulatory Visit: Payer: Self-pay | Admitting: Cardiothoracic Surgery

## 2015-05-09 DIAGNOSIS — C3490 Malignant neoplasm of unspecified part of unspecified bronchus or lung: Secondary | ICD-10-CM

## 2015-05-10 ENCOUNTER — Ambulatory Visit (INDEPENDENT_AMBULATORY_CARE_PROVIDER_SITE_OTHER): Payer: Self-pay | Admitting: Cardiothoracic Surgery

## 2015-05-10 ENCOUNTER — Ambulatory Visit
Admission: RE | Admit: 2015-05-10 | Discharge: 2015-05-10 | Disposition: A | Discharge status: Home / Self Care | Payer: Medicaid Other | Source: Ambulatory Visit | Attending: Cardiothoracic Surgery | Admitting: Cardiothoracic Surgery

## 2015-05-10 ENCOUNTER — Other Ambulatory Visit: Payer: Self-pay | Admitting: *Deleted

## 2015-05-10 ENCOUNTER — Encounter: Payer: Self-pay | Admitting: Cardiothoracic Surgery

## 2015-05-10 ENCOUNTER — Other Ambulatory Visit: Payer: Self-pay | Admitting: Cardiothoracic Surgery

## 2015-05-10 VITALS — BP 111/77 | HR 116 | Resp 16 | Ht 62.0 in | Wt 97.0 lb

## 2015-05-10 DIAGNOSIS — J9382 Other air leak: Secondary | ICD-10-CM

## 2015-05-10 DIAGNOSIS — C3411 Malignant neoplasm of upper lobe, right bronchus or lung: Secondary | ICD-10-CM

## 2015-05-10 DIAGNOSIS — IMO0002 Reserved for concepts with insufficient information to code with codable children: Secondary | ICD-10-CM

## 2015-05-10 DIAGNOSIS — C3490 Malignant neoplasm of unspecified part of unspecified bronchus or lung: Secondary | ICD-10-CM

## 2015-05-10 DIAGNOSIS — Z902 Acquired absence of lung [part of]: Secondary | ICD-10-CM

## 2015-05-10 MED ORDER — HYDROCODONE-ACETAMINOPHEN 10-325 MG PO TABS: 1.0000 | ORAL_TABLET | ORAL | Status: DC | PRN

## 2015-05-10 NOTE — Op Note (Signed)
NAMESHARICKA, Michelle King NO.:  [id number]  MEDICAL RECORD NO.:  [id number]  LOCATION:  3S11C                        FACILITY:  [facility]  PHYSICIAN:  Lanelle Bal, MD    DATE OF BIRTH:  May 08, 1961  DATE OF PROCEDURE:  04/19/2015 DATE OF DISCHARGE:  05/05/2015                              OPERATIVE REPORT   PREOPERATIVE DIAGNOSIS:  Persistent air leak after right upper lobectomy.  POSTOPERATIVE DIAGNOSIS:  Persistent air leak after right upper lobectomy.  SURGICAL PROCEDURE:  Repeat video bronchoscopy with insertion of 1 additional bronchial valve.  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 Postoperatively, she developed persistent air leak.  The week previously, she had undergone bronchoscopy showing the bronchial stump to be intact and 3 bronchial valves were placed with what appeared to be significant decrease in air leak; however, after insertion of the first three valves, she although improved, still had an air leak that prevented coming off suction.  We decided to recommend to the patient that we take an additional look with the bronchoscope to ensure the proper placement of the first valve and potentially place an additional one.  The patient agreed and signed informed consent.  DESCRIPTION OF PROCEDURE:  The patient was under general endotracheal anesthesia.  Appropriate time-out was performed.  Video bronchoscope was passed through the endotracheal tube.  There were very little in the way of pulmonary secretions evident.  The left tracheobronchial tree was clear.  We then examined the right tracheobronchial tree and as noted previously the right upper lobe bronchus was intact.  As the bronchus intervened, it was also without significant secretions.  The previously placed valve especially the one in the medial basilar segment were all well positioned and did not  appear to have migrated.  We again tried isolating the air leak and using an occlusive balloon to the lower lobe, the air leak completely stopped.  This was not the case with the balloon in the middle lobe.  We continued to isolate this in the lateral basilar segment, there was no valve occluding this segment markedly decreased the air leak.  We decided to instill one additional pulmonary valve. This was loaded in the small measuring balloon, estimated the size as 7 mm.  A 7 mm bronchial valve was selected and loaded on the deploying device and then it was positioned in the lateral segment of the lower lobe and was depleted, it seated nicely.  The air leak appeared to stop. Following this, scopes were removed and the patient extubated in the operating room and transferred to the recovery room for further postoperative care.     Lanelle Bal, MD     EG/MEDQ  D:  05/10/2015  T:  05/10/2015  Job:  [id number]

## 2015-05-10 NOTE — Progress Notes (Signed)
[address]       [place] [address]             [phone]      Alli Prieur [place] Medical Record #[id number] Date of Birth: 1961/05/19  Referring: Marice Potter, MD Primary Care: Charlotte Sanes, MD  Chief Complaint:   POST OP FOLLOW UP  Lung cancer-right upper lobe   Staging form: Lung, AJCC 7th Edition     Clinical: No stage assigned - Unsigned     Pathologic stage from 03/29/2015: Stage IIA (yT1b, N1, cM0) - Unsigned  03/25/2015  OPERATIVE REPORT PREOPERATIVE DIAGNOSIS: Adenocarcinoma, right upper lobe, status post chemotherapy. POSTOPERATIVE DIAGNOSIS: Adenocarcinoma, right upper lobe, status post chemotherapy. SURGICAL PROCEDURE: 1. Video bronchoscopy. 2. Right video-assisted thoracoscopy with right upper lobectomy. 3. Lymph node dissection. 4. Placement of ON-Q. SURGEON: Lanelle Bal, M.D History of Present Illness:     Patient returns to the office after being hospitalized for right upper lobectomy, her course was prolonged secondary to prolonged air leak. Air leak ultimately resolved with placing intrapulmonary valves. She comes to the office today with her chest tube in place, stable appearing chest x-ray and no evidence of air leak. She's been doing well at home.     Past Medical History  Diagnosis Date  . COPD (chronic obstructive pulmonary disease) ([facility])   . Hyperlipidemia   . S/P hysterectomy   . Complication of anesthesia   . PONV (postoperative nausea and vomiting)   . CAD (coronary artery disease)   . Myocardial infarction ([place]) 2010  . Anginal pain ([place])   . Shortness of breath dyspnea   . Pneumonia     hx  . Cancer ([place])     lung     History  Smoking status  . Current Every Day Smoker -- 0.50 packs/day for 40 years  . Types: Cigarettes  Smokeless tobacco  . Never Used    Comment: down to 4 aday    History  Alcohol Use No     Allergies  Allergen Reactions  . Codeine Itching    Facial  swelling Tolerates norco  . Rosuvastatin   . Sympathomimetics     Current Outpatient Prescriptions  Medication Sig Dispense Refill  . acetaminophen (TYLENOL) 325 MG tablet Take 650 mg by mouth every 6 (six) hours as needed.    Marland Kitchen albuterol (PROVENTIL HFA;VENTOLIN HFA) 108 (90 BASE) MCG/ACT inhaler Inhale 2 puffs into the lungs every 6 (six) hours as needed for wheezing or shortness of breath.    . Alirocumab (PRALUENT) 75 MG/ML SOPN Inject 1 mL into the skin every 14 (fourteen) days.    . Alirocumab 75 MG/ML SOPN Inject 1 mL into the skin every 14 (fourteen) days.     . ALPRAZolam (XANAX) 0.5 MG tablet Take 0.5 mg by mouth 3 (three) times daily as needed for anxiety.    Marland Kitchen aspirin EC 81 MG tablet Take 81 mg by mouth daily.    . clopidogrel (PLAVIX) 75 MG tablet Take 75 mg by mouth daily.    Marland Kitchen docusate sodium (COLACE) 50 MG capsule Take 50 mg by mouth 1 day or 1 dose.    Marland Kitchen Fluticasone Furoate 200 MCG/ACT AEPB Inhale 200 mcg into the lungs.    . Fluticasone Furoate-Vilanterol (BREO ELLIPTA) 200-25 MCG/INH AEPB Inhale 1 puff into the lungs daily.    . GUAIFENESIN PO Take 400 mg by mouth every 12 (twelve) hours.    Marland Kitchen HYDROcodone-acetaminophen ([place])  10-325 MG tablet Take 1 tablet by mouth every 4 (four) hours as needed for severe pain. 30 tablet 0  . ipratropium (ATROVENT) 0.02 % nebulizer solution Take 0.5 mg by nebulization 4 (four) times daily.    . polyethylene glycol (MIRALAX / GLYCOLAX) packet Take 17 g by mouth daily. 30 each 12  . sodium chloride (OCEAN) 0.65 % SOLN nasal spray Place 1 spray into both nostrils as needed for congestion. Nose bleeds  0  . traMADol (ULTRAM) 50 MG tablet Take 1-2 tablets (50-100 mg total) by mouth every 6 (six) hours as needed for moderate pain. 30 tablet 0   No current facility-administered medications for this visit.       Physical Exam: BP 111/77 mmHg  Pulse 116  Resp 16  Ht '5\' 2"'$  (1.575 m)  Wt 97 lb (43.999 kg)  BMI 17.74 kg/m2  SpO2  97%  General appearance: alert and cooperative Neurologic: intact Heart: regular rate and rhythm, S1, S2 normal, no murmur, click, rub or gallop Lungs: diminished breath sounds bibasilar Abdomen: soft, non-tender; bowel sounds normal; no masses,  no organomegaly Extremities: extremities normal, atraumatic, no cyanosis or edema and Homans sign is negative, no sign of DVT Wound: well healed , no air leak from tube   Diagnostic Studies & Laboratory data:     Recent Radiology Findings:   Dg Chest 2 View  05/10/2015  CLINICAL DATA:  Follow-up right chest tube EXAM: CHEST  2 VIEW COMPARISON:  05/05/2015 FINDINGS: Cardiomediastinal silhouette is stable. Postsurgical changes right hemi thorax again noted. Stable right chest tube position. No acute infiltrate. No pulmonary edema. There is no pneumothorax. Left lung is clear. Persistent trace right pleural effusion and blunting of the right costophrenic angle. IMPRESSION: No active cardiopulmonary disease. Stable right chest tube position. Stable postsurgical changes right hilum. No pneumothorax. Electronically Signed   By: Lahoma Crocker M.D.   On: 05/10/2015 13:43      Recent Lab Findings: Lab Results  Component Value Date   WBC 5.8 04/18/2015   HGB 7.6* 04/18/2015   HCT 23.9* 04/18/2015   PLT 334 04/18/2015   GLUCOSE 97 05/04/2015   CHOL  04/19/2009    190        ATP III CLASSIFICATION:  <200     mg/dL   Desirable  200-239  mg/dL   Borderline High  >=240    mg/dL   High        SLIGHT HEMOLYSIS   TRIG 116 SLIGHT HEMOLYSIS 04/19/2009   HDL 36 SLIGHT HEMOLYSIS* 04/19/2009   LDLCALC * 04/19/2009    131        Total Cholesterol/HDL:CHD Risk Coronary Heart Disease Risk Table                     Men   Women  1/2 Average Risk   3.4   3.3  Average Risk       5.0   4.4  2 X Average Risk   9.6   7.1  3 X Average Risk  23.4   11.0        Use the calculated Patient Ratio above and the CHD Risk Table to determine the patient's CHD Risk.         ATP III CLASSIFICATION (LDL):  <100     mg/dL   Optimal  100-129  mg/dL   Near or Above  Optimal  130-159  mg/dL   Borderline  160-189  mg/dL   High  >190     mg/dL   Very High   ALT 10* 04/18/2015   AST 17 04/18/2015   NA 139 05/04/2015   K 4.1 05/04/2015   CL 101 05/04/2015   CREATININE 0.50 05/04/2015   BUN 12 05/04/2015   CO2 27 05/04/2015   TSH 1.539 Test methodology is 3rd generation TSH 04/19/2009   INR 1.15 04/18/2015      Assessment / Plan:     No air leak today and chest xray looks good , chest tube removed in the office today and follow up film done prior to going home follow up visit 7-10 days with chest xray. Will need valves removed in 3-4 weeks. Patient will return to see Dr Bobby Rumpf to consider further post surgical treatment after resection of right upper lobe  Lung cancer-right upper lobe   Staging form: Lung, AJCC 7th Edition     Clinical: T2, N2, M0 - Signed by Grace Isaac, MD on 05/11/2015     Pathologic stage from 03/29/2015: Stage IIA (yT1b, N1, cM0) - Signed by Grace Isaac, MD on 05/11/2015     Grace Isaac MD      [address] [place] [address] Office [phone]   Beeper [phone]  05/10/2015 2:39 PM

## 2015-05-13 ENCOUNTER — Other Ambulatory Visit: Payer: Self-pay | Admitting: Cardiothoracic Surgery

## 2015-05-13 DIAGNOSIS — C3490 Malignant neoplasm of unspecified part of unspecified bronchus or lung: Secondary | ICD-10-CM

## 2015-05-16 ENCOUNTER — Encounter: Payer: Self-pay | Admitting: Cardiothoracic Surgery

## 2015-05-16 ENCOUNTER — Ambulatory Visit (INDEPENDENT_AMBULATORY_CARE_PROVIDER_SITE_OTHER): Payer: Self-pay | Admitting: Cardiothoracic Surgery

## 2015-05-16 ENCOUNTER — Ambulatory Visit
Admission: RE | Admit: 2015-05-16 | Discharge: 2015-05-16 | Disposition: A | Discharge status: Home / Self Care | Payer: Medicaid Other | Source: Ambulatory Visit | Attending: Cardiothoracic Surgery | Admitting: Cardiothoracic Surgery

## 2015-05-16 VITALS — BP 114/79 | HR 110 | Resp 20 | Ht 62.0 in | Wt 96.0 lb

## 2015-05-16 DIAGNOSIS — Z902 Acquired absence of lung [part of]: Secondary | ICD-10-CM

## 2015-05-16 DIAGNOSIS — IMO0002 Reserved for concepts with insufficient information to code with codable children: Secondary | ICD-10-CM

## 2015-05-16 DIAGNOSIS — C3411 Malignant neoplasm of upper lobe, right bronchus or lung: Secondary | ICD-10-CM

## 2015-05-16 DIAGNOSIS — J9382 Other air leak: Secondary | ICD-10-CM

## 2015-05-16 DIAGNOSIS — C3490 Malignant neoplasm of unspecified part of unspecified bronchus or lung: Secondary | ICD-10-CM

## 2015-05-16 MED ORDER — HYDROCODONE-ACETAMINOPHEN 5-325 MG PO TABS: 1.0000 | ORAL_TABLET | Freq: Four times a day (QID) | ORAL | Status: DC | PRN

## 2015-05-16 NOTE — Progress Notes (Signed)
[address]       [place] [address]             [phone]      Mithra Hilburn [facility] Medical Record #[id number] Date of Birth: 1961-01-19  Referring: Marice Potter, MD Primary Care: Charlotte Sanes, MD  Chief Complaint:   POST OP FOLLOW UP  Lung cancer-right upper lobe   Staging form: Lung, AJCC 7th Edition     Clinical: No stage assigned - Unsigned     Pathologic stage from 03/29/2015: Stage IIA (yT1b, N1, cM0) - Unsigned  03/25/2015  OPERATIVE REPORT PREOPERATIVE DIAGNOSIS: Adenocarcinoma, right upper lobe, status post chemotherapy. POSTOPERATIVE DIAGNOSIS: Adenocarcinoma, right upper lobe, status post chemotherapy. SURGICAL PROCEDURE: 1. Video bronchoscopy. 2. Right video-assisted thoracoscopy with right upper lobectomy. 3. Lymph node dissection. 4. Placement of ON-Q. SURGEON: Lanelle Bal, M.D History of Present Illness:     Patient returns to the office after being hospitalized for right upper lobectomy, her course was prolonged secondary to prolonged air leak. Air leak ultimately resolved with placing intrapulmonary valves. She comes to the office today with her chest tube in place, stable appearing chest x-ray and no evidence of air leak. She's been doing well at home.     Past Medical History  Diagnosis Date  . COPD (chronic obstructive pulmonary disease) ([place])   . Hyperlipidemia   . S/P hysterectomy   . Complication of anesthesia   . PONV (postoperative nausea and vomiting)   . CAD (coronary artery disease)   . Myocardial infarction ([place]) 2010  . Anginal pain ([place])   . Shortness of breath dyspnea   . Pneumonia     hx  . Cancer ([place])     lung     History  Smoking status  . Current Every Day Smoker -- 0.50 packs/day for 40 years  . Types: Cigarettes  Smokeless tobacco  . Never Used    Comment: down to 4 aday    History  Alcohol Use No     Allergies  Allergen Reactions  . Codeine Itching    Facial  swelling Tolerates norco  . Rosuvastatin   . Sympathomimetics     Current Outpatient Prescriptions  Medication Sig Dispense Refill  . acetaminophen (TYLENOL) 325 MG tablet Take 650 mg by mouth every 6 (six) hours as needed.    Marland Kitchen albuterol (PROVENTIL HFA;VENTOLIN HFA) 108 (90 BASE) MCG/ACT inhaler Inhale 2 puffs into the lungs every 6 (six) hours as needed for wheezing or shortness of breath.    . Alirocumab (PRALUENT) 75 MG/ML SOPN Inject 1 mL into the skin every 14 (fourteen) days.    . Alirocumab 75 MG/ML SOPN Inject 1 mL into the skin every 14 (fourteen) days.     . ALPRAZolam (XANAX) 0.5 MG tablet Take 0.5 mg by mouth 3 (three) times daily as needed for anxiety.    Marland Kitchen aspirin EC 81 MG tablet Take 81 mg by mouth daily.    . clopidogrel (PLAVIX) 75 MG tablet Take 75 mg by mouth daily.    Marland Kitchen docusate sodium (COLACE) 50 MG capsule Take 50 mg by mouth 1 day or 1 dose.    Marland Kitchen Fluticasone Furoate 200 MCG/ACT AEPB Inhale 200 mcg into the lungs.    . Fluticasone Furoate-Vilanterol (BREO ELLIPTA) 200-25 MCG/INH AEPB Inhale 1 puff into the lungs daily.    . GUAIFENESIN PO Take 400 mg by mouth every 12 (twelve) hours.    Marland Kitchen ipratropium (ATROVENT)  0.02 % nebulizer solution Take 0.5 mg by nebulization 4 (four) times daily.    . polyethylene glycol (MIRALAX / GLYCOLAX) packet Take 17 g by mouth daily. 30 each 12  . sodium chloride (OCEAN) 0.65 % SOLN nasal spray Place 1 spray into both nostrils as needed for congestion. Nose bleeds  0  . traMADol (ULTRAM) 50 MG tablet Take 1-2 tablets (50-100 mg total) by mouth every 6 (six) hours as needed for moderate pain. 30 tablet 0  . HYDROcodone-acetaminophen (NORCO/VICODIN) 5-325 MG tablet Take 1 tablet by mouth every 6 (six) hours as needed for moderate pain or severe pain. 40 tablet 0   No current facility-administered medications for this visit.       Physical Exam: BP 114/79 mmHg  Pulse 110  Resp 20  Ht '5\' 2"'$  (1.575 m)  Wt 96 lb (43.545 kg)  BMI  17.55 kg/m2  SpO2 94%  General appearance: alert and cooperative Neurologic: intact Heart: regular rate and rhythm, S1, S2 normal, no murmur, click, rub or gallop Lungs: diminished breath sounds bibasilar Abdomen: soft, non-tender; bowel sounds normal; no masses,  no organomegaly Extremities: extremities normal, atraumatic, no cyanosis or edema and Homans sign is negative, no sign of DVT Wound: well healed , tube site has healed over    Diagnostic Studies & Laboratory data:     Recent Radiology Findings:   Dg Chest 2 View  05/16/2015  CLINICAL DATA:  History of right upper lobe lung malignancy with surgeryin September 2016, chest pain, history of COPD and coronary artery disease. EXAM: CHEST  2 VIEW COMPARISON:  PA and lateral chest x-ray of May 10, 2015 FINDINGS: There remains a small right pleural effusion. A small meniscus is visible on the frontal but not lateral view. No definite pleural line is observed. The left lung is clear. There are numerous bronchial stents present in the right hilum. There is minimal shift of mediastinum toward the right which is stable. The heart and pulmonary vascularity are normal. IMPRESSION: Persistent small left pleural effusion. There may be a small loculated hydropneumothorax given the meniscus in the lower hemi thorax peer. No classic pleural line of the pneumothorax is observed. Overall there has not been significant interval change since the previous study. Electronically Signed   By: David  Martinique M.D.   On: 05/16/2015 14:02      Recent Lab Findings: Lab Results  Component Value Date   WBC 5.8 04/18/2015   HGB 7.6* 04/18/2015   HCT 23.9* 04/18/2015   PLT 334 04/18/2015   GLUCOSE 97 05/04/2015   CHOL  04/19/2009    190        ATP III CLASSIFICATION:  <200     mg/dL   Desirable  200-239  mg/dL   Borderline High  >=240    mg/dL   High        SLIGHT HEMOLYSIS   TRIG 116 SLIGHT HEMOLYSIS 04/19/2009   HDL 36 SLIGHT HEMOLYSIS* 04/19/2009    LDLCALC * 04/19/2009    131        Total Cholesterol/HDL:CHD Risk Coronary Heart Disease Risk Table                     Men   Women  1/2 Average Risk   3.4   3.3  Average Risk       5.0   4.4  2 X Average Risk   9.6   7.1  3 X Average Risk  23.4  11.0        Use the calculated Patient Ratio above and the CHD Risk Table to determine the patient's CHD Risk.        ATP III CLASSIFICATION (LDL):  <100     mg/dL   Optimal  100-129  mg/dL   Near or Above                    Optimal  130-159  mg/dL   Borderline  160-189  mg/dL   High  >190     mg/dL   Very High   ALT 10* 04/18/2015   AST 17 04/18/2015   NA 139 05/04/2015   K 4.1 05/04/2015   CL 101 05/04/2015   CREATININE 0.50 05/04/2015   BUN 12 05/04/2015   CO2 27 05/04/2015   TSH 1.539 Test methodology is 3rd generation TSH 04/19/2009   INR 1.15 04/18/2015      Assessment / Plan:   chest xray looks good , chest tube removed in the office last week   fPlan return  Week of Nov14 with chest xray and removal of bronchial valves   Patient will return to see Dr Bobby Rumpf to consider further post surgical treatment after resection of right upper lobe Discussed with patient decreasing po pain meds, have reduced dose and given Lung cancer-right upper lobe   Staging form: Lung, AJCC 7th Edition     Clinical: T2, N2, M0 - Signed by Grace Isaac, MD on 05/11/2015     Pathologic stage from 03/29/2015: Stage IIA (yT1b, N1, cM0) - Signed by Grace Isaac, MD on 05/11/2015     Grace Isaac MD      [address] [place] [address] Office [phone]   Beeper [phone]  05/16/2015 4:19 PM

## 2015-05-17 ENCOUNTER — Encounter: Payer: Medicaid Other | Admitting: Cardiothoracic Surgery

## 2015-05-18 ENCOUNTER — Other Ambulatory Visit: Payer: Self-pay | Admitting: *Deleted

## 2015-05-18 DIAGNOSIS — J939 Pneumothorax, unspecified: Secondary | ICD-10-CM

## 2015-05-24 ENCOUNTER — Other Ambulatory Visit: Payer: Self-pay

## 2015-05-24 ENCOUNTER — Ambulatory Visit: Payer: Medicaid Other | Admitting: Cardiothoracic Surgery

## 2015-05-24 DIAGNOSIS — R0782 Intercostal pain: Secondary | ICD-10-CM

## 2015-05-26 ENCOUNTER — Other Ambulatory Visit: Payer: Self-pay | Admitting: *Deleted

## 2015-05-26 ENCOUNTER — Ambulatory Visit (INDEPENDENT_AMBULATORY_CARE_PROVIDER_SITE_OTHER): Payer: Self-pay | Admitting: Physician Assistant

## 2015-05-26 ENCOUNTER — Encounter: Payer: Self-pay | Admitting: Physician Assistant

## 2015-05-26 ENCOUNTER — Ambulatory Visit
Admission: RE | Admit: 2015-05-26 | Discharge: 2015-05-26 | Disposition: A | Discharge status: Home / Self Care | Payer: Medicaid Other | Source: Ambulatory Visit | Attending: Cardiothoracic Surgery | Admitting: Cardiothoracic Surgery

## 2015-05-26 VITALS — BP 108/79 | HR 119 | Resp 16 | Ht 64.0 in | Wt 97.5 lb

## 2015-05-26 DIAGNOSIS — Z902 Acquired absence of lung [part of]: Secondary | ICD-10-CM

## 2015-05-26 DIAGNOSIS — R0782 Intercostal pain: Secondary | ICD-10-CM

## 2015-05-26 DIAGNOSIS — R079 Chest pain, unspecified: Secondary | ICD-10-CM

## 2015-05-26 DIAGNOSIS — C3411 Malignant neoplasm of upper lobe, right bronchus or lung: Secondary | ICD-10-CM

## 2015-05-26 MED ORDER — GABAPENTIN 300 MG PO CAPS: 300.0000 mg | ORAL_CAPSULE | Freq: Two times a day (BID) | ORAL | Status: DC

## 2015-05-26 NOTE — Progress Notes (Signed)
HPI: This is a 54 year old Caucasian female who is most recently s/p video bronchoscopy and insertion of bronchial valves for a persistent air leak. She has a history of stage IIA right lung cancer. She was last seen in the office on 05/16/2015. At that time, Dr. Servando Snare discussed decreasing pain medication. Her pain medication is Norco 5/325 mg one every 6 hours PRN severe pain and Ultram 50-100 mg every 6 hours PRN moderate pain. She denies increasing shortness of breath, fever, chills, or sputum production. She states she is not smoking.  Current Outpatient Prescriptions  Medication Sig Dispense Refill  . acetaminophen (TYLENOL) 325 MG tablet Take 650 mg by mouth every 6 (six) hours as needed.    Marland Kitchen albuterol (PROVENTIL HFA;VENTOLIN HFA) 108 (90 BASE) MCG/ACT inhaler Inhale 2 puffs into the lungs every 6 (six) hours as needed for wheezing or shortness of breath.    . Alirocumab (PRALUENT) 75 MG/ML SOPN Inject 1 mL into the skin every 14 (fourteen) days.    . Alirocumab 75 MG/ML SOPN Inject 1 mL into the skin every 14 (fourteen) days.     . ALPRAZolam (XANAX) 0.5 MG tablet Take 0.5 mg by mouth 3 (three) times daily as needed for anxiety.    Marland Kitchen aspirin EC 81 MG tablet Take 81 mg by mouth daily.    . clopidogrel (PLAVIX) 75 MG tablet Take 75 mg by mouth daily.    Marland Kitchen docusate sodium (COLACE) 50 MG capsule Take 50 mg by mouth 1 day or 1 dose.    Marland Kitchen Fluticasone Furoate 200 MCG/ACT AEPB Inhale 200 mcg into the lungs.    . Fluticasone Furoate-Vilanterol (BREO ELLIPTA) 200-25 MCG/INH AEPB Inhale 1 puff into the lungs daily.    . GUAIFENESIN PO Take 400 mg by mouth every 12 (twelve) hours.    Marland Kitchen HYDROcodone-acetaminophen (NORCO/VICODIN) 5-325 MG tablet Take 1 tablet by mouth every 6 (six) hours as needed for moderate pain or severe pain. 40 tablet 0  . ipratropium (ATROVENT) 0.02 % nebulizer solution Take 0.5 mg by nebulization 4 (four) times daily.    . polyethylene glycol (MIRALAX / GLYCOLAX) packet Take 17  g by mouth daily. 30 each 12  . sodium chloride (OCEAN) 0.65 % SOLN nasal spray Place 1 spray into both nostrils as needed for congestion. Nose bleeds  0  . traMADol (ULTRAM) 50 MG tablet Take 1-2 tablets (50-100 mg total) by mouth every 6 (six) hours as needed for moderate pain. 30 tablet 0  Vital Signs: BP 108/79, HR 119, RR 16, oxygen saturation 97% on room air   Physical Exam: CV-Tachycardic Pulmonary-Clear on left and slightly diminished right base. Patient is tender all along right lateral side. Wounds-Clean and dry. No signs of wound infection   Imaging Study: PA/LAT CXR:  EXAM: CHEST 2 VIEW  COMPARISON: PA and lateral chest x-ray of May 16, 2015.  FINDINGS: There is stable density in the right pulmonary apex. There is a persistent small right pleural effusion. A meniscus is visible at the right lung base and is somewhat smaller than on the previous study. No definite apical pneumothorax is observed. Interbronchial valves on the right are visible. The left lung is clear. The heart and pulmonary vascularity are normal. The mediastinum is normal in width. The bony thorax is unremarkable.  IMPRESSION: Stable appearance of the right lung with further decrease in size of the small right pleural effusion. There is no definite pneumothorax or pneumomediastinum. No acute abnormality is observed elsewhere.  Electronically Signed  By: David Martinique M.D.  On: 05/26/2015 15:04  Impression and Plan: I discussed with the patient that her right sided pain is possibly related to nerve type pain (neuritis). I gave her a prescription for Neurontin 300 mg bid. She is to continue with her narcotic prescriptions as previously ordered. She did admit to only taking Norco one at night. I told her take the Norco one every 6 hours as needed for severe pain and the Ultram one or two tablets every 6 hours as needed for moderate pain. Hopefully, Neurontin will help. I will contact her  on Monday to see how she is doing. If Neurontin does not help, I will discuss with Dr. Servando Snare if pain management clinic would be of benefit. I will also discuss with him her next follow up as she is scheduled to have IB valves removed next week.    Nani Skillern, PA-C [facility] [phone]

## 2015-05-30 ENCOUNTER — Other Ambulatory Visit: Payer: Self-pay | Admitting: *Deleted

## 2015-05-31 ENCOUNTER — Inpatient Hospital Stay (HOSPITAL_COMMUNITY): Admission: RE | Admit: 2015-05-31 | Payer: Medicaid Other | Source: Ambulatory Visit

## 2015-06-01 ENCOUNTER — Other Ambulatory Visit: Payer: Self-pay | Admitting: *Deleted

## 2015-06-01 DIAGNOSIS — J939 Pneumothorax, unspecified: Secondary | ICD-10-CM

## 2015-06-02 ENCOUNTER — Encounter (HOSPITAL_COMMUNITY): Payer: Self-pay | Admitting: *Deleted

## 2015-06-02 NOTE — Anesthesia Preprocedure Evaluation (Addendum)
Anesthesia Evaluation  Patient identified by MRN, date of birth, ID band Patient awake    Reviewed: Allergy & Precautions, NPO status , Patient's Chart, lab work & pertinent test results  Airway Mallampati: II  TM Distance: >3 FB Neck ROM: Full    Dental  (+) Partial Lower, Edentulous Upper   Pulmonary COPD, Current Smoker,    breath sounds clear to auscultation- rhonchi       Cardiovascular + CAD and + Past MI   Rhythm:Regular Rate:Normal  EF 50%   Neuro/Psych negative neurological ROS     GI/Hepatic negative GI ROS, Neg liver ROS,   Endo/Other  negative endocrine ROS  Renal/GU negative Renal ROS     Musculoskeletal   Abdominal   Peds  Hematology  (+) anemia ,   Anesthesia Other Findings   Reproductive/Obstetrics                            Lab Results  Component Value Date   WBC 5.8 04/18/2015   HGB 7.6* 04/18/2015   HCT 23.9* 04/18/2015   MCV 88.2 04/18/2015   PLT 334 04/18/2015   Lab Results  Component Value Date   CREATININE 0.50 05/04/2015   BUN 12 05/04/2015   NA 139 05/04/2015   K 4.1 05/04/2015   CL 101 05/04/2015   CO2 27 05/04/2015    Anesthesia Physical  Anesthesia Plan  ASA: III  Anesthesia Plan: General   Post-op Pain Management:    Induction: Intravenous  Airway Management Planned: Oral ETT  Additional Equipment:   Intra-op Plan:   Post-operative Plan: Extubation in OR  Informed Consent: I have reviewed the patients History and Physical, chart, labs and discussed the procedure including the risks, benefits and alternatives for the proposed anesthesia with the patient or authorized representative who has indicated his/her understanding and acceptance.   Dental advisory given  Plan Discussed with: CRNA  Anesthesia Plan Comments:        Anesthesia Quick Evaluation

## 2015-06-03 ENCOUNTER — Encounter (HOSPITAL_COMMUNITY): Payer: Self-pay | Admitting: *Deleted

## 2015-06-03 ENCOUNTER — Encounter (HOSPITAL_COMMUNITY): Admission: RE | Payer: Self-pay | Source: Ambulatory Visit

## 2015-06-03 ENCOUNTER — Ambulatory Visit (HOSPITAL_COMMUNITY): Admission: RE | Admit: 2015-06-03 | Payer: Medicaid Other | Source: Ambulatory Visit | Admitting: Cardiothoracic Surgery

## 2015-06-03 ENCOUNTER — Ambulatory Visit (HOSPITAL_COMMUNITY): Payer: Medicaid Other

## 2015-06-03 ENCOUNTER — Ambulatory Visit (HOSPITAL_COMMUNITY): Payer: Medicaid Other | Admitting: Anesthesiology

## 2015-06-03 ENCOUNTER — Ambulatory Visit (HOSPITAL_COMMUNITY)
Admission: RE | Admit: 2015-06-03 | Discharge: 2015-06-03 | Disposition: A | Discharge status: Home / Self Care | Payer: Medicaid Other | Source: Ambulatory Visit | Attending: Cardiothoracic Surgery | Admitting: Cardiothoracic Surgery

## 2015-06-03 ENCOUNTER — Encounter (HOSPITAL_COMMUNITY)
Admission: RE | Disposition: A | Discharge status: Home / Self Care | Payer: Self-pay | Source: Ambulatory Visit | Attending: Cardiothoracic Surgery

## 2015-06-03 DIAGNOSIS — Z85118 Personal history of other malignant neoplasm of bronchus and lung: Secondary | ICD-10-CM | POA: Diagnosis not present

## 2015-06-03 DIAGNOSIS — F17210 Nicotine dependence, cigarettes, uncomplicated: Secondary | ICD-10-CM | POA: Insufficient documentation

## 2015-06-03 DIAGNOSIS — E785 Hyperlipidemia, unspecified: Secondary | ICD-10-CM | POA: Insufficient documentation

## 2015-06-03 DIAGNOSIS — J9311 Primary spontaneous pneumothorax: Secondary | ICD-10-CM | POA: Diagnosis not present

## 2015-06-03 DIAGNOSIS — J449 Chronic obstructive pulmonary disease, unspecified: Secondary | ICD-10-CM | POA: Diagnosis not present

## 2015-06-03 DIAGNOSIS — Z4589 Encounter for adjustment and management of other implanted devices: Secondary | ICD-10-CM | POA: Insufficient documentation

## 2015-06-03 DIAGNOSIS — I252 Old myocardial infarction: Secondary | ICD-10-CM | POA: Insufficient documentation

## 2015-06-03 DIAGNOSIS — J939 Pneumothorax, unspecified: Secondary | ICD-10-CM

## 2015-06-03 DIAGNOSIS — I2510 Atherosclerotic heart disease of native coronary artery without angina pectoris: Secondary | ICD-10-CM | POA: Diagnosis not present

## 2015-06-03 DIAGNOSIS — Z4802 Encounter for removal of sutures: Secondary | ICD-10-CM

## 2015-06-03 HISTORY — PX: VIDEO BRONCHOSCOPY WITH INSERTION OF INTERBRONCHIAL VALVE (IBV): SHX6178

## 2015-06-03 LAB — PROTIME-INR
INR: 1.1 (ref 0.00–1.49)
Prothrombin Time: 14.4 seconds (ref 11.6–15.2)

## 2015-06-03 LAB — COMPREHENSIVE METABOLIC PANEL
ALT: 14 U/L (ref 14–54)
AST: 15 U/L (ref 15–41)
Albumin: 3.6 g/dL (ref 3.5–5.0)
Alkaline Phosphatase: 49 U/L (ref 38–126)
Anion gap: 7 (ref 5–15)
BUN: 13 mg/dL (ref 6–20)
CO2: 27 mmol/L (ref 22–32)
Calcium: 9.1 mg/dL (ref 8.9–10.3)
Chloride: 105 mmol/L (ref 101–111)
Creatinine, Ser: 0.45 mg/dL (ref 0.44–1.00)
GFR calc Af Amer: >60 mL/min (ref >60)
GFR calc non Af Amer: >60 mL/min (ref >60)
Glucose, Bld: 88 mg/dL (ref 65–99)
Potassium: 4.3 mmol/L (ref 3.5–5.1)
Sodium: 139 mmol/L (ref 135–145)
Total Bilirubin: 0.4 mg/dL (ref 0.3–1.2)
Total Protein: 5.7 g/dL — ABNORMAL LOW (ref 6.5–8.1)

## 2015-06-03 LAB — CBC
HCT: 23.8 % — ABNORMAL LOW (ref 36.0–46.0)
Hemoglobin: 7.3 g/dL — ABNORMAL LOW (ref 12.0–15.0)
MCH: 22.7 pg — ABNORMAL LOW (ref 26.0–34.0)
MCHC: 30.7 g/dL (ref 30.0–36.0)
MCV: 73.9 fL — ABNORMAL LOW (ref 78.0–100.0)
Platelets: 364 10*3/uL (ref 150–400)
RBC: 3.22 MIL/uL — ABNORMAL LOW (ref 3.87–5.11)
RDW: 18.2 % — ABNORMAL HIGH (ref 11.5–15.5)
WBC: 4.4 10*3/uL (ref 4.0–10.5)

## 2015-06-03 LAB — APTT: aPTT: 28 seconds (ref 24–37)

## 2015-06-03 SURGERY — BRONCHOSCOPY, FLEXIBLE, WITH INTRABRONCHIAL VALVE INSERTION
Anesthesia: General

## 2015-06-03 MED ORDER — EPHEDRINE SULFATE 50 MG/ML IJ SOLN
INTRAMUSCULAR | Status: AC
  Filled 2015-06-03: qty 1

## 2015-06-03 MED ORDER — GLYCOPYRROLATE 0.2 MG/ML IJ SOLN
INTRAMUSCULAR | Status: AC
  Filled 2015-06-03: qty 2

## 2015-06-03 MED ORDER — ONDANSETRON HCL 4 MG/2ML IJ SOLN
INTRAMUSCULAR | Status: AC
  Filled 2015-06-03: qty 2

## 2015-06-03 MED ORDER — EPINEPHRINE HCL 1 MG/ML IJ SOLN
INTRAMUSCULAR | Status: AC
  Filled 2015-06-03: qty 1

## 2015-06-03 MED ORDER — PROMETHAZINE HCL 25 MG/ML IJ SOLN: 6.2500 mg | INTRAMUSCULAR | Status: DC | PRN

## 2015-06-03 MED ORDER — NEOSTIGMINE METHYLSULFATE 10 MG/10ML IV SOLN
INTRAVENOUS | Status: DC | PRN
  Administered 2015-06-03: 3 mg via INTRAVENOUS

## 2015-06-03 MED ORDER — PHENYLEPHRINE 40 MCG/ML (10ML) SYRINGE FOR IV PUSH (FOR BLOOD PRESSURE SUPPORT)
PREFILLED_SYRINGE | INTRAVENOUS | Status: AC
  Filled 2015-06-03: qty 10

## 2015-06-03 MED ORDER — LACTATED RINGERS IV SOLN
INTRAVENOUS | Status: DC | PRN
  Administered 2015-06-03: 07:00:00 via INTRAVENOUS

## 2015-06-03 MED ORDER — HYDROCODONE-ACETAMINOPHEN 7.5-325 MG PO TABS
1.0000 | ORAL_TABLET | Freq: Once | ORAL | Status: DC | PRN
Start: 2015-06-03 — End: 2015-06-03

## 2015-06-03 MED ORDER — PHENYLEPHRINE HCL 10 MG/ML IJ SOLN
INTRAMUSCULAR | Status: DC | PRN
  Administered 2015-06-03: 80 ug via INTRAVENOUS

## 2015-06-03 MED ORDER — LIDOCAINE HCL (CARDIAC) 20 MG/ML IV SOLN
INTRAVENOUS | Status: DC | PRN
  Administered 2015-06-03: 50 mg via INTRAVENOUS

## 2015-06-03 MED ORDER — ROCURONIUM BROMIDE 50 MG/5ML IV SOLN
INTRAVENOUS | Status: AC
  Filled 2015-06-03: qty 2

## 2015-06-03 MED ORDER — GLYCOPYRROLATE 0.2 MG/ML IJ SOLN
INTRAMUSCULAR | Status: DC | PRN
  Administered 2015-06-03: .4 mg via INTRAVENOUS

## 2015-06-03 MED ORDER — NEOSTIGMINE METHYLSULFATE 10 MG/10ML IV SOLN
INTRAVENOUS | Status: AC
  Filled 2015-06-03: qty 6

## 2015-06-03 MED ORDER — FENTANYL CITRATE (PF) 100 MCG/2ML IJ SOLN
INTRAMUSCULAR | Status: DC | PRN
  Administered 2015-06-03: 100 ug via INTRAVENOUS

## 2015-06-03 MED ORDER — PROPOFOL 10 MG/ML IV BOLUS
INTRAVENOUS | Status: DC | PRN
  Administered 2015-06-03: 140 mg via INTRAVENOUS

## 2015-06-03 MED ORDER — FENTANYL CITRATE (PF) 250 MCG/5ML IJ SOLN
INTRAMUSCULAR | Status: AC
  Filled 2015-06-03: qty 5

## 2015-06-03 MED ORDER — LIDOCAINE HCL (CARDIAC) 20 MG/ML IV SOLN
INTRAVENOUS | Status: AC
  Filled 2015-06-03: qty 15

## 2015-06-03 MED ORDER — LIDOCAINE HCL 4 % MT SOLN
OROMUCOSAL | Status: DC | PRN
  Administered 2015-06-03: 4 mL via TOPICAL

## 2015-06-03 MED ORDER — FENTANYL CITRATE (PF) 100 MCG/2ML IJ SOLN: 25.0000 ug | INTRAMUSCULAR | Status: DC | PRN

## 2015-06-03 MED ORDER — MIDAZOLAM HCL 2 MG/2ML IJ SOLN
INTRAMUSCULAR | Status: AC
  Filled 2015-06-03: qty 2

## 2015-06-03 MED ORDER — DEXTROSE 5 % IV SOLN
10.0000 mg | INTRAVENOUS | Status: DC | PRN
  Administered 2015-06-03: 20 ug/min via INTRAVENOUS

## 2015-06-03 MED ORDER — PROPOFOL 10 MG/ML IV BOLUS
INTRAVENOUS | Status: AC
  Filled 2015-06-03: qty 20

## 2015-06-03 MED ORDER — ONDANSETRON HCL 4 MG/2ML IJ SOLN
INTRAMUSCULAR | Status: DC | PRN
  Administered 2015-06-03: 4 mg via INTRAVENOUS

## 2015-06-03 MED ORDER — SUCCINYLCHOLINE CHLORIDE 20 MG/ML IJ SOLN
INTRAMUSCULAR | Status: AC
  Filled 2015-06-03: qty 1

## 2015-06-03 MED ORDER — ROCURONIUM BROMIDE 100 MG/10ML IV SOLN
INTRAVENOUS | Status: DC | PRN
  Administered 2015-06-03: 30 mg via INTRAVENOUS

## 2015-06-03 MED ORDER — MIDAZOLAM HCL 5 MG/5ML IJ SOLN
INTRAMUSCULAR | Status: DC | PRN
  Administered 2015-06-03: 2 mg via INTRAVENOUS

## 2015-06-03 MED ORDER — SODIUM CHLORIDE 0.9 % IJ SOLN
INTRAMUSCULAR | Status: AC
  Filled 2015-06-03: qty 10

## 2015-06-03 SURGICAL SUPPLY — 27 items
CANISTER SUCTION 2500CC (MISCELLANEOUS) ×2 IMPLANT
CATH EMB 4FR 80CM (CATHETERS) IMPLANT
CATH EMB 5FR 80CM (CATHETERS) IMPLANT
CONT SPEC 4OZ CLIKSEAL STRL BL (MISCELLANEOUS) ×2 IMPLANT
COVER TABLE BACK 60X90 (DRAPES) ×2 IMPLANT
DRSG AQUACEL AG ADV 3.5X14 (GAUZE/BANDAGES/DRESSINGS) ×2 IMPLANT
FILTER STRAW FLUID ASPIR (MISCELLANEOUS) IMPLANT
FORCEPS BIOP RJ4 1.8 (CUTTING FORCEPS) IMPLANT
FORCEPS RADIAL JAW LRG 4 PULM (INSTRUMENTS) ×1 IMPLANT
GAUZE SPONGE 4X4 12PLY STRL (GAUZE/BANDAGES/DRESSINGS) ×2 IMPLANT
GLOVE BIO SURGEON STRL SZ 6.5 (GLOVE) ×2 IMPLANT
GOWN STRL REUS W/ TWL LRG LVL3 (GOWN DISPOSABLE) ×2 IMPLANT
GOWN STRL REUS W/TWL LRG LVL3 (GOWN DISPOSABLE) ×2
KIT CLEAN ENDO COMPLIANCE (KITS) ×2 IMPLANT
KIT ROOM TURNOVER OR (KITS) ×2 IMPLANT
MARKER SKIN DUAL TIP RULER LAB (MISCELLANEOUS) ×2 IMPLANT
NS IRRIG 1000ML POUR BTL (IV SOLUTION) ×2 IMPLANT
OIL SILICONE PENTAX (PARTS (SERVICE/REPAIRS)) IMPLANT
PAD ARMBOARD 7.5X6 YLW CONV (MISCELLANEOUS) ×4 IMPLANT
RADIAL JAW LRG 4 PULMONARY (INSTRUMENTS) ×1
STOPCOCK MORSE 400PSI 3WAY (MISCELLANEOUS) ×2 IMPLANT
SYR 20ML ECCENTRIC (SYRINGE) ×2 IMPLANT
SYR 5ML LL (SYRINGE) ×2 IMPLANT
SYRINGE 10CC LL (SYRINGE) ×2 IMPLANT
TOWEL NATURAL 4PK STERILE (DISPOSABLE) ×2 IMPLANT
TRAP SPECIMEN MUCOUS 40CC (MISCELLANEOUS) ×2 IMPLANT
TUBE CONNECTING 20X1/4 (TUBING) ×2 IMPLANT

## 2015-06-03 NOTE — Anesthesia Procedure Notes (Signed)
Procedure Name: Intubation Date/Time: 06/03/2015 7:45 AM Performed by: Mariea Clonts Pre-anesthesia Checklist: Patient identified, Timeout performed, Emergency Drugs available, Suction available and Patient being monitored Patient Re-evaluated:Patient Re-evaluated prior to inductionOxygen Delivery Method: Circle system utilized Preoxygenation: Pre-oxygenation with 100% oxygen Intubation Type: IV induction Ventilation: Mask ventilation without difficulty and Oral airway inserted - appropriate to patient size Laryngoscope Size: Sabra Heck and 2 Grade View: Grade I Tube type: Oral Tube size: 8.5 mm Number of attempts: 1 Airway Equipment and Method: LTA kit utilized Placement Confirmation: ETT inserted through vocal cords under direct vision,  breath sounds checked- equal and bilateral and positive ETCO2 Tube secured with: Tape Dental Injury: Teeth and Oropharynx as per pre-operative assessment

## 2015-06-03 NOTE — Anesthesia Postprocedure Evaluation (Signed)
  Anesthesia Post-op Note  Patient: Michelle King  Procedure(s) Performed: Procedure(s): VIDEO BRONCHOSCOPY WITH REMOVAL OF INTERBRONCHIAL VALVE (IBV) (N/A)  Patient Location: PACU  Anesthesia Type:General  Level of Consciousness: awake and alert   Airway and Oxygen Therapy: Patient Spontanous Breathing  Post-op Pain: none  Post-op Assessment: Post-op Vital signs reviewed              Post-op Vital Signs: Reviewed  Last Vitals:  Filed Vitals:   06/03/15 1015  BP: 90/67  Pulse: 80  Temp:   Resp: 18    Complications: No apparent anesthesia complications

## 2015-06-03 NOTE — Transfer of Care (Signed)
Immediate Anesthesia Transfer of Care Note  Patient: Michelle King  Procedure(s) Performed: Procedure(s): VIDEO BRONCHOSCOPY WITH REMOVAL OF INTERBRONCHIAL VALVE (IBV) (N/A)  Patient Location: PACU  Anesthesia Type:General  Level of Consciousness: awake, alert  and oriented  Airway & Oxygen Therapy: Patient Spontanous Breathing and Patient connected to nasal cannula oxygen  Post-op Assessment: Report given to RN, Post -op Vital signs reviewed and stable and Patient moving all extremities X 4  Post vital signs: Reviewed and stable  Last Vitals:  Filed Vitals:   06/03/15 0845  BP: 92/68  Pulse: 78  Temp:   Resp: 20    Complications: No apparent anesthesia complications

## 2015-06-03 NOTE — Discharge Instructions (Signed)
Flexible Bronchoscopy, Care After Refer to this sheet in the next few weeks. These instructions provide you with information on caring for yourself after your procedure. Your health care provider may also give you more specific instructions. Your treatment has been planned according to current medical practices, but problems sometimes occur. Call your health care provider if you have any problems or questions after your procedure.  WHAT TO EXPECT AFTER THE PROCEDURE It is normal to have the following symptoms for 24-48 hours after the procedure:   Increased cough.  Low-grade fever.  Sore throat or hoarse voice.  Small streaks of blood in your thick spit (sputum) if tissue samples were taken (biopsy). HOME CARE INSTRUCTIONS   Do not eat or drink anything for 2 hours after your procedure. Your nose and throat were numbed by medicine. If you try to eat or drink before the medicine wears off, food or drink could go into your lungs or you could burn yourself. After the numbness is gone and your cough and gag reflexes have returned, you may eat soft food and drink liquids slowly.   The day after the procedure, you can go back to your normal diet.   You may resume normal activities.   Keep all follow-up visits as directed by your health care provider. It is important to keep all your appointments, especially if tissue samples were taken for testing (biopsy). SEEK IMMEDIATE MEDICAL CARE IF:   You have increasing shortness of breath.   You become light-headed or faint.   You have chest pain.   You have any new concerning symptoms.  You cough up more than a small amount of blood.  The amount of blood you cough up increases. MAKE SURE YOU:  Understand these instructions.  Will watch your condition.  Will get help right away if you are not doing well or get worse.   This information is not intended to replace advice given to you by your health care provider. Make sure you discuss  any questions you have with your health care provider.   Document Released: 01/19/2005 Document Revised: 07/23/2014 Document Reviewed: 03/06/2013 Elsevier Interactive Patient Education [employer].

## 2015-06-03 NOTE — Brief Op Note (Signed)
      [address]       [place] [address]             [phone]      06/03/2015  8:29 AM  PATIENT:  Michelle King  54 y.o. female  PRE-OPERATIVE DIAGNOSIS:  History of right upper lobectomy and placement of IBV valves  POST-OPERATIVE DIAGNOSIS:  History of right upper lobectomy and placement of IBV valves  PROCEDURE:  Procedure(s): VIDEO BRONCHOSCOPY WITH REMOVAL OF INTERBRONCHIAL VALVE (IBV) (N/A) x 4  SURGEON:  Surgeon(s) and Role:    * Grace Isaac, MD - Primary   ANESTHESIA:   general  EBL:    none  BLOOD ADMINISTERED:none  DRAINS: none   LOCAL MEDICATIONS USED:  NONE  SPECIMEN:  Source of Specimen:  4 IBV valves for gross only from right lower lobe  DISPOSITION OF SPECIMEN:  PATHOLOGY  COUNTS:  YES   DICTATION: .Dragon Dictation  PLAN OF CARE: Discharge to home after PACU  PATIENT DISPOSITION:  PACU - hemodynamically stable.   Delay start of Pharmacological VTE agent (>24hrs) due to surgical blood loss or risk of bleeding: on plavix

## 2015-06-03 NOTE — H&P (Signed)
[address]       [place] [address]             [phone]        Kristyn Bevens [place] Medical Record #[id number] Date of Birth: 1961-05-09  Referring: No ref. provider found Primary Care: Charlotte Sanes, MD  Chief Complaint:   For removal of bronchial valves   History of Present Illness:     S/p right upper lobe resection with prolonged air leak, resolved now and chest tube has been out 4 weeks  Still dysathesthia over right chest wall, wounds well healed   Current Activity/ Functional Status: Patient is independent with mobility/ambulation, transfers, ADL's, IADL's.   Zubrod Score: At the time of surgery this patient's most appropriate activity status/level should be described as: '[]'$     0    Normal activity, no symptoms '[x]'$     1    Restricted in physical strenuous activity but ambulatory, able to do out light work '[]'$     2    Ambulatory and capable of self care, unable to do work activities, up and about                 more than 50%  Of the time                            '[]'$     3    Only limited self care, in bed greater than 50% of waking hours '[]'$     4    Completely disabled, no self care, confined to bed or chair '[]'$     5    Moribund  Past Medical History  Diagnosis Date  . COPD (chronic obstructive pulmonary disease) ([facility])   . Hyperlipidemia   . S/P hysterectomy   . Complication of anesthesia   . PONV (postoperative nausea and vomiting)   . CAD (coronary artery disease)   . Myocardial infarction ([facility]) 2010  . Shortness of breath dyspnea   . Pneumonia     hx  . Cancer ([facility])     lung    Past Surgical History  Procedure Laterality Date  . Appendectomy    . Tubal ligation    . Mouth surgery      teeth  . Cardiac catheterization      stents  . Video bronchoscopy N/A 03/25/2015    Procedure: VIDEO BRONCHOSCOPY;  Surgeon: Grace Isaac, MD;  Location: [facility];  Service: Thoracic;  Laterality: N/A;  . Video assisted thoracoscopy  (vats)/wedge resection Right 03/25/2015    Procedure: VIDEO ASSISTED THORACOSCOPY (VATS)/LUNG RESECTION;  Surgeon: Grace Isaac, MD;  Location: [facility];  Service: Thoracic;  Laterality: Right;  . Video bronchoscopy with insertion of interbronchial valve (ibv) N/A 04/12/2015    Procedure: VIDEO BRONCHOSCOPY WITH INSERTION OF 7MM INTERBRONCHIAL VALVES (IBV) IN SUPERIOR SEGMENT, MEDIAL AND POSTERIOR SEGMENT, AND LATERAL SEGMENT OF RIGHT LOWER LOBE;  Surgeon: Grace Isaac, MD;  Location: [facility];  Service: Thoracic;  Laterality: N/A;  75m Spiration Valve placed in Superior Segment, Medial and Posterior Segment, and Lateral Segment of Right Lower Lobe  . Colonoscopy N/A 04/18/2015    Procedure: COLONOSCOPY;  Surgeon: MLadene Artist MD;  [facility];  Service: Endoscopy;  Laterality: N/A;  . Video bronchoscopy with insertion of interbronchial valve (ibv) Right 04/19/2015    Procedure: VIDEO BRONCHOSCOPY WITH INSERTION OF INTERBRONCHIAL VALVE (IBV);  Surgeon: ELilia Argue  Servando Snare, MD;  Location: [facility];  Service: Thoracic;  Laterality: Right;    History  Smoking status  . Current Every Day Smoker -- 0.50 packs/day for 40 years  . Types: Cigarettes  Smokeless tobacco  . Never Used    Comment: down to 4 aday    History  Alcohol Use No    Social History   Social History  . Marital Status: Married    Spouse Name: N/A  . Number of Children: N/A  . Years of Education: N/A   Occupational History  . Not on file.   Social History Main Topics  . Smoking status: Current Every Day Smoker -- 0.50 packs/day for 40 years    Types: Cigarettes  . Smokeless tobacco: Never Used     Comment: down to 4 aday  . Alcohol Use: No  . Drug Use: No  . Sexual Activity: Not on file   Other Topics Concern  . Not on file   Social History Narrative    Allergies  Allergen Reactions  . Codeine Itching and Swelling    Facial swelling Tolerates norco  . Rosuvastatin Other (See Comments)    Nose  bleeds  . Sympathomimetics Other (See Comments)    Pt was not told what the reaction was    No current facility-administered medications for this encounter.   Facility-Administered Medications Ordered in Other Encounters  Medication Dose Route Frequency Provider Last Rate Last Dose  . lactated ringers infusion    Continuous PRN Mariea Clonts, CRNA        Prescriptions prior to admission  Medication Sig Dispense Refill Last Dose  . albuterol (PROVENTIL HFA;VENTOLIN HFA) 108 (90 BASE) MCG/ACT inhaler Inhale 2 puffs into the lungs every 6 (six) hours as needed for wheezing or shortness of breath.   06/02/2015 at Unknown time  . Alirocumab (PRALUENT) 75 MG/ML SOPN Inject 1 mL into the skin every 14 (fourteen) days. Last injection 05/16/15   06/01/2015  . ALPRAZolam (XANAX) 0.5 MG tablet Take 0.5 mg by mouth 3 (three) times daily as needed for anxiety.   Past Week at Unknown time  . aspirin EC 81 MG tablet Take 81 mg by mouth daily.   06/02/2015 at Unknown time  . benzonatate (TESSALON) 100 MG capsule Take 100 mg by mouth 3 (three) times daily.   06/02/2015 at Unknown time  . clopidogrel (PLAVIX) 75 MG tablet Take 75 mg by mouth daily.   06/02/2015 at Unknown time  . docusate sodium (COLACE) 100 MG capsule Take 100 mg by mouth at bedtime.   Past Week at Unknown time  . Fluticasone Furoate-Vilanterol (BREO ELLIPTA) 200-25 MCG/INH AEPB Inhale 1 puff into the lungs daily.   06/02/2015 at Unknown time  . gabapentin (NEURONTIN) 300 MG capsule Take 1 capsule (300 mg total) by mouth 2 (two) times daily. Take one cap by mouth at bedtime tonight 05/26/2015; then take one cap by mouth two times daily thereafter. (Patient taking differently: Take 300 mg by mouth 2 (two) times daily. ) 60 capsule 1 06/02/2015 at Unknown time  . HYDROcodone-acetaminophen (NORCO/VICODIN) 5-325 MG tablet Take 1 tablet by mouth every 6 (six) hours as needed for moderate pain or severe pain. 40 tablet 0 06/02/2015 at Unknown  time  . ipratropium-albuterol (DUONEB) 0.5-2.5 (3) MG/3ML SOLN Take 3 mLs by nebulization 4 (four) times daily.   06/02/2015 at Unknown time  . traMADol (ULTRAM) 50 MG tablet Take 1-2 tablets (50-100 mg total) by mouth every 6 (six)  hours as needed for moderate pain. (Patient taking differently: Take 50 mg by mouth every 6 (six) hours as needed for moderate pain. ) 30 tablet 0 Past Month at Unknown time    Family History  Problem Relation Age of Onset  . Diabetes Mother   . Cancer Father     colon  . Stroke Sister   . Cancer Brother     colon cancer  . Stroke Sister   . Cancer Sister     lung cancer  . Heart disease Brother   . Cerebrovascular Disease Brother   . Heart disease Brother   . Cerebrovascular Disease Brother      Review of Systems:      Cardiac Review of Systems: Y or N  Chest Pain [ y   ]  Resting SOB [ n  ] Exertional SOB  Blue.Reese  ]  Orthopnea [  ]   Pedal Edema [   ]    Palpitations [  ] Syncope  [  ]   Presyncope [   ]  General Review of Systems: [Y] = yes [  ]=no Constitional: recent weight change [  ]; anorexia [  ]; fatigue [  ]; nausea [  ]; night sweats [  ]; fever [  ]; or chills [  ]                                                               Dental: poor dentition[  ]; Last Dentist visit:   Eye : blurred vision [  ]; diplopia [   ]; vision changes [  ];  Amaurosis fugax[  ]; Resp: cough [  ];  wheezing[  ];  hemoptysis[  ]; shortness of breath[  ]; paroxysmal nocturnal dyspnea[  ]; dyspnea on exertion[  ]; or orthopnea[  ];  GI:  gallstones[  ], vomiting[  ];  dysphagia[  ]; melena[  ];  hematochezia [  ]; heartburn[  ];   Hx of  Colonoscopy[  ]; GU: kidney stones [  ]; hematuria[  ];   dysuria [  ];  nocturia[  ];  history of     obstruction [  ]; urinary frequency [  ]             Skin: rash, swelling[  ];, hair loss[  ];  peripheral edema[  ];  or itching[  ]; Musculosketetal: myalgias[  ];  joint swelling[  ];  joint erythema[  ];  joint pain[  ];  back  pain[  ];  Heme/Lymph: bruising[  ];  bleeding[  ];  anemia[  ];  Neuro: TIA[  ];  headaches[  ];  stroke[  ];  vertigo[  ];  seizures[  ];   paresthesias[  ];  difficulty walking[  ];  Psych:depression[  ]; anxiety[  ];  Endocrine: diabetes[  ];  thyroid dysfunction[  ];  Immunizations: Flu [  ]; Pneumococcal[  ];  Other:  Physical Exam: BP 128/67 mmHg  Pulse 65  Temp(Src) 98.2 F (36.8 C) (Oral)  Resp 16  Ht '5\' 4"'$  (1.626 m)  Wt 97 lb (43.999 kg)  BMI 16.64 kg/m2  SpO2 100%   General appearance: alert and cooperative Head: Normocephalic, without obvious abnormality, atraumatic Neck:  no adenopathy, no carotid bruit, no JVD, supple, symmetrical, trachea midline and thyroid not enlarged, symmetric, no tenderness/mass/nodules Lymph nodes: Cervical, supraclavicular, and axillary nodes normal. Resp: clear to auscultation bilaterally Back: symmetric, no curvature. ROM normal. No CVA tenderness. Cardio: regular rate and rhythm, S1, S2 normal, no murmur, click, rub or gallop GI: soft, non-tender; bowel sounds normal; no masses,  no organomegaly Extremities: extremities normal, atraumatic, no cyanosis or edema and Homans sign is negative, no sign of DVT Neurologic: Grossly normal  Diagnostic Studies & Laboratory data:     Recent Radiology Findings:   Dg Chest 2 View  06/03/2015  CLINICAL DATA:  Preop today for lung surgery. EXAM: CHEST  2 VIEW COMPARISON:  Most recent chest radiograph 05/26/2015, chest CT 04/17/2015 FINDINGS: Postsurgical change in the right hemithorax with volume loss. No definite pneumothorax. Suspect small right pleural effusion. Cardiomediastinal contours are unchanged. No consolidation or pulmonary edema. IMPRESSION: Postsurgical change in the right hemithorax with volume loss. No definite pneumothorax. Probable small persistent right pleural effusion. Electronically Signed   By: Jeb Levering M.D.   On: 06/03/2015 06:58     I have independently reviewed the  above radiologic studies.  Recent Lab Findings: Lab Results  Component Value Date   WBC 5.8 04/18/2015   HGB 7.6* 04/18/2015   HCT 23.9* 04/18/2015   PLT 334 04/18/2015   GLUCOSE 97 05/04/2015   CHOL  04/19/2009    190        ATP III CLASSIFICATION:  <200     mg/dL   Desirable  200-239  mg/dL   Borderline High  >=240    mg/dL   High        SLIGHT HEMOLYSIS   TRIG 116 SLIGHT HEMOLYSIS 04/19/2009   HDL 36 SLIGHT HEMOLYSIS* 04/19/2009   LDLCALC * 04/19/2009    131        Total Cholesterol/HDL:CHD Risk Coronary Heart Disease Risk Table                     Men   Women  1/2 Average Risk   3.4   3.3  Average Risk       5.0   4.4  2 X Average Risk   9.6   7.1  3 X Average Risk  23.4   11.0        Use the calculated Patient Ratio above and the CHD Risk Table to determine the patient's CHD Risk.        ATP III CLASSIFICATION (LDL):  <100     mg/dL   Optimal  100-129  mg/dL   Near or Above                    Optimal  130-159  mg/dL   Borderline  160-189  mg/dL   High  >190     mg/dL   Very High   ALT 10* 04/18/2015   AST 17 04/18/2015   NA 139 05/04/2015   K 4.1 05/04/2015   CL 101 05/04/2015   CREATININE 0.50 05/04/2015   BUN 12 05/04/2015   CO2 27 05/04/2015   TSH 1.539 Test methodology is 3rd generation TSH 04/19/2009   INR 1.15 04/18/2015      Assessment / Plan:     For removel of IBV valves (4 total)  The goals risks and alternatives of the planned surgical procedure bronchoscopy and removal of IBV valves  have been discussed with the patient in detail. The  risks of the procedure including death, infection, stroke, myocardial infarction,respiratory failure  bleeding, blood transfusion have all been discussed specifically.  I have quoted Ned Card a less then 1% % of perioperative mortality and a complication rate as high as 15 %. The patient's questions have been answered.Jereline Ticer is willing  to proceed with the planned procedure.     Grace Isaac MD      [address] [place] [address] Office [phone]   Beeper [phone]  06/03/2015 7:32 AM

## 2015-06-03 NOTE — Progress Notes (Signed)
Going to short stay for 2 view then to phase 2

## 2015-06-06 ENCOUNTER — Encounter (HOSPITAL_COMMUNITY): Payer: Self-pay | Admitting: Cardiothoracic Surgery

## 2015-06-07 NOTE — Op Note (Signed)
NAMECODI, FOLKERTS NO.:  [id number]  MEDICAL RECORD NO.:  [id number]  LOCATION:  [facility]                         FACILITY:  [facility]  PHYSICIAN:  Lanelle Bal, MD    DATE OF BIRTH:  03/17/1961  DATE OF PROCEDURE:  06/03/2015 DATE OF DISCHARGE:  06/03/2015                              OPERATIVE REPORT   PREOPERATIVE DIAGNOSIS:  History of right upper lobectomy and placement of IBD valves for persistent postoperative air leak.  POSTOPERATIVE DIAGNOSIS:  History of right upper lobectomy and placement of IBD valves for persistent postoperative air leak.  SURGICAL PROCEDURE:  Video bronchoscopy with removal of intrabronchial valves x4.  SURGEON:  Lanelle Bal, MD.  BRIEF HISTORY:  The patient is a 54 year old female, who 7 weeks previously underwent placement of IBD valves because of a persistent postoperative air leak.  At that time, she had a total of 4 valves placed in the right lower lobe.  Subsequently, she was able to get her chest tube out and has been followed in the office without evidence of recurrent pneumothorax and with a stable chest x-ray and then with the chest tube out.  She comes in today for elective removal of the IBD valves.  Risks and options were discussed with the patient, agreed and signed informed consent.  DESCRIPTION OF PROCEDURE:  The patient underwent general endotracheal anesthesia without incident.  Single-lumen endotracheal tube was placed. Appropriate time-out was performed.  We then proceeded with removal of the IBD valves starting in the lower lobe segments where the 3 valves were easily removed.  The fourth valve in the superior segment of the right lower lobe was more angulated and it settled slightly, but with a little more time, we were able to easily remove this one also.  With all 4 valves removed and the airways intact and with no bleeding, the scope was removed.  The patient was awakened and extubated in the  operating room and transferred to the recovery room.     Lanelle Bal, MD     EG/MEDQ  D:  06/07/2015  T:  06/07/2015  Job:  [id number]

## 2015-06-20 DIAGNOSIS — R5383 Other fatigue: Secondary | ICD-10-CM | POA: Diagnosis not present

## 2015-06-20 DIAGNOSIS — C3411 Malignant neoplasm of upper lobe, right bronchus or lung: Secondary | ICD-10-CM | POA: Diagnosis not present

## 2015-06-20 DIAGNOSIS — R0789 Other chest pain: Secondary | ICD-10-CM | POA: Diagnosis not present

## 2015-07-25 ENCOUNTER — Ambulatory Visit (INDEPENDENT_AMBULATORY_CARE_PROVIDER_SITE_OTHER): Payer: Medicaid Other | Admitting: Surgical

## 2015-07-25 VITALS — BP 105/69 | HR 72 | Resp 20 | Ht 64.0 in | Wt 98.0 lb

## 2015-07-25 DIAGNOSIS — Z902 Acquired absence of lung [part of]: Secondary | ICD-10-CM

## 2015-07-25 DIAGNOSIS — J9382 Other air leak: Secondary | ICD-10-CM

## 2015-07-25 DIAGNOSIS — IMO0002 Reserved for concepts with insufficient information to code with codable children: Secondary | ICD-10-CM

## 2015-07-25 DIAGNOSIS — C3411 Malignant neoplasm of upper lobe, right bronchus or lung: Secondary | ICD-10-CM

## 2015-07-25 NOTE — Progress Notes (Signed)
[address]       [place] [address]             [phone]                  Michelle King Record #[id number] Date of Birth: 01/21/1961  Referring SU:PJSRP, Chari Manning, MD Primary Cardiology: Primary Care:SISTASIS,JIM, MD  Chief Complaint:  Follow Up Visit  Lung cancer-right upper lobe   Staging form: Lung, AJCC 7th Edition     Clinical: T2, N2, M0 - Signed by Grace Isaac, MD on 05/11/2015     Pathologic stage from 03/29/2015: Stage IIA (yT1b, N1, cM0) - Signed by Grace Isaac, MD on 05/11/2015  History of Present Illness:     Patient is seen in the office on today's date in routine follow-up. She continues to feel better clinically. She does have moderate discomforts related to the incision with some neuropathic component. She feels that the symptoms are relatively mild at this point but does notice some significantly more with such things as coughing or burping. She feels the Neurontin is given her some good relief. She denies shortness of breath. She states that she had some recent scans and is awaiting the report to determine if adjuvant chemotherapy or radiation will be her next step in management of her lung cancer.         Zubrod Score: At the time of surgery this patient's most appropriate activity status/level should be described as: '[]'$     0    Normal activity, no symptoms '[]'$     1    Restricted in physical strenuous activity but ambulatory, able to do out light work '[]'$     2    Ambulatory and capable of self care, unable to do work activities, up and about                 >50 % of waking hours                                                                                   '[]'$     3    Only limited self care, in bed greater than 50% of waking hours '[]'$     4    Completely disabled, no self care, confined to bed or chair '[]'$     5    Moribund  History  Smoking status  . Current Every Day Smoker --  0.50 packs/day for 40 years  . Types: Cigarettes  Smokeless tobacco  . Never Used    Comment: down to 4 aday       Allergies  Allergen Reactions  . Codeine Itching and Swelling    Facial swelling Tolerates norco  . Rosuvastatin Other (See Comments)    Nose bleeds  . Sympathomimetics Other (See Comments)    Pt was not told what the reaction was    Current Outpatient Prescriptions  Medication Sig Dispense Refill  . albuterol (PROVENTIL HFA;VENTOLIN HFA) 108 (90 BASE) MCG/ACT inhaler Inhale 2 puffs into the lungs every 6 (six) hours as needed for wheezing or shortness of  breath.    . Alirocumab (PRALUENT) 75 MG/ML SOPN Inject 1 mL into the skin every 14 (fourteen) days. Last injection 05/16/15    . ALPRAZolam (XANAX) 0.5 MG tablet Take 0.5 mg by mouth 3 (three) times daily as needed for anxiety.    Marland Kitchen aspirin EC 81 MG tablet Take 81 mg by mouth daily.    . benzonatate (TESSALON) 100 MG capsule Take 100 mg by mouth 3 (three) times daily.    . clopidogrel (PLAVIX) 75 MG tablet Take 75 mg by mouth daily.    Marland Kitchen docusate sodium (COLACE) 100 MG capsule Take 100 mg by mouth at bedtime.    . Fluticasone Furoate-Vilanterol (BREO ELLIPTA) 200-25 MCG/INH AEPB Inhale 1 puff into the lungs daily.    Marland Kitchen gabapentin (NEURONTIN) 300 MG capsule Take 1 capsule (300 mg total) by mouth 2 (two) times daily. Take one cap by mouth at bedtime tonight 05/26/2015; then take one cap by mouth two times daily thereafter. (Patient taking differently: Take 300 mg by mouth 2 (two) times daily. ) 60 capsule 1  . HYDROcodone-acetaminophen (NORCO/VICODIN) 5-325 MG tablet Take 1 tablet by mouth every 6 (six) hours as needed for moderate pain or severe pain. 40 tablet 0  . ipratropium-albuterol (DUONEB) 0.5-2.5 (3) MG/3ML SOLN Take 3 mLs by nebulization 4 (four) times daily.    . traMADol (ULTRAM) 50 MG tablet Take 1-2 tablets (50-100 mg total) by mouth every 6 (six) hours as needed for moderate pain. (Patient taking  differently: Take 50 mg by mouth every 6 (six) hours as needed for moderate pain. ) 30 tablet 0   No current facility-administered medications for this visit.       Physical Exam: BP 105/69 mmHg  Pulse 72  Resp 20  Ht '5\' 4"'$  (1.626 m)  Wt 98 lb (44.453 kg)  BMI 16.81 kg/m2  SpO2 98%  General appearance: alert, cooperative and no distress Heart: regular rate and rhythm Lungs: clear to auscultation bilaterally Wounds: well-healed  Diagnostic Studies & Laboratory data:         Recent Radiology Findings: No results found.    I have independently reviewed the above radiology findings and reviewed findings  with the patient.  Recent Labs:     Assessment / Plan:       overall she is doing well. There was no chest x-ray today. We discussed pain management and she will try nonsteroidal anti-inflammatories as a short-term adjunct to her pain management. We will see her again in 6 months or prior to that if there are any surgically related needs.     Michelle King E 07/25/2015 3:45 PM

## 2015-07-26 DIAGNOSIS — C3411 Malignant neoplasm of upper lobe, right bronchus or lung: Secondary | ICD-10-CM | POA: Diagnosis not present

## 2015-07-26 DIAGNOSIS — D509 Iron deficiency anemia, unspecified: Secondary | ICD-10-CM | POA: Diagnosis not present

## 2015-11-23 DIAGNOSIS — Z85118 Personal history of other malignant neoplasm of bronchus and lung: Secondary | ICD-10-CM

## 2016-01-26 ENCOUNTER — Encounter: Payer: Medicaid Other | Admitting: Cardiothoracic Surgery

## 2016-02-07 ENCOUNTER — Other Ambulatory Visit: Payer: Self-pay | Admitting: Cardiothoracic Surgery

## 2016-02-07 DIAGNOSIS — C3490 Malignant neoplasm of unspecified part of unspecified bronchus or lung: Secondary | ICD-10-CM

## 2016-02-08 ENCOUNTER — Encounter: Payer: Self-pay | Admitting: Cardiothoracic Surgery

## 2016-02-08 ENCOUNTER — Ambulatory Visit
Admission: RE | Admit: 2016-02-08 | Discharge: 2016-02-08 | Disposition: A | Discharge status: Home / Self Care | Payer: Medicaid Other | Source: Ambulatory Visit | Attending: Cardiothoracic Surgery | Admitting: Cardiothoracic Surgery

## 2016-02-08 ENCOUNTER — Ambulatory Visit (INDEPENDENT_AMBULATORY_CARE_PROVIDER_SITE_OTHER): Payer: Medicare Other | Admitting: Cardiothoracic Surgery

## 2016-02-08 VITALS — BP 110/60 | Ht 64.0 in | Wt 115.0 lb

## 2016-02-08 DIAGNOSIS — C3431 Malignant neoplasm of lower lobe, right bronchus or lung: Secondary | ICD-10-CM

## 2016-02-08 DIAGNOSIS — C3490 Malignant neoplasm of unspecified part of unspecified bronchus or lung: Secondary | ICD-10-CM

## 2016-02-08 DIAGNOSIS — C3411 Malignant neoplasm of upper lobe, right bronchus or lung: Secondary | ICD-10-CM | POA: Diagnosis not present

## 2016-02-08 DIAGNOSIS — Z902 Acquired absence of lung [part of]: Secondary | ICD-10-CM

## 2016-02-08 NOTE — Progress Notes (Signed)
[address]       [place] [address]             [phone]      Michelle King [place] Medical Record #[id number] Date of Birth: 05-Jun-1961  Referring: Marice Potter, MD Primary Care: Charlotte Sanes, MD  Chief Complaint:   POST OP FOLLOW UP  Lung cancer-right upper lobe   Staging form: Lung, AJCC 7th Edition     Clinical: No stage assigned - Unsigned     Pathologic stage from 03/29/2015: Stage IIA (yT1b, N1, cM0) - Unsigned  03/25/2015  OPERATIVE REPORT PREOPERATIVE DIAGNOSIS: Adenocarcinoma, right upper lobe, status post chemotherapy. POSTOPERATIVE DIAGNOSIS: Adenocarcinoma, right upper lobe, status post chemotherapy. SURGICAL PROCEDURE: 1. Video bronchoscopy. 2. Right video-assisted thoracoscopy with right upper lobectomy. 3. Lymph node dissection. 4. Placement of ON-Q. SURGEON: Lanelle Bal, M.D  History of Present Illness:     Patient returns to the office after being hospitalized for right upper lobectomy, her course was prolonged secondary to prolonged air leak. Air leak ultimately resolved with placing intrapulmonary valves. Patient continues to improve following surgery 8 months ago. She has gained some weight back. Most importantly she is stop smoking is been completely tobacco free since the time of her surgery.,      Past Medical History:  Diagnosis Date  . CAD (coronary artery disease)   . Cancer ([place])    lung  . Complication of anesthesia   . COPD (chronic obstructive pulmonary disease) ([place])   . Hyperlipidemia   . Myocardial infarction ([place]) 2010  . Pneumonia    hx  . PONV (postoperative nausea and vomiting)   . S/P hysterectomy   . Shortness of breath dyspnea      History  Smoking Status  . Current Every Day Smoker  . Packs/day: 0.50  . Years: 40.00  . Types: Cigarettes  Smokeless Tobacco  . Never Used    Comment: down to 4 aday    History  Alcohol Use No     Allergies  Allergen  Reactions  . Codeine Itching and Swelling    Facial swelling Tolerates norco  . Rosuvastatin Other (See Comments)    Nose bleeds  . Sympathomimetics Other (See Comments)    Pt was not told what the reaction was    Current Outpatient Prescriptions  Medication Sig Dispense Refill  . albuterol (PROVENTIL HFA;VENTOLIN HFA) 108 (90 BASE) MCG/ACT inhaler Inhale 2 puffs into the lungs every 6 (six) hours as needed for wheezing or shortness of breath.    . Alirocumab (PRALUENT) 75 MG/ML SOPN Inject 1 mL into the skin every 14 (fourteen) days. Last injection 05/16/15    . ALPRAZolam (XANAX) 0.5 MG tablet Take 0.5 mg by mouth 3 (three) times daily as needed for anxiety.    Marland Kitchen aspirin EC 81 MG tablet Take 81 mg by mouth daily.    . benzonatate (TESSALON) 100 MG capsule Take 100 mg by mouth 3 (three) times daily.    . clopidogrel (PLAVIX) 75 MG tablet Take 75 mg by mouth daily.    Marland Kitchen docusate sodium (COLACE) 100 MG capsule Take 100 mg by mouth at bedtime.    . Fluticasone Furoate-Vilanterol (BREO ELLIPTA) 200-25 MCG/INH AEPB Inhale 1 puff into the lungs daily.    Marland Kitchen gabapentin (NEURONTIN) 300 MG capsule Take 1 capsule (300 mg total) by mouth 2 (two) times daily. Take one cap by mouth at bedtime tonight 05/26/2015; then take one  cap by mouth two times daily thereafter. (Patient taking differently: Take 300 mg by mouth 2 (two) times daily. ) 60 capsule 1  . HYDROcodone-acetaminophen (NORCO/VICODIN) 5-325 MG tablet Take 1 tablet by mouth every 6 (six) hours as needed for moderate pain or severe pain. 40 tablet 0  . ipratropium-albuterol (DUONEB) 0.5-2.5 (3) MG/3ML SOLN Take 3 mLs by nebulization 4 (four) times daily.    . traMADol (ULTRAM) 50 MG tablet Take 1-2 tablets (50-100 mg total) by mouth every 6 (six) hours as needed for moderate pain. (Patient taking differently: Take 50 mg by mouth every 6 (six) hours as needed for moderate pain. ) 30 tablet 0   No current facility-administered medications for this  visit.        Physical Exam: BP 110/60 (BP Location: Right Arm, Patient Position: Sitting, Cuff Size: Small)   Ht '5\' 4"'$  (1.626 m)   Wt 115 lb (52.2 kg)   BMI 19.74 kg/m   General appearance: alert and cooperative Neurologic: intact Heart: regular rate and rhythm, S1, S2 normal, no murmur, click, rub or gallop Lungs: diminished breath sounds bibasilar Abdomen: soft, non-tender; bowel sounds normal; no masses,  no organomegaly Extremities: extremities normal, atraumatic, no cyanosis or edema and Homans sign is negative, no sign of DVT Wound: well healed Patient has no cervical or supraclavicular adenopathy   Diagnostic Studies & Laboratory data:     Recent Radiology Findings:   Dg Chest 2 View  Result Date: 02/08/2016 CLINICAL DATA:  Malignant neoplasm of the lung.  Status post VATS. EXAM: CHEST  2 VIEW COMPARISON:  CT of the chest with contrast 11/22/2015. FINDINGS: The heart size is normal. Right upper lobectomy is again noted. Scarring at the right upper lung apex is stable. No new nodule or mass lesion is present. The visualized soft tissues and bony thorax are unremarkable. IMPRESSION: 1. Status post right upper lobectomy. 2. No acute abnormality. 3. No evidence for residual or recurrent disease. Electronically Signed   By: San Morelle M.D.   On: 02/08/2016 15:55   CT scan was done in May 2017 in [place] -there was no evidence of recurrent disease   Recent Lab Findings: Lab Results  Component Value Date   WBC 5.8 04/18/2015   HGB 7.6* 04/18/2015   HCT 23.9* 04/18/2015   PLT 334 04/18/2015   GLUCOSE 97 05/04/2015   CHOL  04/19/2009    190        ATP III CLASSIFICATION:  <200     mg/dL   Desirable  200-239  mg/dL   Borderline High  >=240    mg/dL   High        SLIGHT HEMOLYSIS   TRIG 116 SLIGHT HEMOLYSIS 04/19/2009   HDL 36 SLIGHT HEMOLYSIS* 04/19/2009   LDLCALC * 04/19/2009    131        Total Cholesterol/HDL:CHD Risk Coronary Heart Disease Risk Table                      Men   Women  1/2 Average Risk   3.4   3.3  Average Risk       5.0   4.4  2 X Average Risk   9.6   7.1  3 X Average Risk  23.4   11.0        Use the calculated Patient Ratio above and the CHD Risk Table to determine the patient's CHD Risk.        ATP III  CLASSIFICATION (LDL):  <100     mg/dL   Optimal  100-129  mg/dL   Near or Above                    Optimal  130-159  mg/dL   Borderline  160-189  mg/dL   High  >190     mg/dL   Very High   ALT 10* 04/18/2015   AST 17 04/18/2015   NA 139 05/04/2015   K 4.1 05/04/2015   CL 101 05/04/2015   CREATININE 0.50 05/04/2015   BUN 12 05/04/2015   CO2 27 05/04/2015   TSH 1.539 Test methodology is 3rd generation TSH 04/19/2009   INR 1.15 04/18/2015      Assessment / Plan:    Patient doing well following resection of stage II a, non-small cell carcinoma. She remains tobacco free Plan to see her back in 6 months, she has a follow-up CT scan ordered in [place] in October 2017.    Grace Isaac MD      [address] ,[place] [address] Office [phone]   Murlean Hark [phone]  02/08/2016 4:16 PM      Patient ID: Michelle King, female   DOB: 12/05/1960, 55 y.o.   MRN: [id number]

## 2016-03-28 DIAGNOSIS — C3411 Malignant neoplasm of upper lobe, right bronchus or lung: Secondary | ICD-10-CM

## 2016-06-11 ENCOUNTER — Ambulatory Visit: Payer: Self-pay | Admitting: Allergy and Immunology

## 2016-06-14 ENCOUNTER — Encounter: Payer: Self-pay | Admitting: Allergy and Immunology

## 2016-06-14 ENCOUNTER — Other Ambulatory Visit: Payer: Self-pay | Admitting: Allergy and Immunology

## 2016-06-14 ENCOUNTER — Ambulatory Visit (INDEPENDENT_AMBULATORY_CARE_PROVIDER_SITE_OTHER): Payer: Medicare Other | Admitting: Allergy and Immunology

## 2016-06-14 VITALS — BP 130/84 | HR 80 | Temp 98.4°F | Resp 16 | Ht 60.75 in | Wt 126.0 lb

## 2016-06-14 DIAGNOSIS — L299 Pruritus, unspecified: Secondary | ICD-10-CM | POA: Diagnosis not present

## 2016-06-14 DIAGNOSIS — L989 Disorder of the skin and subcutaneous tissue, unspecified: Secondary | ICD-10-CM

## 2016-06-14 DIAGNOSIS — L308 Other specified dermatitis: Secondary | ICD-10-CM | POA: Diagnosis not present

## 2016-06-14 DIAGNOSIS — L500 Allergic urticaria: Secondary | ICD-10-CM

## 2016-06-14 MED ORDER — MOMETASONE FUROATE 0.1 % EX OINT
TOPICAL_OINTMENT | Freq: Every day | CUTANEOUS | 3 refills | Status: DC
Start: 2016-06-14 — End: 2017-08-20

## 2016-06-14 MED ORDER — MONTELUKAST SODIUM 10 MG PO TABS
10.0000 mg | ORAL_TABLET | Freq: Every day | ORAL | 5 refills | Status: DC
Start: 2016-06-14 — End: 2017-02-20

## 2016-06-14 MED ORDER — CETIRIZINE HCL 10 MG PO TABS: 10.0000 mg | ORAL_TABLET | Freq: Every day | ORAL | 5 refills | Status: DC

## 2016-06-14 MED ORDER — DOXEPIN HCL 10 MG PO CAPS
10.0000 mg | ORAL_CAPSULE | Freq: Every day | ORAL | 3 refills | Status: DC
Start: 2016-06-14 — End: 2017-02-20

## 2016-06-14 MED ORDER — RANITIDINE HCL 300 MG PO TABS: 300.0000 mg | ORAL_TABLET | Freq: Every day | ORAL | 3 refills | Status: DC

## 2016-06-14 NOTE — Patient Instructions (Addendum)
  1. Allergen avoidance measures?  2. Blood - CMP, SED, CRP, TSH, T4, TP, celiac screen with IgA,   3. Urease breath test (before ranitidine)  4. Every day utilize the following medications:   A. cetirizine 10 mg tablet 1 time a day in a.m.  B. doxepin 10 mg tablet 1 time a day in p.m.  C. ranitidine 300 mg tablet 1 time a day in p.m.  D. montelukast 10 mg tablet 1 time a day in p.m.   5. After shower may apply mometasone 0.1% ointment to all affected areas  6. Return in 2 weeks or earlier if problem

## 2016-06-14 NOTE — Progress Notes (Signed)
Dear Dr. Spero Curb,  Thank you for referring Michelle King to the [facility] on 06/14/2016.   Below is a summation of this patient's evaluation and recommendations.  Thank you for your referral. I will keep you informed about this patient's response to treatment.   If you have any questions please do not hesitate to contact me.   Sincerely,  Jiles Prows, MD [facility]   ______________________________________________________________________    NEW PATIENT NOTE  Referring Provider: Charlotte Sanes, MD Primary Provider: Charlotte Sanes, MD Date of office visit: 06/14/2016    Subjective:   Chief Complaint:  Michelle King (DOB: [birth year]) is a 55 y.o. female who presents to the clinic on 06/14/2016 with a chief complaint of Urticaria (x 8 months) .     HPI: [name] presents to this clinic in evaluation of an eight-month history of an itchy dermatitis that has been evaluated by [facility] dermatology. She is intensely itching across her entire body except her scalp and she excoriates her skin. She will developed these red raised itchy lesions across her body that lasts several hours and resolved without any hyperpigmentation. This issue is significantly disturbing her sleep. She's been treated empirically for scabies infestation apparently and has been treated with systemic steroids and antihistamines and topical steroids all which she thinks does not help her to any large degree.  She has no associated systemic or constitutional symptoms. There has not really been a significant environmental change to explain why this is occurring. She has not had any significant change in her medical regime to explain this issue.  Past Medical History:  Diagnosis Date  . CAD (coronary artery disease)   . Cancer ([place])    lung  . Complication of anesthesia   . COPD (chronic  obstructive pulmonary disease) ([place])   . Hyperlipidemia   . Myocardial infarction 2010  . Pneumonia    hx  . PONV (postoperative nausea and vomiting)   . S/P hysterectomy   . Shortness of breath dyspnea     Past Surgical History:  Procedure Laterality Date  . APPENDECTOMY    . CARDIAC CATHETERIZATION     stents  . COLONOSCOPY N/A 04/18/2015   Procedure: COLONOSCOPY;  Surgeon: Ladene Artist, MD;  Location: [facility] ENDOSCOPY;  Service: Endoscopy;  Laterality: N/A;  . MOUTH SURGERY     teeth  . TUBAL LIGATION    . VIDEO ASSISTED THORACOSCOPY (VATS)/WEDGE RESECTION Right 03/25/2015   Procedure: VIDEO ASSISTED THORACOSCOPY (VATS)/LUNG RESECTION;  Surgeon: Grace Isaac, MD;  [facility];  Service: Thoracic;  Laterality: Right;  Marland Kitchen VIDEO BRONCHOSCOPY N/A 03/25/2015   Procedure: VIDEO BRONCHOSCOPY;  Surgeon: Grace Isaac, MD;  Location: [facility] OR;  Service: Thoracic;  Laterality: N/A;  . VIDEO BRONCHOSCOPY WITH INSERTION OF INTERBRONCHIAL VALVE (IBV) N/A 04/12/2015   Procedure: VIDEO BRONCHOSCOPY WITH INSERTION OF 7MM INTERBRONCHIAL VALVES (IBV) IN SUPERIOR SEGMENT, MEDIAL AND POSTERIOR SEGMENT, AND LATERAL SEGMENT OF RIGHT LOWER LOBE;  Surgeon: Grace Isaac, MD;  Location: [facility] OR;  Service: Thoracic;  Laterality: N/A;  59m Spiration Valve placed in Superior Segment, Medial and Posterior Segment, and Lateral Segment of Right Lower Lobe  . VIDEO BRONCHOSCOPY WITH INSERTION OF INTERBRONCHIAL VALVE (IBV) Right 04/19/2015   Procedure: VIDEO BRONCHOSCOPY WITH INSERTION OF INTERBRONCHIAL VALVE (IBV);  Surgeon: EGrace Isaac MD;  [facility]  Service: Thoracic;  Laterality: Right;  .  VIDEO BRONCHOSCOPY WITH INSERTION OF INTERBRONCHIAL VALVE (IBV) N/A 06/03/2015   Procedure: VIDEO BRONCHOSCOPY WITH REMOVAL OF INTERBRONCHIAL VALVE (IBV);  Surgeon: Grace Isaac, MD;  Location: [facility] OR;  Service: Thoracic;  Laterality: N/A;      Medication List      ALPRAZolam 0.5 MG tablet Commonly  known as:  XANAX Take 0.5 mg by mouth 3 (three) times daily as needed for anxiety.   ANORO ELLIPTA 62.5-25 MCG/INH Aepb Generic drug:  umeclidinium-vilanterol Inhale 1 puff into the lungs daily.   aspirin EC 81 MG tablet Take 81 mg by mouth daily.   clopidogrel 75 MG tablet Commonly known as:  PLAVIX Take 75 mg by mouth daily.   docusate sodium 100 MG capsule Commonly known as:  COLACE Take 100 mg by mouth at bedtime.   furosemide 20 MG tablet Commonly known as:  LASIX Take 20 mg by mouth daily.   gabapentin 100 MG capsule Commonly known as:  NEURONTIN Take 100 mg by mouth daily.   loratadine 10 MG tablet Commonly known as:  CLARITIN Take 10 mg by mouth daily as needed for allergies.   rOPINIRole 0.5 MG tablet Commonly known as:  REQUIP Take 0.5 mg by mouth daily.   traMADol 50 MG tablet Commonly known as:  ULTRAM Take 1-2 tablets (50-100 mg total) by mouth every 6 (six) hours as needed for moderate pain.       Allergies  Allergen Reactions  . Codeine Itching and Swelling    Facial swelling Tolerates norco  . Rosuvastatin Other (See Comments)    Nose bleeds  . Sympathomimetics Other (See Comments)    Pt was not told what the reaction was    Review of systems negative except as noted in HPI / PMHx or noted below:  Review of Systems  Constitutional: Negative.   HENT: Negative.   Eyes: Negative.   Respiratory: Negative.   Cardiovascular: Negative.   Gastrointestinal: Negative.   Genitourinary: Negative.   Musculoskeletal: Negative.   Skin: Negative.   Neurological: Negative.   Endo/Heme/Allergies: Negative.   Psychiatric/Behavioral: Negative.     Family History  Problem Relation Age of Onset  . Diabetes Mother   . Cancer Father     colon  . Stroke Sister   . Cancer Brother     colon cancer  . Cancer Sister     lung cancer  . Heart disease Brother   . Cerebrovascular Disease Brother   . Heart disease Brother   . Cerebrovascular Disease  Brother   . Stroke Sister     Social History   Social History  . Marital status: Married    Spouse name: N/A  . Number of children: N/A  . Years of education: N/A   Occupational History  . Not on file.   Social History Main Topics  . Smoking status: Former Smoker    Packs/day: 0.50    Years: 40.00    Types: Cigarettes    Quit date: 03/25/2015  . Smokeless tobacco: Never Used     Comment: down to 4 aday  . Alcohol use No  . Drug use: No  . Sexual activity: Not on file   Other Topics Concern  . Not on file   Social History Narrative  . No narrative on file    Environmental and Social history  Lives in a house with a dry environment, 4 dogs located inside the household, carpeting in the bedroom, no plastic on the bed or pillow, and  no smokers located inside the household.  Objective:   Vitals:   06/14/16 0904  BP: 130/84  Pulse: 80  Resp: 16  Temp: 98.4 F (36.9 C)   Height: 5' 0.75" (154.3 cm) Weight: 126 lb (57.2 kg)  Physical Exam  Constitutional: She is well-developed, well-nourished, and in no distress.  HENT:  Head: Normocephalic. Head is without right periorbital erythema and without left periorbital erythema.  Right Ear: Tympanic membrane, external ear and ear canal normal.  Left Ear: Tympanic membrane, external ear and ear canal normal.  Nose: Nose normal. No mucosal edema or rhinorrhea.  Mouth/Throat: Oropharynx is clear and moist and mucous membranes are normal. No oropharyngeal exudate.  Eyes: Conjunctivae and lids are normal. Pupils are equal, round, and reactive to light.  Neck: Trachea normal. No tracheal deviation present. No thyromegaly present.  Cardiovascular: Normal rate, regular rhythm, S1 normal, S2 normal and normal heart sounds.   No murmur heard. Pulmonary/Chest: Effort normal. No stridor. No tachypnea. No respiratory distress. She has no wheezes. She has no rales. She exhibits no tenderness.  Abdominal: Soft. She exhibits no  distension and no mass. There is no hepatosplenomegaly. There is no tenderness. There is no rebound and no guarding.  Musculoskeletal: She exhibits no edema or tenderness.  Lymphadenopathy:       Head (right side): No tonsillar adenopathy present.       Head (left side): No tonsillar adenopathy present.    She has no cervical adenopathy.    She has no axillary adenopathy.  Neurological: She is alert. Gait normal.  Skin: Rash (multiple excoriated papules and ulcers across skin with evidence of blanching urticarial lesions affecting forearms bilaterally.) noted. She is not diaphoretic. No erythema. No pallor. Nails show no clubbing.  Psychiatric: Mood and affect normal.    Diagnostics: Allergy skin tests were performed. She did not demonstrate any hypersensitivity against a screening panel of aeroallergens or foods  Review skin biopsy performed by [facility] dermatology dated 04/10/2016 refers to a urticarial allergic reaction with a superficial perivascular infiltrate of lymphocytes and histiocytes and scattered eosinophils.  Results of blood tests obtained on 06/01/2016 refers to a white blood cell count of 5.9 with a normal differential, hemoglobin 11.2, and platelet count 347  Assessment and Plan:    1. Allergic urticaria   2. Inflammatory dermatosis   3. Pruritic disorder     1. Allergen avoidance measures?  2. Blood - CMP, SED, CRP, TSH, T4, TP, celiac screen with IgA,   3. Urease breath test (before ranitidine)  4. Every day utilize the following medications:   A. cetirizine 10 mg tablet 1 time a day in a.m.  B. doxepin 10 mg tablet 1 time a day in p.m.  C. ranitidine 300 mg tablet 1 time a day in p.m.  D. montelukast 10 mg tablet 1 time a day in p.m.   5. After shower may apply mometasone 0.1% ointment to all affected areas  6. 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 I will regroup with her in 2 weeks or earlier if there is a problem.  Jiles Prows, MD [place]  [place]

## 2016-06-17 LAB — H. PYLORI BREATH TEST: H. pylori UBiT: NEGATIVE

## 2016-06-19 LAB — COMPREHENSIVE METABOLIC PANEL
ALBUMIN: 4.5 g/dL (ref 3.5–5.5)
ALK PHOS: 71 IU/L (ref 39–117)
ALT: 10 IU/L (ref 0–32)
AST: 13 IU/L (ref 0–40)
Albumin/Globulin Ratio: 2 (ref 1.2–2.2)
BUN / CREAT RATIO: 24 — AB (ref 9–23)
BUN: 14 mg/dL (ref 6–24)
Bilirubin Total: 0.3 mg/dL (ref 0.0–1.2)
CALCIUM: 9.4 mg/dL (ref 8.7–10.2)
CO2: 24 mmol/L (ref 18–29)
CREATININE: 0.59 mg/dL (ref 0.57–1.00)
Chloride: 101 mmol/L (ref 96–106)
GFR, EST AFRICAN AMERICAN: 118 mL/min/{1.73_m2} (ref >59)
GFR, EST NON AFRICAN AMERICAN: 103 mL/min/{1.73_m2} (ref >59)
GLOBULIN, TOTAL: 2.3 g/dL (ref 1.5–4.5)
Glucose: 102 mg/dL — ABNORMAL HIGH (ref 65–99)
Potassium: 4.5 mmol/L (ref 3.5–5.2)
SODIUM: 144 mmol/L (ref 134–144)
Total Protein: 6.8 g/dL (ref 6.0–8.5)

## 2016-06-19 LAB — CELIAC PANEL 10
Antigliadin Abs, IgA: 6 units (ref 0–19)
Endomysial IgA: NEGATIVE
GLIADIN IGG: 2 U (ref 0–19)
IGA/IMMUNOGLOBULIN A, SERUM: 294 mg/dL (ref 87–352)
Tissue Transglut Ab: <2 U/mL (ref 0–5)

## 2016-06-19 LAB — THYROID PEROXIDASE ANTIBODY: Thyroperoxidase Ab SerPl-aCnc: 10 IU/mL (ref 0–34)

## 2016-06-19 LAB — TSH+FREE T4
Free T4: 1.33 ng/dL (ref 0.82–1.77)
TSH: 1.74 u[IU]/mL (ref 0.450–4.500)

## 2016-06-19 LAB — C-REACTIVE PROTEIN: CRP: 2.2 mg/L (ref 0.0–4.9)

## 2016-06-19 LAB — SEDIMENTATION RATE: Sed Rate: 9 mm/hr (ref 0–40)

## 2016-06-25 ENCOUNTER — Ambulatory Visit (INDEPENDENT_AMBULATORY_CARE_PROVIDER_SITE_OTHER): Payer: Medicare Other | Admitting: Allergy and Immunology

## 2016-06-25 ENCOUNTER — Encounter: Payer: Self-pay | Admitting: Allergy and Immunology

## 2016-06-25 VITALS — BP 130/80 | HR 104 | Resp 20

## 2016-06-25 DIAGNOSIS — L299 Pruritus, unspecified: Secondary | ICD-10-CM | POA: Diagnosis not present

## 2016-06-25 DIAGNOSIS — L308 Other specified dermatitis: Secondary | ICD-10-CM

## 2016-06-25 DIAGNOSIS — L500 Allergic urticaria: Secondary | ICD-10-CM | POA: Diagnosis not present

## 2016-06-25 DIAGNOSIS — L989 Disorder of the skin and subcutaneous tissue, unspecified: Secondary | ICD-10-CM

## 2016-06-25 MED ORDER — DOXEPIN HCL 10 MG PO CAPS
ORAL_CAPSULE | ORAL | 1 refills | Status: DC
Start: 2016-06-25 — End: 2017-02-20

## 2016-06-25 NOTE — Progress Notes (Signed)
Follow-up Note  Referring Provider: Charlotte Sanes, MD Primary Provider: Charlotte Sanes, MD Date of Office Visit: 06/25/2016  Subjective:   Michelle King (DOB: [birth year]) is a 55 y.o. female who returns to the [facility] on 06/25/2016 in re-evaluation of the following:  HPI: Michelle King returns to this clinic noting that she has not really had much improvement with medical therapy prescribed during her initial evaluation of 06/14/2016. She still continues to be extremely itchy and she is excoriating her skin. She still having disturbed sleep because of this issue.    Medication List      ALPRAZolam 0.5 MG tablet Commonly known as:  XANAX Take 0.5 mg by mouth 3 (three) times daily as needed for anxiety.   ANORO ELLIPTA 62.5-25 MCG/INH Aepb Generic drug:  umeclidinium-vilanterol Inhale 1 puff into the lungs daily.   aspirin EC 81 MG tablet Take 81 mg by mouth daily.   cetirizine 10 MG tablet Commonly known as:  ZYRTEC Take 1 tablet (10 mg total) by mouth daily.   clopidogrel 75 MG tablet Commonly known as:  PLAVIX Take 75 mg by mouth daily.   docusate sodium 100 MG capsule Commonly known as:  COLACE Take 100 mg by mouth at bedtime.   doxepin 10 MG capsule Commonly known as:  SINEQUAN Take 1 capsule (10 mg total) by mouth at bedtime.   furosemide 20 MG tablet Commonly known as:  LASIX Take 20 mg by mouth daily.   gabapentin 100 MG capsule Commonly known as:  NEURONTIN Take 100 mg by mouth daily.   mometasone 0.1 % ointment Commonly known as:  ELOCON Apply topically daily.   montelukast 10 MG tablet Commonly known as:  SINGULAIR Take 1 tablet (10 mg total) by mouth at bedtime.   ranitidine 300 MG tablet Commonly known as:  ZANTAC Take 1 tablet (300 mg total) by mouth at bedtime.   rOPINIRole 0.5 MG tablet Commonly known as:  REQUIP Take 0.5 mg by mouth daily.   traMADol 50 MG tablet Commonly known as:  ULTRAM Take 1-2  tablets (50-100 mg total) by mouth every 6 (six) hours as needed for moderate pain.       Past Medical History:  Diagnosis Date  . CAD (coronary artery disease)   . Cancer ([place])    lung  . Complication of anesthesia   . COPD (chronic obstructive pulmonary disease) ([place])   . Hyperlipidemia   . Myocardial infarction 2010  . Pneumonia    hx  . PONV (postoperative nausea and vomiting)   . S/P hysterectomy   . Shortness of breath dyspnea     Past Surgical History:  Procedure Laterality Date  . APPENDECTOMY    . CARDIAC CATHETERIZATION     stents  . COLONOSCOPY N/A 04/18/2015   Procedure: COLONOSCOPY;  Surgeon: Ladene Artist, MD;  Location: [facility] ENDOSCOPY;  Service: Endoscopy;  Laterality: N/A;  . MOUTH SURGERY     teeth  . TUBAL LIGATION    . VIDEO ASSISTED THORACOSCOPY (VATS)/WEDGE RESECTION Right 03/25/2015   Procedure: VIDEO ASSISTED THORACOSCOPY (VATS)/LUNG RESECTION;  Surgeon: Grace Isaac, MD;  Location: [place];  Service: Thoracic;  Laterality: Right;  Marland Kitchen VIDEO BRONCHOSCOPY N/A 03/25/2015   Procedure: VIDEO BRONCHOSCOPY;  Surgeon: Grace Isaac, MD;  Location: [facility] OR;  Service: Thoracic;  Laterality: N/A;  . VIDEO BRONCHOSCOPY WITH INSERTION OF INTERBRONCHIAL VALVE (IBV) N/A 04/12/2015   Procedure: VIDEO BRONCHOSCOPY WITH INSERTION OF 7MM INTERBRONCHIAL VALVES (IBV)  IN SUPERIOR SEGMENT, MEDIAL AND POSTERIOR SEGMENT, AND LATERAL SEGMENT OF RIGHT LOWER LOBE;  Surgeon: Grace Isaac, MD;  Location: [facility] OR;  Service: Thoracic;  Laterality: N/A;  60m Spiration Valve placed in Superior Segment, Medial and Posterior Segment, and Lateral Segment of Right Lower Lobe  . VIDEO BRONCHOSCOPY WITH INSERTION OF INTERBRONCHIAL VALVE (IBV) Right 04/19/2015   Procedure: VIDEO BRONCHOSCOPY WITH INSERTION OF INTERBRONCHIAL VALVE (IBV);  Surgeon: EGrace Isaac MD;  [facility]  Service: Thoracic;  Laterality: Right;  .Marland KitchenVIDEO BRONCHOSCOPY WITH INSERTION OF INTERBRONCHIAL VALVE (IBV) N/A  06/03/2015   Procedure: VIDEO BRONCHOSCOPY WITH REMOVAL OF INTERBRONCHIAL VALVE (IBV);  Surgeon: EGrace Isaac MD;  [facility];  Service: Thoracic;  Laterality: N/A;    Allergies  Allergen Reactions  . Codeine Itching and Swelling    Facial swelling Tolerates norco  . Rosuvastatin Other (See Comments)    Nose bleeds  . Sympathomimetics Other (See Comments)    Pt was not told what the reaction was    Review of systems negative except as noted in HPI / PMHx or noted below:  Review of Systems  Constitutional: Negative.   HENT: Negative.   Eyes: Negative.   Respiratory: Negative.   Cardiovascular: Negative.   Gastrointestinal: Negative.   Genitourinary: Negative.   Musculoskeletal: Negative.   Skin: Negative.   Neurological: Negative.   Endo/Heme/Allergies: Negative.   Psychiatric/Behavioral: Negative.      Objective:   Vitals:   06/25/16 1604  BP: 130/80  Pulse: (!) 104  Resp: 20          Physical Exam  Skin: Rash (Multiple excoriated papules and unroofed ulcers across skin with out any urticaria.) noted.    Diagnostics: none  Assessment and Plan:   1. Allergic urticaria   2. Inflammatory dermatosis   3. Pruritic disorder     1. Every day utilize the following medications:   A. Increase cetirizine 10 mg 2 tablet 1 time a day in a.m.  B. Increase doxepin 10 mg 3 tablet 1 time a day in p.m.  C. ranitidine 300 mg tablet 1 time a day in p.m.  D. montelukast 10 mg tablet 1 time a day in p.m.   2. After shower may apply vaseline to skin  3. Apply for Xolair administration  4. Return in 2 weeks or earlier if problem  I'm going to have PMardene Celesteincrease her cetirizine and doxepin dose as noted above and we'll attempt to get her on Xolair administration pending insurance approval to help with her immunological hyperreactivity. I am not going to ask her to use any topical steroids at this point time as this appeared to be relatively ineffective in  alleviating her symptoms. I'll regroup with her in 2 weeks to make a decision about further evaluation and treatment.  [name] MD [place]

## 2016-06-25 NOTE — Patient Instructions (Addendum)
  1. Every day utilize the following medications:   A. Increase cetirizine 10 mg 2 tablet 1 time a day in a.m.  B. Increase doxepin 10 mg 3 tablet 1 time a day in p.m.  C. ranitidine 300 mg tablet 1 time a day in p.m.  D. montelukast 10 mg tablet 1 time a day in p.m.   2. After shower may apply vaseline to skin  3. Apply for Xolair administration  4. Return in 2 weeks or earlier if problem

## 2016-07-06 ENCOUNTER — Ambulatory Visit (INDEPENDENT_AMBULATORY_CARE_PROVIDER_SITE_OTHER): Payer: Medicare Other | Admitting: Allergy and Immunology

## 2016-07-06 ENCOUNTER — Encounter: Payer: Self-pay | Admitting: Allergy and Immunology

## 2016-07-06 VITALS — BP 110/76 | HR 88 | Resp 16

## 2016-07-06 DIAGNOSIS — L308 Other specified dermatitis: Secondary | ICD-10-CM

## 2016-07-06 DIAGNOSIS — L989 Disorder of the skin and subcutaneous tissue, unspecified: Secondary | ICD-10-CM

## 2016-07-06 DIAGNOSIS — L501 Idiopathic urticaria: Secondary | ICD-10-CM | POA: Diagnosis not present

## 2016-07-06 MED ORDER — EPINEPHRINE 0.3 MG/0.3ML IJ SOAJ: 0.3000 mg | Freq: Once | INTRAMUSCULAR | 1 refills | Status: AC

## 2016-07-06 MED ORDER — OMALIZUMAB 150 MG ~~LOC~~ SOLR
300.0000 mg | SUBCUTANEOUS | Status: DC
  Administered 2016-07-06 – 2016-08-30 (×3): 300 mg via SUBCUTANEOUS

## 2016-07-06 NOTE — Progress Notes (Signed)
Patient started Xolair injections '300mg'$  every 28 days for treatment of urticaria.  Reviewed side effects and schedule and epipen use. Rx sent for epipen

## 2016-07-06 NOTE — Progress Notes (Signed)
Follow-up Note  Referring Provider: Charlotte Sanes, MD Primary Provider: Charlotte Sanes, MD Date of Office Visit: 07/06/2016  Subjective:   Michelle King (DOB: 1961-06-06) is a 55 y.o. female who returns to the [facility] [place] on 07/06/2016 in re-evaluation of the following:  HPI: Danyale returns to this clinic in evaluation of her chronic urticarial syndrome and intense pruritus. Once again she is not much better with the current medical therapy that we prescribed approximately 2 weeks ago. She still continues to have incessant itchiness and continues to have recurrent red patches across her body.  Allergies as of 07/06/2016      Reactions   Codeine Itching, Swelling   Facial swelling Tolerates norco   Rosuvastatin Other (See Comments)   Nose bleeds   Sympathomimetics Other (See Comments)   Pt was not told what the reaction was      Medication List      ALPRAZolam 0.5 MG tablet Commonly known as:  XANAX Take 0.5 mg by mouth 3 (three) times daily as needed for anxiety.   ANORO ELLIPTA 62.5-25 MCG/INH Aepb Generic drug:  umeclidinium-vilanterol Inhale 1 puff into the lungs daily.   aspirin EC 81 MG tablet Take 81 mg by mouth daily.   cetirizine 10 MG tablet Commonly known as:  ZYRTEC Take 1 tablet (10 mg total) by mouth daily.   clopidogrel 75 MG tablet Commonly known as:  PLAVIX Take 75 mg by mouth daily.   docusate sodium 100 MG capsule Commonly known as:  COLACE Take 100 mg by mouth at bedtime.   doxepin 10 MG capsule Commonly known as:  SINEQUAN Take 1 capsule (10 mg total) by mouth at bedtime.   doxepin 10 MG capsule Commonly known as:  SINEQUAN Take 3 capsules one time per day in the evening.   EPINEPHrine 0.3 mg/0.3 mL Soaj injection Commonly known as:  EPI-PEN Inject 0.3 mLs (0.3 mg total) into the muscle once. As needed for life-threatening allergic reactions   furosemide 20 MG tablet Commonly known as:  LASIX Take  20 mg by mouth daily.   gabapentin 100 MG capsule Commonly known as:  NEURONTIN Take 100 mg by mouth daily.   mometasone 0.1 % ointment Commonly known as:  ELOCON Apply topically daily.   montelukast 10 MG tablet Commonly known as:  SINGULAIR Take 1 tablet (10 mg total) by mouth at bedtime.   ranitidine 300 MG tablet Commonly known as:  ZANTAC Take 1 tablet (300 mg total) by mouth at bedtime.   rOPINIRole 0.5 MG tablet Commonly known as:  REQUIP Take 0.5 mg by mouth daily.   traMADol 50 MG tablet Commonly known as:  ULTRAM Take 1-2 tablets (50-100 mg total) by mouth every 6 (six) hours as needed for moderate pain.   XOLAIR 150 MG injection Generic drug:  omalizumab       Past Medical History:  Diagnosis Date  . CAD (coronary artery disease)   . Cancer ([place])    lung  . Complication of anesthesia   . COPD (chronic obstructive pulmonary disease) ([place])   . Hyperlipidemia   . Myocardial infarction 2010  . Pneumonia    hx  . PONV (postoperative nausea and vomiting)   . S/P hysterectomy   . Shortness of breath dyspnea     Past Surgical History:  Procedure Laterality Date  . APPENDECTOMY    . CARDIAC CATHETERIZATION     stents  . COLONOSCOPY N/A 04/18/2015   Procedure: COLONOSCOPY;  Surgeon: Ladene Artist, MD;  Location: [facility] ENDOSCOPY;  Service: Endoscopy;  Laterality: N/A;  . MOUTH SURGERY     teeth  . TUBAL LIGATION    . VIDEO ASSISTED THORACOSCOPY (VATS)/WEDGE RESECTION Right 03/25/2015   Procedure: VIDEO ASSISTED THORACOSCOPY (VATS)/LUNG RESECTION;  Surgeon: Grace Isaac, MD;  Location: [place];  Service: Thoracic;  Laterality: Right;  Marland Kitchen VIDEO BRONCHOSCOPY N/A 03/25/2015   Procedure: VIDEO BRONCHOSCOPY;  Surgeon: Grace Isaac, MD;  Location: [facility] OR;  Service: Thoracic;  Laterality: N/A;  . VIDEO BRONCHOSCOPY WITH INSERTION OF INTERBRONCHIAL VALVE (IBV) N/A 04/12/2015   Procedure: VIDEO BRONCHOSCOPY WITH INSERTION OF 7MM INTERBRONCHIAL VALVES (IBV) IN  SUPERIOR SEGMENT, MEDIAL AND POSTERIOR SEGMENT, AND LATERAL SEGMENT OF RIGHT LOWER LOBE;  Surgeon: Grace Isaac, MD;  Location: [facility] OR;  Service: Thoracic;  Laterality: N/A;  32m Spiration Valve placed in Superior Segment, Medial and Posterior Segment, and Lateral Segment of Right Lower Lobe  . VIDEO BRONCHOSCOPY WITH INSERTION OF INTERBRONCHIAL VALVE (IBV) Right 04/19/2015   Procedure: VIDEO BRONCHOSCOPY WITH INSERTION OF INTERBRONCHIAL VALVE (IBV);  Surgeon: EGrace Isaac MD;  [place]  Service: Thoracic;  Laterality: Right;  .Marland KitchenVIDEO BRONCHOSCOPY WITH INSERTION OF INTERBRONCHIAL VALVE (IBV) N/A 06/03/2015   Procedure: VIDEO BRONCHOSCOPY WITH REMOVAL OF INTERBRONCHIAL VALVE (IBV);  Surgeon: EGrace Isaac MD;  [facility];  Service: Thoracic;  Laterality: N/A;    Review of systems negative except as noted in HPI / PMHx or noted below:  Review of Systems  Constitutional: Negative.   HENT: Negative.   Eyes: Negative.   Respiratory: Negative.   Cardiovascular: Negative.   Gastrointestinal: Negative.   Genitourinary: Negative.   Musculoskeletal: Negative.   Skin: Negative.   Neurological: Negative.   Endo/Heme/Allergies: Negative.   Psychiatric/Behavioral: Negative.      Objective:   Vitals:   07/06/16 0952  BP: 110/76  Pulse: 88  Resp: 16          Physical Exam  Skin: Rash (Multiple blanching urticarial lesions on trunk. Multiple excoriated and unroofed papules and ulcers across extremities and trunk.) noted.    Diagnostics: none    Assessment and Plan:   1. Idiopathic urticaria   2. Inflammatory dermatosis     1. Every day utilize the following medications:   A. cetirizine 10 mg 2 tablet 1 time a day in a.m.  B. doxepin 10 mg 3 tablet 1 time a day in p.m.  C. ranitidine 300 mg tablet 1 time a day in p.m.  D. montelukast 10 mg tablet 1 time a day in p.m.   2. After shower may apply vaseline to skin  3. Xolair administered in clinic  today  4. Return in 4 weeks or earlier if problem  We started PCorisaon omalizumab injections today and hopefully she will be an early responder to this type of therapy. She will continue to use the medications noted above in an attempt to raise her itch threshold. I'll see her back in this clinic in 4 weeks or earlier if there is a problem.  [name] MD [place]

## 2016-07-06 NOTE — Patient Instructions (Signed)
  1. Every day utilize the following medications:   A. cetirizine 10 mg 2 tablet 1 time a day in a.m.  B. doxepin 10 mg 3 tablet 1 time a day in p.m.  C. ranitidine 300 mg tablet 1 time a day in p.m.  D. montelukast 10 mg tablet 1 time a day in p.m.   2. After shower may apply vaseline to skin  3. Xolair administered in clinic today  4. Return in 4 weeks or earlier if problem

## 2016-07-11 ENCOUNTER — Other Ambulatory Visit: Payer: Self-pay | Admitting: *Deleted

## 2016-07-11 MED ORDER — EPIPEN 2-PAK 0.3 MG/0.3ML IJ SOAJ: INTRAMUSCULAR | 3 refills | Status: DC

## 2016-07-27 DIAGNOSIS — Z85118 Personal history of other malignant neoplasm of bronchus and lung: Secondary | ICD-10-CM | POA: Diagnosis not present

## 2016-08-09 ENCOUNTER — Encounter: Payer: Medicaid Other | Admitting: Cardiothoracic Surgery

## 2016-08-09 ENCOUNTER — Ambulatory Visit: Payer: Self-pay | Admitting: Allergy and Immunology

## 2016-08-10 ENCOUNTER — Ambulatory Visit (INDEPENDENT_AMBULATORY_CARE_PROVIDER_SITE_OTHER): Payer: Medicare Other | Admitting: Allergy and Immunology

## 2016-08-10 ENCOUNTER — Encounter: Payer: Self-pay | Admitting: Allergy and Immunology

## 2016-08-10 VITALS — BP 126/70 | HR 95 | Resp 19

## 2016-08-10 DIAGNOSIS — L308 Other specified dermatitis: Secondary | ICD-10-CM | POA: Diagnosis not present

## 2016-08-10 DIAGNOSIS — L501 Idiopathic urticaria: Secondary | ICD-10-CM | POA: Diagnosis not present

## 2016-08-10 DIAGNOSIS — L500 Allergic urticaria: Secondary | ICD-10-CM | POA: Diagnosis not present

## 2016-08-10 DIAGNOSIS — L989 Disorder of the skin and subcutaneous tissue, unspecified: Secondary | ICD-10-CM

## 2016-08-10 NOTE — Patient Instructions (Addendum)
  1. Every day utilize the following medications:   A. cetirizine 10 mg 2 tablet 1 time a day in a.m.  B. doxepin 10 mg 3 tablet 1 time a day in p.m.  C. ranitidine 300 mg tablet 1 time a day in p.m.  D. montelukast 10 mg tablet 1 time a day in p.m.   2. After shower may apply vaseline to skin  3. Continue Xolair administration  4. [facility] All[facility] Department - Dr. Marcelline Deist  5. Brilinta as substitute for Plavix? Will send message to Dr. Bettina Gavia  6. Blood - CBC w/diff  7. Return in 12 weeks

## 2016-08-10 NOTE — Progress Notes (Signed)
Follow-up Note  Referring Provider: Charlotte Sanes, MD Primary Provider: Charlotte Sanes, MD Date of Office Visit: 08/10/2016  Subjective:   Michelle King (DOB: 1961-04-17) is a 56 y.o. female who returns to the [facility] and [place] on 08/10/2016 in re-evaluation of the following:  HPI: Michelle King returns to this clinic in evaluation of her chronic urticaria and possible inflammatory dermatosis. She is really not much improved at this point in time. She still constantly itches her skin and has been having swollen areas of her skin. She's been consistently using all medical therapy until recently as she feels that these medications do not help her. She has had one injection of omelizumab and will receive her second today. .  Allergies as of 08/10/2016      Reactions   Codeine Itching, Swelling   Facial swelling Tolerates norco   Rosuvastatin Other (See Comments)   Nose bleeds   Sympathomimetics Other (See Comments)   Pt was not told what the reaction was      Medication List      ALPRAZolam 0.5 MG tablet Commonly known as:  XANAX Take 0.5 mg by mouth 3 (three) times daily as needed for anxiety.   ANORO ELLIPTA 62.5-25 MCG/INH Aepb Generic drug:  umeclidinium-vilanterol Inhale 1 puff into the lungs daily.   aspirin EC 81 MG tablet Take 81 mg by mouth daily.   cetirizine 10 MG tablet Commonly known as:  ZYRTEC Take 1 tablet (10 mg total) by mouth daily.   clopidogrel 75 MG tablet Commonly known as:  PLAVIX Take 75 mg by mouth daily.   docusate sodium 100 MG capsule Commonly known as:  COLACE Take 100 mg by mouth at bedtime.   doxepin 10 MG capsule Commonly known as:  SINEQUAN Take 1 capsule (10 mg total) by mouth at bedtime.   doxepin 10 MG capsule Commonly known as:  SINEQUAN Take 3 capsules one time per day in the evening.   EPIPEN 2-PAK 0.3 mg/0.3 mL Soaj injection Generic drug:  EPINEPHrine Use as directed for life threatening allergic  reactions   furosemide 20 MG tablet Commonly known as:  LASIX Take 20 mg by mouth daily.   gabapentin 100 MG capsule Commonly known as:  NEURONTIN Take 100 mg by mouth daily.   mometasone 0.1 % ointment Commonly known as:  ELOCON Apply topically daily.   montelukast 10 MG tablet Commonly known as:  SINGULAIR Take 1 tablet (10 mg total) by mouth at bedtime.   ranitidine 300 MG tablet Commonly known as:  ZANTAC Take 1 tablet (300 mg total) by mouth at bedtime.   rOPINIRole 0.5 MG tablet Commonly known as:  REQUIP Take 0.5 mg by mouth daily.   traMADol 50 MG tablet Commonly known as:  ULTRAM Take 1-2 tablets (50-100 mg total) by mouth every 6 (six) hours as needed for moderate pain.   XOLAIR 150 MG injection Generic drug:  omalizumab       Past Medical History:  Diagnosis Date  . CAD (coronary artery disease)   . Cancer ([place])    lung  . Complication of anesthesia   . COPD (chronic obstructive pulmonary disease) ([place])   . Hyperlipidemia   . Myocardial infarction 2010  . Pneumonia    hx  . PONV (postoperative nausea and vomiting)   . S/P hysterectomy   . Shortness of breath dyspnea     Past Surgical History:  Procedure Laterality Date  . APPENDECTOMY    . CARDIAC  CATHETERIZATION     stents  . COLONOSCOPY N/A 04/18/2015   Procedure: COLONOSCOPY;  Surgeon: Ladene Artist, MD;  Location[facility] ENDOSCOPY;  Service: Endoscopy;  Laterality: N/A;  . MOUTH SURGERY     teeth  . TUBAL LIGATION    . VIDEO ASSISTED THORACOSCOPY (VATS)/WEDGE RESECTION Right 03/25/2015   Procedure: VIDEO ASSISTED THORACOSCOPY (VATS)/LUNG RESECTION;  Surgeon: Grace Isaac, MD;  Location: [place];  Service: Thoracic;  Laterality: Right;  Marland Kitchen VIDEO BRONCHOSCOPY N/A 03/25/2015   Procedure: VIDEO BRONCHOSCOPY;  Surgeon: Grace Isaac, MD;  Location: [facility] OR;  Service: Thoracic;  Laterality: N/A;  . VIDEO BRONCHOSCOPY WITH INSERTION OF INTERBRONCHIAL VALVE (IBV) N/A 04/12/2015   Procedure: VIDEO  BRONCHOSCOPY WITH INSERTION OF 7MM INTERBRONCHIAL VALVES (IBV) IN SUPERIOR SEGMENT, MEDIAL AND POSTERIOR SEGMENT, AND LATERAL SEGMENT OF RIGHT LOWER LOBE;  Surgeon: Grace Isaac, MD;  Location: [facility] OR;  Service: Thoracic;  Laterality: N/A;  84m Spiration Valve placed in Superior Segment, Medial and Posterior Segment, and Lateral Segment of Right Lower Lobe  . VIDEO BRONCHOSCOPY WITH INSERTION OF INTERBRONCHIAL VALVE (IBV) Right 04/19/2015   Procedure: VIDEO BRONCHOSCOPY WITH INSERTION OF INTERBRONCHIAL VALVE (IBV);  Surgeon: EGrace Isaac MD;  [place]  Service: Thoracic;  Laterality: Right;  .Marland KitchenVIDEO BRONCHOSCOPY WITH INSERTION OF INTERBRONCHIAL VALVE (IBV) N/A 06/03/2015   Procedure: VIDEO BRONCHOSCOPY WITH REMOVAL OF INTERBRONCHIAL VALVE (IBV);  Surgeon: EGrace Isaac MD;  [facility];  Service: Thoracic;  Laterality: N/A;    Review of systems negative except as noted in HPI / PMHx or noted below:  Review of Systems  Constitutional: Negative.   HENT: Negative.   Eyes: Negative.   Respiratory: Negative.   Cardiovascular: Negative.   Gastrointestinal: Negative.   Genitourinary: Negative.   Musculoskeletal: Negative.   Skin: Negative.   Neurological: Negative.   Endo/Heme/Allergies: Negative.   Psychiatric/Behavioral: Negative.      Objective:   Vitals:   08/10/16 0951  BP: 126/70  Pulse: 95  Resp: 19          Physical Exam  Skin: Rash (Blanching urticarial patches across trunk and lower extremities with evidence of excoriated and unroofed papules across trunk and extremities) noted.    Diagnostics: None   Assessment and Plan:   1. Allergic urticaria   2. Inflammatory dermatosis     1. Every day utilize the following medications:   A. cetirizine 10 mg 2 tablet 1 time a day in a.m.  B. doxepin 10 mg 3 tablet 1 time a day in p.m.  C. ranitidine 300 mg tablet 1 time a day in p.m.  D. montelukast 10 mg tablet 1 time a day in p.m.   2. After  shower may apply vaseline to skin  3. Continue Xolair administration  [facility] CM[facility]ine Deist 5. Brilinta as substitute for Plavix? Will send message to Dr. MBettina Gavia 6. Blood - CBC w/diff  7. Return in 12 weeks  PDeasiastill has a overactive immune system manifested as urticaria along with some inflammatory dermatosis and she has not responded to any therapy we have administered in the past including the administration of omalizumab although it is somewhat early to assess the true effectiveness of omalizumab. It is possible that she has developed a reaction against Plavix as well. I will discuss with her cardiologist about switching her to a substitute. I will check a CBC with differential as it is been  quite a long period in time since she's had a follow-up CBC. I am going to refer her to Dr. Marcelline Deist to see if there is an option for another immunosuppressive agent to help with her overactive immune system. I will see her back in this clinic in 12 weeks. If she progresses she has the option of coming in to clinic for a systemic steroid.   Allena Katz, MD [place]

## 2016-08-11 LAB — CBC WITH DIFFERENTIAL/PLATELET
BASOS ABS: 0 10*3/uL (ref 0.0–0.2)
Basos: 0 %
EOS (ABSOLUTE): 0.8 10*3/uL — ABNORMAL HIGH (ref 0.0–0.4)
EOS: 10 %
HEMOGLOBIN: 10.2 g/dL — AB (ref 11.1–15.9)
Hematocrit: 33.6 % — ABNORMAL LOW (ref 34.0–46.6)
IMMATURE GRANS (ABS): 0.1 10*3/uL (ref 0.0–0.1)
Immature Granulocytes: 1 %
LYMPHS: 16 %
Lymphocytes Absolute: 1.2 10*3/uL (ref 0.7–3.1)
MCH: 23 pg — ABNORMAL LOW (ref 26.6–33.0)
MCHC: 30.4 g/dL — ABNORMAL LOW (ref 31.5–35.7)
MCV: 76 fL — ABNORMAL LOW (ref 79–97)
MONOCYTES: 9 %
Monocytes Absolute: 0.7 10*3/uL (ref 0.1–0.9)
Neutrophils Absolute: 4.9 10*3/uL (ref 1.4–7.0)
Neutrophils: 64 %
Platelets: 413 10*3/uL — ABNORMAL HIGH (ref 150–379)
RBC: 4.44 x10E6/uL (ref 3.77–5.28)
RDW: 15.7 % — ABNORMAL HIGH (ref 12.3–15.4)
WBC: 7.6 10*3/uL (ref 3.4–10.8)

## 2016-08-16 ENCOUNTER — Encounter: Payer: Medicare Other | Admitting: Cardiothoracic Surgery

## 2016-08-30 ENCOUNTER — Ambulatory Visit (INDEPENDENT_AMBULATORY_CARE_PROVIDER_SITE_OTHER): Payer: Medicare Other | Admitting: *Deleted

## 2016-08-30 ENCOUNTER — Telehealth: Payer: Self-pay | Admitting: *Deleted

## 2016-08-30 DIAGNOSIS — L501 Idiopathic urticaria: Secondary | ICD-10-CM

## 2016-08-30 NOTE — Telephone Encounter (Signed)
Called patient husband to advise her appt at [facility] with Dr Jones Broom on Wed., Feb 21 at 10:00. Phone number to clinic [phone] address [address].  Advised husband of all this info also faxed notes,labs and demo sheets to [facility] Allergy

## 2016-09-04 ENCOUNTER — Encounter: Payer: Self-pay | Admitting: Cardiothoracic Surgery

## 2016-09-04 ENCOUNTER — Ambulatory Visit (INDEPENDENT_AMBULATORY_CARE_PROVIDER_SITE_OTHER): Payer: Medicare Other | Admitting: Cardiothoracic Surgery

## 2016-09-04 VITALS — BP 120/83 | HR 95 | Resp 20 | Ht 64.0 in | Wt 130.0 lb

## 2016-09-04 DIAGNOSIS — Z902 Acquired absence of lung [part of]: Secondary | ICD-10-CM

## 2016-09-04 DIAGNOSIS — C3431 Malignant neoplasm of lower lobe, right bronchus or lung: Secondary | ICD-10-CM

## 2016-09-04 DIAGNOSIS — C3411 Malignant neoplasm of upper lobe, right bronchus or lung: Secondary | ICD-10-CM | POA: Diagnosis not present

## 2016-09-04 NOTE — Progress Notes (Signed)
[address]       [place] [address]             [phone]      Michelle King [facility] Medical Record #[id number] Date of Birth: 01/26/1961  Referring: Marice Potter, MD Primary Care: Charlotte Sanes, MD  Chief Complaint:   POST OP FOLLOW UP  Lung cancer-right upper lobe   Staging form: Lung, AJCC 7th Edition     Clinical: No stage assigned - Unsigned     Pathologic stage from 03/29/2015: Stage IIA (yT1b, N1, cM0) - Unsigned  03/25/2015  OPERATIVE REPORT PREOPERATIVE DIAGNOSIS: Adenocarcinoma, right upper lobe, status post chemotherapy. POSTOPERATIVE DIAGNOSIS: Adenocarcinoma, right upper lobe, status post chemotherapy. SURGICAL PROCEDURE: 1. Video bronchoscopy. 2. Right video-assisted thoracoscopy with right upper lobectomy. 3. Lymph node dissection. 4. Placement of ON-Q. SURGEON: Lanelle Bal, M.D  History of Present Illness:     Patient returns to the office after  right upper lobectomy, her course was prolonged secondary to prolonged air leak. Air leak ultimately resolved with placing intrapulmonary valves. The valves have been removed,  Patient continues to improve following surgery 1 1/2  Years ago. She has gained some weight back.  She is not   Smoking, she  is been completely tobacco free since the time of her surgery.  Main complaint now is increasing rash on body except face increasing for 6 months, has derm appointment tomorrow.   recent cardiac cath done in [place] stable none critical disease   Past Medical History:  Diagnosis Date  . CAD (coronary artery disease)   . Cancer ([place])    lung  . Complication of anesthesia   . COPD (chronic obstructive pulmonary disease) ([place])   . Hyperlipidemia   . Myocardial infarction 2010  . Pneumonia    hx  . PONV (postoperative nausea and vomiting)   . S/P hysterectomy   . Shortness of breath dyspnea      History  Smoking Status  . Former Smoker  . Packs/day:  0.50  . Years: 40.00  . Types: Cigarettes  . Quit date: 03/25/2015  Smokeless Tobacco  . Never Used    Comment: down to 4 aday    History  Alcohol Use No     Allergies  Allergen Reactions  . Codeine Itching and Swelling    Facial swelling Tolerates norco  . Rosuvastatin Other (See Comments)    Nose bleeds  . Sympathomimetics Other (See Comments)    Pt was not told what the reaction was    Current Outpatient Prescriptions  Medication Sig Dispense Refill  . ALPRAZolam (XANAX) 0.5 MG tablet Take 0.5 mg by mouth 3 (three) times daily as needed for anxiety.    Marland Kitchen aspirin EC 81 MG tablet Take 81 mg by mouth daily.    . cetirizine (ZYRTEC) 10 MG tablet Take 1 tablet (10 mg total) by mouth daily. 30 tablet 5  . clopidogrel (PLAVIX) 75 MG tablet Take 75 mg by mouth daily.    Marland Kitchen docusate sodium (COLACE) 100 MG capsule Take 100 mg by mouth at bedtime.    Marland Kitchen doxepin (SINEQUAN) 10 MG capsule Take 1 capsule (10 mg total) by mouth at bedtime. 30 capsule 3  . doxepin (SINEQUAN) 10 MG capsule Take 3 capsules one time per day in the evening. 90 capsule 1  . EPIPEN 2-PAK 0.3 MG/0.3ML SOAJ injection Use as directed for life threatening allergic reactions 2 Device 3  . furosemide (  LASIX) 20 MG tablet Take 20 mg by mouth daily.    Marland Kitchen gabapentin (NEURONTIN) 100 MG capsule Take 100 mg by mouth daily.    . mometasone (ELOCON) 0.1 % ointment Apply topically daily. 45 g 3  . montelukast (SINGULAIR) 10 MG tablet Take 1 tablet (10 mg total) by mouth at bedtime. 30 tablet 5  . ranitidine (ZANTAC) 300 MG tablet Take 1 tablet (300 mg total) by mouth at bedtime. 30 tablet 3  . rOPINIRole (REQUIP) 0.5 MG tablet Take 0.5 mg by mouth daily.    . traMADol (ULTRAM) 50 MG tablet Take 1-2 tablets (50-100 mg total) by mouth every 6 (six) hours as needed for moderate pain. (Patient taking differently: Take 50 mg by mouth every 6 (six) hours as needed for moderate pain. ) 30 tablet 0  . umeclidinium-vilanterol (ANORO  ELLIPTA) 62.5-25 MCG/INH AEPB Inhale 1 puff into the lungs daily.    Arvid Right 150 MG injection      Current Facility-Administered Medications  Medication Dose Route Frequency Provider Last Rate Last Dose  . omalizumab Arvid Right) injection 300 mg  300 mg Subcutaneous Q28 days Jiles Prows, MD   300 mg at 08/30/16 1013       Physical Exam: BP 120/83   Pulse 95   Resp 20   Ht '5\' 4"'$  (1.626 m)   Wt 130 lb (59 kg)   SpO2 97%   BMI 22.31 kg/m   General appearance: alert and cooperative Neurologic: intact Heart: regular rate and rhythm, S1, S2 normal, no murmur, click, rub or gallop Lungs: diminished breath sounds bibasilar Abdomen: soft, non-tender; bowel sounds normal; no masses,  no organomegaly Extremities: extremities normal, atraumatic, no cyanosis or edema and Homans sign is negative, no sign of DVT Wound: well healed Patient has no cervical or supraclavicular adenopathy  Has rash on trunk and extremities   Diagnostic Studies & Laboratory data:     Recent Radiology Findings:   Stable ct scan done [place] 03/2016   CT scan was done in May 2017 in [place] -there was no evidence of recurrent disease   Recent Lab Findings: Lab Results  Component Value Date   WBC 5.8 04/18/2015   HGB 7.6* 04/18/2015   HCT 23.9* 04/18/2015   PLT 334 04/18/2015   GLUCOSE 97 05/04/2015   CHOL  04/19/2009    190        ATP III CLASSIFICATION:  <200     mg/dL   Desirable  200-239  mg/dL   Borderline High  >=240    mg/dL   High        SLIGHT HEMOLYSIS   TRIG 116 SLIGHT HEMOLYSIS 04/19/2009   HDL 36 SLIGHT HEMOLYSIS* 04/19/2009   LDLCALC * 04/19/2009    131        Total Cholesterol/HDL:CHD Risk Coronary Heart Disease Risk Table                     Men   Women  1/2 Average Risk   3.4   3.3  Average Risk       5.0   4.4  2 X Average Risk   9.6   7.1  3 X Average Risk  23.4   11.0        Use the calculated Patient Ratio above and the CHD Risk Table to determine the patient's CHD  Risk.        ATP III CLASSIFICATION (LDL):  <100     mg/dL  Optimal  100-129  mg/dL   Near or Above                    Optimal  130-159  mg/dL   Borderline  160-189  mg/dL   High  >190     mg/dL   Very High   ALT 10* 04/18/2015   AST 17 04/18/2015   NA 139 05/04/2015   K 4.1 05/04/2015   CL 101 05/04/2015   CREATININE 0.50 05/04/2015   BUN 12 05/04/2015   CO2 27 05/04/2015   TSH 1.539 Test methodology is 3rd generation TSH 04/19/2009   INR 1.15 04/18/2015      Assessment / Plan:    Patient doing well following resection of stage II a, non-small cell carcinoma. She remains tobacco free Plan to see her back in 6 months, she has a follow-up CT scan ordered in [place] in March 2018 . To see dermatology tomorrow for rash    Grace Isaac MD      [address] [place] [address] Office [phone]   Murlean Hark [phone]  09/04/2016 2:57 PM      Patient ID: Michelle King, female   DOB: 10/20/1960, 56 y.o.   MRN: [id number]

## 2016-09-05 DIAGNOSIS — T783XXA Angioneurotic edema, initial encounter: Secondary | ICD-10-CM

## 2016-09-05 DIAGNOSIS — L501 Idiopathic urticaria: Secondary | ICD-10-CM

## 2016-09-05 DIAGNOSIS — L281 Prurigo nodularis: Secondary | ICD-10-CM

## 2016-09-05 HISTORY — DX: Idiopathic urticaria: L50.1

## 2016-09-05 HISTORY — DX: Angioneurotic edema, initial encounter: T78.3XXA

## 2016-09-05 HISTORY — DX: Prurigo nodularis: L28.1

## 2016-09-26 ENCOUNTER — Telehealth: Payer: Self-pay | Admitting: *Deleted

## 2016-09-26 NOTE — Telephone Encounter (Signed)
Patient called and advised she was going to discontinue her Xolair injections due to no improvement in symptoms after receiving 3 months therapy.  I did advised patient that is does take at least 3 months prior to seeing significant change but she advised that she was still D/C

## 2016-11-02 ENCOUNTER — Ambulatory Visit: Payer: Medicare Other | Admitting: Allergy and Immunology

## 2016-12-04 DIAGNOSIS — C3411 Malignant neoplasm of upper lobe, right bronchus or lung: Secondary | ICD-10-CM | POA: Diagnosis not present

## 2016-12-04 DIAGNOSIS — R599 Enlarged lymph nodes, unspecified: Secondary | ICD-10-CM | POA: Diagnosis not present

## 2017-02-20 ENCOUNTER — Encounter: Payer: Self-pay | Admitting: Cardiology

## 2017-02-20 ENCOUNTER — Ambulatory Visit (INDEPENDENT_AMBULATORY_CARE_PROVIDER_SITE_OTHER): Payer: Medicare Other | Admitting: Cardiology

## 2017-02-20 VITALS — BP 112/83 | HR 93 | Ht 62.0 in | Wt 139.4 lb

## 2017-02-20 DIAGNOSIS — J449 Chronic obstructive pulmonary disease, unspecified: Secondary | ICD-10-CM | POA: Diagnosis not present

## 2017-02-20 DIAGNOSIS — I25119 Atherosclerotic heart disease of native coronary artery with unspecified angina pectoris: Secondary | ICD-10-CM

## 2017-02-20 DIAGNOSIS — E785 Hyperlipidemia, unspecified: Secondary | ICD-10-CM | POA: Diagnosis not present

## 2017-02-20 MED ORDER — EZETIMIBE 10 MG PO TABS: 10.0000 mg | ORAL_TABLET | Freq: Every day | ORAL | 3 refills | Status: DC

## 2017-02-20 NOTE — Patient Instructions (Addendum)
Medication Instructions:  Your physician has recommended you make the following change in your medication:  START ezetimibe (Zetia) 10 mg daily   Labwork: Your physician recommends that you return for lab work in: today. CMP, lipid   Testing/Procedures: You had an EKG today.  Follow-Up: Your physician wants you to follow-up in: 6 months. You will receive a reminder letter in the mail two months in advance. If you don't receive a letter, please call our office to schedule the follow-up appointment.   Any Other Special Instructions Will Be Listed Below (If Applicable).     If you need a refill on your cardiac medications before your next appointment, please call your pharmacy.    Set a goal to exercise 150 minutes a week         Mediterranean diet: A heart-healthy eating plan The heart-healthy Mediterranean diet is a healthy eating plan based on typical foods and recipes of Mediterranean-style cooking. Here's how to adopt the Mediterranean diet. By [facility] Staff  If you're looking for a heart-healthy eating plan, the Mediterranean diet might be right for you. The Mediterranean diet incorporates the basics of healthy eating - plus a splash of flavorful olive oil and perhaps a glass of red wine - among other components characterizing the traditional cooking style of countries bordering the [employer]. Most healthy diets include fruits, vegetables, fish and whole grains, and limit unhealthy fats. While these parts of a healthy diet are tried-and-true, subtle variations or differences in proportions of certain foods may make a difference in your risk of heart disease.  Benefits of the [place] has shown that the traditional Mediterranean diet reduces the risk of heart disease. The diet has been associated with a lower level of oxidized low-density lipoprotein (LDL) cholesterol - the "bad" cholesterol that's more likely to build up deposits in your  arteries. In fact, a meta-analysis of more than 1.5 million healthy adults demonstrated that following a Mediterranean diet was associated with a reduced risk of cardiovascular mortality as well as overall mortality. The Mediterranean diet is also associated with a reduced incidence of cancer, and Parkinson's and Alzheimer's diseases. Women who eat a Mediterranean diet supplemented with extra-virgin olive oil and mixed nuts may have a reduced risk of breast cancer. For these reasons, most if not all major scientific organizations encourage healthy adults to adapt a style of eating like that of the [place] for prevention of major chronic diseases.  Key components of the Mediterranean diet The Mediterranean diet emphasizes: Eating primarily plant-based foods, such as fruits and vegetables, whole grains, legumes and nuts  Replacing butter with healthy fats such as olive oil and canola oil  Using herbs and spices instead of salt to flavor foods  Limiting red meat to no more than a few times a month  Eating fish and poultry at least twice a week  Enjoying meals with family and friends  Drinking red wine in moderation (optional)  Getting plenty of exercise Fruits, vegetables, nuts and grains  The Mediterranean diet traditionally includes fruits, vegetables, pasta and rice. For example, residents of [place] eat very little red meat and average nine servings a day of antioxidant-rich fruits and vegetables.  Grains in the [place] region are typically whole grain and usually contain very few unhealthy trans fats, and bread is an important part of the diet there. However, throughout the [place] region, bread is eaten plain or dipped in olive oil - not eaten with butter or margarines, which  contain saturated or trans fats.  Nuts are another part of a healthy Mediterranean diet. Nuts are high in fat (approximately 80 percent of their calories come from fat), but most of the fat is not  saturated. Because nuts are high in calories, they should not be eaten in large amounts - generally no more than a handful a day. Avoid candied or honey-roasted and heavily salted nuts.  Healthy fats The focus of the Mediterranean diet isn't on limiting total fat consumption, but rather to make wise choices about the types of fat you eat. The Mediterranean diet discourages saturated fats and hydrogenated oils (trans fats), both of which contribute to heart disease. The Mediterranean diet features olive oil as the primary source of fat. Olive oil provides monounsaturated fat - a type of fat that can help reduce LDL cholesterol levels when used in place of saturated or trans fats.  "Extra-virgin" and "virgin" olive oils - the least processed forms - also contain the highest levels of the protective plant compounds that provide antioxidant effects. Monounsaturated fats and polyunsaturated fats, such as canola oil and some nuts, contain the beneficial linolenic acid (a type of omega-3 fatty acid). Omega-3 fatty acids lower triglycerides, decrease blood clotting, are associated with decreased sudden heart attack, improve the health of your blood vessels, and help moderate blood pressure. Fatty fish - such as mackerel, lake trout, herring, sardines, albacore tuna and salmon - are rich sources of omega-3 fatty acids. Fish is eaten on a regular basis in the [place].  Wine The health effects of alcohol have been debated for many years, and some doctors are reluctant to encourage alcohol consumption because of the health consequences of excessive drinking. However, alcohol - in moderation - has been associated with a reduced risk of heart disease in some research studies. The Mediterranean diet typically includes a moderate amount of wine. This means no more than 5 ounces (148 milliliters) of wine daily for women (or men over age 4), and no more than 10 ounces (296 milliliters) of wine daily for men  under age 48. If you're unable to limit your alcohol intake to the amounts defined above, if you have a personal or family history of alcohol abuse, or if you have heart or liver disease, refrain from drinking wine or any other alcohol.  Putting it all together The Mediterranean diet is a delicious and healthy way to eat. Many people who switch to this style of eating say they'll never eat any other way. Here are some specific steps to get you started: Eat your veggies and fruits - and switch to whole grains. An abundance and variety of plant foods should make up the majority of your meals. Strive for seven to 10 servings a day of veggies and fruits. Switch to whole-grain bread and cereal, and begin to eat more whole-grain rice and pasta products.  Go nuts. Keep almonds, cashews, pistachios and walnuts on hand for a quick snack. Choose natural peanut butter, rather than the kind with hydrogenated fat added. Try tahini (blended sesame seeds) as a dip or spread for bread.  Pass on the butter. Try olive or canola oil as a healthy replacement for butter or margarine. Use it in cooking. Dip bread in flavored olive oil or lightly spread it on whole-grain bread for a tasty alternative to butter. Or try tahini as a dip or spread.  Spice it up. Herbs and spices make food tasty and are also rich in health-promoting substances. Season your  meals with herbs and spices rather than salt.  Go fish. Eat fish once or twice a week. Fresh or water-packed tuna, salmon, trout, mackerel and herring are healthy choices. Grilled fish tastes good and requires little cleanup. Avoid fried fish, unless it's sauteed in a small amount of canola oil.  Rein in the red meat. Substitute fish and poultry for red meat. When eaten, make sure it's lean and keep portions small (about the size of a deck of cards). Also avoid sausage, bacon and other high-fat meats.  Choose low-fat dairy. Limit higher fat dairy products such as whole or 2 percent  milk, cheese and ice cream. Switch to skim milk, fat-free yogurt and low-fat cheese.  Raise a glass to healthy eating. If it's OK with your doctor, have a glass of wine at dinner. If you don't drink alcohol, you don't need to start. Drinking purple grape juice may be an alternative to wine.

## 2017-02-20 NOTE — Progress Notes (Signed)
Cardiology Office Note:    Date:  02/20/2017   ID:  Michelle King, DOB 10-11-1960, MRN [id number]  PCP:  Charlotte Sanes, MD  Cardiologist:  Shirlee More, MD    Referring MD: Charlotte Sanes, MD    ASSESSMENT:    1. Coronary artery disease involving native coronary artery of native heart with angina pectoris ([place])   2. Chronic obstructive pulmonary disease, unspecified COPD type ([place])   3. Hyperlipidemia, unspecified hyperlipidemia type    PLAN:    In order of problems listed above:  1. Stable course after revascularization, I've asked her to continue her home exercise program with walking has been remarkably effective in increasing her exercise tolerance and dual antiplatelet therapy aspirin and clopidogrel as well as starting an non-statin for dyslipidemia as she was incapable of qualifying her for doing antibody therapy statin intolerant 2. Improved continue current treatment managed by her PCP 3. Poorly controlled check baseline lipids start therapy with Zetia and she is absolutely statin intolerant   Next appointment: 6 months   Medication Adjustments/Labs and Tests Ordered: Current medicines are reviewed at length with the patient today.  Concerns regarding medicines are outlined above.  Orders Placed This Encounter  Procedures  . Lipid Panel w/o Chol/HDL Ratio  . Comprehensive Metabolic Panel (CMET)  . EKG 12-Lead   Meds ordered this encounter  Medications  . ezetimibe (ZETIA) 10 MG tablet    Sig: Take 1 tablet (10 mg total) by mouth daily.    Dispense:  90 tablet    Refill:  3    Chief Complaint  Patient presents with  . Follow-up    Routine flup app  for CAD  . Shortness of Breath    History of Present Illness:    Michelle King is a 56 y.o. female with a hx of STEMI, S/P PCI of proximal LAD, CAD Dyslipidemia , severe COPD and lung cancer  last seen In December 2017 since then she has been seen by pulmonary treated in her  shortness of breath is markedly improved. She now can do light housework and climb stairs without limiting shortness of breath. She no longer smokes. She's had no angina palpitations orthopnea syncope or TIA.Marland Kitchen Compliance with diet, lifestyle and medications: Yes Past Medical History:  Diagnosis Date  . Angio-edema 09/05/2016  . CAD (coronary artery disease)   . Cancer ([place])    lung  . Chronic idiopathic urticaria 09/05/2016  . Complication of anesthesia   . COPD (chronic obstructive pulmonary disease) ([place])   . Coronary artery disease involving native coronary artery of native heart with angina pectoris [facility])    Overview:  Anterior MI 2010 with PCI and stent Her last stress test pharmacologic showed normal ejection fraction and no evidence of ischemia. She has a history of previous LAD infarction with severely depressed ejection fraction that normalized with medical therapy and revascularization percutaneously.  . Family history of colon cancer   . Hematochezia 04/14/2015  . History of bilateral tubal ligation   . Hyperlipidemia   . Lung cancer-right upper lobe 11/15/2014  . Myocardial infarction ([place]) 2010  . Obstructive chronic bronchitis without exacerbation ([place]) 03/01/2015  . Pneumonia    hx  . PONV (postoperative nausea and vomiting)   . Preoperative cardiovascular examination 03/02/2015  . 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 Pure hypercholesterolemia 03/01/2015  . S/P hysterectomy   . S/P lobectomy of lung 03/25/2015  . Shortness of breath dyspnea     Past Surgical History:  Procedure Laterality Date  . APPENDECTOMY    . CARDIAC CATHETERIZATION     stents  . COLONOSCOPY N/A 04/18/2015   Procedure: COLONOSCOPY;  Surgeon: Ladene Artist, MD;  Location: [facility] ENDOSCOPY;  Service: Endoscopy;  Laterality: N/A;  . MOUTH SURGERY     teeth  . TUBAL LIGATION    . VIDEO ASSISTED THORACOSCOPY (VATS)/WEDGE RESECTION Right 03/25/2015   Procedure: VIDEO ASSISTED THORACOSCOPY (VATS)/LUNG RESECTION;   Surgeon: Grace Isaac, MD;  Location: [place];  Service: Thoracic;  Laterality: Right;  Marland Kitchen VIDEO BRONCHOSCOPY N/A 03/25/2015   Procedure: VIDEO BRONCHOSCOPY;  Surgeon: Grace Isaac, MD;  Location: [facility] OR;  Service: Thoracic;  Laterality: N/A;  . VIDEO BRONCHOSCOPY WITH INSERTION OF INTERBRONCHIAL VALVE (IBV) N/A 04/12/2015   Procedure: VIDEO BRONCHOSCOPY WITH INSERTION OF 7MM INTERBRONCHIAL VALVES (IBV) IN SUPERIOR SEGMENT, MEDIAL AND POSTERIOR SEGMENT, AND LATERAL SEGMENT OF RIGHT LOWER LOBE;  Surgeon: Grace Isaac, MD;  Location: [facility] OR;  Service: Thoracic;  Laterality: N/A;  31mm Spiration Valve placed in Superior Segment, Medial and Posterior Segment, and Lateral Segment of Right Lower Lobe  . VIDEO BRONCHOSCOPY WITH INSERTION OF INTERBRONCHIAL VALVE (IBV) Right 04/19/2015   Procedure: VIDEO BRONCHOSCOPY WITH INSERTION OF INTERBRONCHIAL VALVE (IBV);  Surgeon: Grace Isaac, MD;  Location: [place];  Service: Thoracic;  Laterality: Right;  Marland Kitchen VIDEO BRONCHOSCOPY WITH INSERTION OF INTERBRONCHIAL VALVE (IBV) N/A 06/03/2015   Procedure: VIDEO BRONCHOSCOPY WITH REMOVAL OF INTERBRONCHIAL VALVE (IBV);  Surgeon: Grace Isaac, MD;  Location: [facility] OR;  Service: Thoracic;  Laterality: N/A;    Current Medications: Current Meds  Medication Sig  . aspirin EC 81 MG tablet Take 81 mg by mouth daily.  . cetirizine (ZYRTEC) 10 MG tablet Take 1 tablet (10 mg total) by mouth daily.  . clopidogrel (PLAVIX) 75 MG tablet Take 75 mg by mouth daily.  . cycloSPORINE modified (NEORAL) 25 MG capsule Take 50 mg by mouth 2 (two) times daily.  Marland Kitchen docusate sodium (COLACE) 100 MG capsule Take 100 mg by mouth at bedtime.  . gabapentin (NEURONTIN) 100 MG capsule Take 200 mg by mouth 2 (two) times daily.   . mometasone (ELOCON) 0.1 % ointment Apply topically daily.  . traMADol (ULTRAM) 50 MG tablet Take 1-2 tablets (50-100 mg total) by mouth every 6 (six) hours as needed for moderate pain. (Patient taking differently:  Take 50 mg by mouth every 6 (six) hours as needed for moderate pain. )  . umeclidinium-vilanterol (ANORO ELLIPTA) 62.5-25 MCG/INH AEPB Inhale 1 puff into the lungs daily as needed (wheezing).   . [DISCONTINUED] montelukast (SINGULAIR) 10 MG tablet Take 1 tablet (10 mg total) by mouth at bedtime. (Patient taking differently: Take 10 mg by mouth daily as needed. )   Current Facility-Administered Medications for the 02/20/17 encounter (Office Visit) with Richardo Priest, MD  Medication  . omalizumab Arvid Right) injection 300 mg     Allergies:   Codeine; Rosuvastatin; and Sympathomimetics   Social History   Social History  . Marital status: Married    Spouse name: N/A  . Number of children: N/A  . Years of education: N/A   Social History Main Topics  . Smoking status: Former Smoker    Packs/day: 0.50    Years: 40.00    Types: Cigarettes    Quit date: 03/25/2015  . Smokeless tobacco: Never Used     Comment: down to 4 aday  . Alcohol use No  . Drug use: No  . Sexual  activity: Not Asked   Other Topics Concern  . None   Social History Narrative  . None     Family History: The patient's family history includes Cancer in her brother, father, and sister; Cerebrovascular Disease in her brother and brother; Diabetes in her mother; Heart disease in her brother and brother; Stroke in her sister and sister. ROS:   Please see the history of present illness.    All other systems reviewed and are negative.  EKGs/Labs/Other Studies Reviewed:    The following studies were reviewed today:  EKG:  EKG ordered today.  The ekg ordered today demonstrates Sinus rhythm old septal MI  Cardiac cath 06/01/16: Conclusions Diagnostic Procedure Summary 1. Mild non-obstructive CAD. Prior LAD stent with mild instent restenosis 2. Normal LV systolic function, LVEDP 17 mmHg. Pt requested right femoral approach Diagnostic Procedure Recommendations 1. Medical treatment. Her symptom is mainly SOB, with elev  LVEDP, will start Lasix 20 mg daily and KCL 20 meq daily. Follow up with Dr. Bettina Gavia Assessment & Plan:  Recent Labs: 06/14/2016: ALT 10; BUN 14; Creatinine, Ser 0.59; Potassium 4.5; Sodium 144; TSH 1.740 08/10/2016: Hemoglobin 10.2; Platelets 413  Recent Lipid Panel    Component Value Date/Time   CHOL  04/19/2009 1130    190        ATP III CLASSIFICATION:  <200     mg/dL   Desirable  200-239  mg/dL   Borderline High  >=240    mg/dL   High        SLIGHT HEMOLYSIS   TRIG 116 SLIGHT HEMOLYSIS 04/19/2009 1130   HDL 36 SLIGHT HEMOLYSIS (L) 04/19/2009 1130   CHOLHDL 5.3 04/19/2009 1130   VLDL 23 04/19/2009 1130   LDLCALC (H) 04/19/2009 1130    131        Total Cholesterol/HDL:CHD Risk Coronary Heart Disease Risk Table                     Men   Women  1/2 Average Risk   3.4   3.3  Average Risk       5.0   4.4  2 X Average Risk   9.6   7.1  3 X Average Risk  23.4   11.0        Use the calculated Patient Ratio above and the CHD Risk Table to determine the patient's CHD Risk.        ATP III CLASSIFICATION (LDL):  <100     mg/dL   Optimal  100-129  mg/dL   Near or Above                    Optimal  130-159  mg/dL   Borderline  160-189  mg/dL   High  >190     mg/dL   Very High    Physical Exam:    VS:  BP 112/83 (BP Location: Right Arm, Patient Position: Sitting)   Pulse 93   Ht 5\' 2"  (1.575 m)   Wt 139 lb 6.4 oz (63.2 kg)   SpO2 97%   BMI 25.50 kg/m     Wt Readings from Last 3 Encounters:  02/20/17 139 lb 6.4 oz (63.2 kg)  09/04/16 130 lb (59 kg)  06/14/16 126 lb (57.2 kg)     GEN: She has a COPD appearance Well nourished, well developed in no acute distress HEENT: Normal NECK: No JVD; No carotid bruits LYMPHATICS: No lymphadenopathy CARDIAC: Distant heart sounds RRR, no murmurs,  rubs, gallops RESPIRATORY:  Clear to auscultation without rales, wheezing or rhonchi  ABDOMEN: Soft, non-tender, non-distended MUSCULOSKELETAL:  No edema; No deformity  SKIN: Warm and  dry NEUROLOGIC:  Alert and oriented x 3 PSYCHIATRIC:  Normal affect    Signed, Shirlee More, MD  02/20/2017 12:18 PM    [facility]

## 2017-02-21 LAB — COMPREHENSIVE METABOLIC PANEL
A/G RATIO: 2.1 (ref 1.2–2.2)
ALBUMIN: 4.7 g/dL (ref 3.5–5.5)
ALT: 11 IU/L (ref 0–32)
AST: 16 IU/L (ref 0–40)
Alkaline Phosphatase: 92 IU/L (ref 39–117)
BILIRUBIN TOTAL: 0.4 mg/dL (ref 0.0–1.2)
BUN/Creatinine Ratio: 31 — ABNORMAL HIGH (ref 9–23)
BUN: 15 mg/dL (ref 6–24)
CO2: 24 mmol/L (ref 20–29)
Calcium: 9.8 mg/dL (ref 8.7–10.2)
Chloride: 101 mmol/L (ref 96–106)
Creatinine, Ser: 0.49 mg/dL — ABNORMAL LOW (ref 0.57–1.00)
GFR calc non Af Amer: 109 mL/min/{1.73_m2} (ref >59)
GFR, EST AFRICAN AMERICAN: 125 mL/min/{1.73_m2} (ref >59)
GLOBULIN, TOTAL: 2.2 g/dL (ref 1.5–4.5)
Glucose: 90 mg/dL (ref 65–99)
Potassium: 4.3 mmol/L (ref 3.5–5.2)
Sodium: 142 mmol/L (ref 134–144)
TOTAL PROTEIN: 6.9 g/dL (ref 6.0–8.5)

## 2017-02-21 LAB — LIPID PANEL W/O CHOL/HDL RATIO
CHOLESTEROL TOTAL: 262 mg/dL — AB (ref 100–199)
HDL: 45 mg/dL (ref >39)
LDL Calculated: 182 mg/dL — ABNORMAL HIGH (ref 0–99)
Triglycerides: 177 mg/dL — ABNORMAL HIGH (ref 0–149)
VLDL Cholesterol Cal: 35 mg/dL (ref 5–40)

## 2017-03-05 ENCOUNTER — Encounter: Payer: Medicare Other | Admitting: Cardiothoracic Surgery

## 2017-03-06 ENCOUNTER — Encounter: Payer: Medicare Other | Admitting: Cardiothoracic Surgery

## 2017-03-07 ENCOUNTER — Ambulatory Visit (INDEPENDENT_AMBULATORY_CARE_PROVIDER_SITE_OTHER): Payer: Medicare Other | Admitting: Cardiothoracic Surgery

## 2017-03-07 ENCOUNTER — Encounter: Payer: Self-pay | Admitting: Cardiothoracic Surgery

## 2017-03-07 ENCOUNTER — Encounter: Payer: Medicare Other | Admitting: Cardiothoracic Surgery

## 2017-03-07 VITALS — BP 119/83 | HR 72 | Resp 16 | Ht 62.0 in | Wt 140.0 lb

## 2017-03-07 DIAGNOSIS — C3411 Malignant neoplasm of upper lobe, right bronchus or lung: Secondary | ICD-10-CM | POA: Diagnosis not present

## 2017-03-07 DIAGNOSIS — Z902 Acquired absence of lung [part of]: Secondary | ICD-10-CM | POA: Diagnosis not present

## 2017-03-07 DIAGNOSIS — I25119 Atherosclerotic heart disease of native coronary artery with unspecified angina pectoris: Secondary | ICD-10-CM | POA: Diagnosis not present

## 2017-03-07 NOTE — Progress Notes (Signed)
Michelle King       [place] [address]             [phone]      Alessandra Darlene Wood [place] [facility] Medical Record #[id number] Date of Birth: May 01, 1961  Referring: Marice Potter, MD Primary Care: Charlotte Sanes, MD  Chief Complaint:   POST OP FOLLOW UP  Lung cancer-right upper lobe   Staging form: Lung, AJCC 7th Edition     Clinical: No stage assigned - Unsigned     Pathologic stage from 03/29/2015: Stage IIA (yT1b, N1, cM0) - Unsigned  03/25/2015  OPERATIVE REPORT PREOPERATIVE DIAGNOSIS: Adenocarcinoma, right upper lobe, status post chemotherapy. POSTOPERATIVE DIAGNOSIS: Adenocarcinoma, right upper lobe, status post chemotherapy. SURGICAL PROCEDURE: 1. Video bronchoscopy. 2. Right video-assisted thoracoscopy with right upper lobectomy. 3. Lymph node dissection. 4. Placement of ON-Q. SURGEON: Lanelle Bal, M.D  History of Present Illness:     Patient returns to the office today in follow-up after right upper lobectomy for adenocarcinoma the lung in September 2016. The patient did have a follow-up CT scan done in [place] several months ago the suggested mild enlargement of mediastinal nodes. She's scheduled to have a repeat scan in September. She remains off cigarettes  Past Medical History:  Diagnosis Date  . Angio-edema 09/05/2016  . CAD (coronary artery disease)   . Cancer ([place])    lung  . Chronic idiopathic urticaria 09/05/2016  . Complication of anesthesia   . COPD (chronic obstructive pulmonary disease) ([place])   . Coronary artery disease involving native coronary artery of native heart with angina pectoris [facility])    Overview:  Anterior MI 2010 with PCI and stent Her last stress test pharmacologic showed normal ejection fraction and no evidence of ischemia. She has a history of previous LAD infarction with severely depressed ejection fraction that normalized with medical therapy and revascularization percutaneously.  . Family  history of colon cancer   . Hematochezia 04/14/2015  . History of bilateral tubal ligation   . Hyperlipidemia   . Lung cancer-right upper lobe 11/15/2014  . Myocardial infarction ([place]) 2010  . Obstructive chronic bronchitis without exacerbation ([place]) 03/01/2015  . Pneumonia    hx  . PONV (postoperative nausea and vomiting)   . Preoperative cardiovascular examination 03/02/2015  . 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 Pure hypercholesterolemia 03/01/2015  . S/P hysterectomy   . S/P lobectomy of lung 03/25/2015  . Shortness of breath dyspnea      History  Smoking Status  . Former Smoker  . Packs/day: 0.50  . Years: 40.00  . Types: Cigarettes  . Quit date: 03/25/2015  Smokeless Tobacco  . Never Used    Comment: down to 4 aday    History  Alcohol Use No     Allergies  Allergen Reactions  . Codeine Itching and Swelling    Facial swelling Tolerates norco  . Rosuvastatin Other (See Comments)    Nose bleeds  . Sympathomimetics Other (See Comments)    Pt was not told what the reaction was    Current Outpatient Prescriptions  Medication Sig Dispense Refill  . aspirin EC 81 MG tablet Take 81 mg by mouth daily.    . cetirizine (ZYRTEC) 10 MG tablet Take 1 tablet (10 mg total) by mouth daily. 30 tablet 5  . clopidogrel (PLAVIX) 75 MG tablet Take 75 mg by mouth daily.    . cycloSPORINE modified (NEORAL) 25 MG capsule Take 50 mg by mouth 2 (two) times  daily.    . docusate sodium (COLACE) 100 MG capsule Take 100 mg by mouth at bedtime.    Marland Kitchen ezetimibe (ZETIA) 10 MG tablet Take 1 tablet (10 mg total) by mouth daily. 90 tablet 3  . gabapentin (NEURONTIN) 100 MG capsule Take 200 mg by mouth 2 (two) times daily.     . mometasone (ELOCON) 0.1 % ointment Apply topically daily. 45 g 3  . traMADol (ULTRAM) 50 MG tablet Take 1-2 tablets (50-100 mg total) by mouth every 6 (six) hours as needed for moderate pain. (Patient taking differently: Take 50 mg by mouth every 6 (six) hours as needed for moderate  pain. ) 30 tablet 0  . umeclidinium-vilanterol (ANORO ELLIPTA) 62.5-25 MCG/INH AEPB Inhale 1 puff into the lungs daily as needed (wheezing).      Current Facility-Administered Medications  Medication Dose Route Frequency Provider Last Rate Last Dose  . omalizumab Arvid Right) injection 300 mg  300 mg Subcutaneous Q28 days Jiles Prows, MD   300 mg at 08/30/16 1013       Physical Exam: BP 119/83 (BP Location: Right Arm, Patient Position: Sitting, Cuff Size: Large)   Pulse 72   Resp 16   Ht 5\' 2"  (1.575 m)   Wt 140 lb (63.5 kg)   SpO2 98% Comment: ON RA  BMI 25.61 kg/m   Physical Exam  Constitutional: She is oriented to person, place, and time. No distress.  HENT:  Mouth/Throat: No oropharyngeal exudate.  Neck: No JVD present. No tracheal deviation present. No thyromegaly present.  Cardiovascular: Normal rate and regular rhythm.  Exam reveals no gallop and no friction rub.   No murmur heard. Pulmonary/Chest: Effort normal. No stridor. No respiratory distress. She has no wheezes. She has no rales. She exhibits no tenderness.  Abdominal: Soft. Bowel sounds are normal.  Musculoskeletal: She exhibits no edema or deformity.  Lymphadenopathy:    She has no cervical adenopathy.  Neurological: She is alert and oriented to person, place, and time.  Skin: Skin is warm and dry. She is not diaphoretic. No erythema. No pallor.  Psychiatric: She has a normal mood and affect. Her behavior is normal. Judgment and thought content normal.     Diagnostic Studies & Laboratory data:     Recent Radiology Findings:  Final Report  CLINICAL DATA: Lung cancer diagnosed 03/24/2016. Chemotherapy completed. Right upper lobectomy. Prior cardiac stents.  EXAM: CT CHEST WITH CONTRAST  TECHNIQUE: Multidetector CT imaging of the chest was performed during intravenous contrast administration.  CONTRAST: 60 cc Isovue 370  COMPARISON: Plain films 07/27/2016. CT 03/27/2016.  FINDINGS: Cardiovascular:  Aortic and branch vessel atherosclerosis. Borderline cardiomegaly, without pericardial effusion. Lad coronary artery stent. Multivessel coronary artery atherosclerosis. No central pulmonary embolism, on this non-dedicated study.  Mediastinum/Nodes: No supraclavicular adenopathy. Right paratracheal node measures 9 mm on image 30/series 2 and is new.  Subcarinal node measures 6 mm on image 42/series 2 versus 4 mm on the prior.  Lateral AP window node measures 10 mm on image 35/series 2 versus 7 mm on the prior.  A prevascular node measures 6 mm on image 33/series 2 versus 4 mm on the prior.  Lungs/Pleura: No pleural fluid. Moderate centrilobular emphysema.  New right lower lobe clustered ill-defined peribronchovascular nodularity, including on image 72/series 4. Mild volume loss at the left lung base.  Upper Abdomen: Possible mild hepatic steatosis. Normal imaged portions of the spleen, stomach, pancreas, gallbladder, biliary tract, adrenal glands, kidneys. Abdominal aortic atherosclerosis.  Musculoskeletal: No acute  osseous abnormality.  IMPRESSION: 1. Status post right upper lobectomy. 2. New clustered nodules in the right lower lobe are suspicious for infection, including atypical etiologies. Correlate with infectious symptoms. 3. Enlarging thoracic nodes. Although none are pathologic by size criteria, nodal metastasis cannot be excluded. Given possible infection, the could these could alternatively be infectious. Potential clinical strategies include antibiotic therapy and follow-up at 6 weeks with CT versus further characterization with PET. 4. Coronary artery atherosclerosis. Aortic atherosclerosis.   Electronically Signed By: Abigail Miyamoto M.D. On: 11/26/2016 13:59    Recent Lab Findings: Lab Results  Component Value Date   WBC 5.8 04/18/2015   HGB 7.6* 04/18/2015   HCT 23.9* 04/18/2015   PLT 334 04/18/2015   GLUCOSE 97 05/04/2015   CHOL  04/19/2009    190         ATP III CLASSIFICATION:  <200     mg/dL   Desirable  200-239  mg/dL   Borderline High  >=240    mg/dL   High        SLIGHT HEMOLYSIS   TRIG 116 SLIGHT HEMOLYSIS 04/19/2009   HDL 36 SLIGHT HEMOLYSIS* 04/19/2009   LDLCALC * 04/19/2009    131        Total Cholesterol/HDL:CHD Risk Coronary Heart Disease Risk Table                     Men   Women  1/2 Average Risk   3.4   3.3  Average Risk       5.0   4.4  2 X Average Risk   9.6   7.1  3 X Average Risk  23.4   11.0        Use the calculated Patient Ratio above and the CHD Risk Table to determine the patient's CHD Risk.        ATP III CLASSIFICATION (LDL):  <100     mg/dL   Optimal  100-129  mg/dL   Near or Above                    Optimal  130-159  mg/dL   Borderline  160-189  mg/dL   High  >190     mg/dL   Very High   ALT 10* 04/18/2015   AST 17 04/18/2015   NA 139 05/04/2015   K 4.1 05/04/2015   CL 101 05/04/2015   CREATININE 0.50 05/04/2015   BUN 12 05/04/2015   CO2 27 05/04/2015   TSH 1.539 Test methodology is 3rd generation TSH 04/19/2009   INR 1.15 04/18/2015      Assessment / Plan:    Patient doing well following resection of stage II a, non-small cell carcinoma. She remains tobacco free Plan to see her back in 6 months, she has a follow-up CT scan ordered in [place] in September 2018 . I will see her back depending on the CT scan results and if she needs further biopsies, scan in the spring noted mildly enlarging thoracic nodes though none of pathologic size, new clustered nodules in the right lower lobe suggestive of infection.   Grace Isaac MD      [address] [place] [address] Office [phone]   Murlean Hark [phone]  03/07/2017 5:42 PM      Patient ID: Michelle King Casimer Bilis, female   DOB: [birth year], 56 y.o.   MRN: [id number]

## 2017-03-22 ENCOUNTER — Telehealth: Payer: Self-pay | Admitting: Cardiology

## 2017-03-22 ENCOUNTER — Other Ambulatory Visit: Payer: Self-pay

## 2017-03-22 DIAGNOSIS — E7800 Pure hypercholesterolemia, unspecified: Secondary | ICD-10-CM

## 2017-03-22 NOTE — Telephone Encounter (Signed)
Patient advised to have lab work at [facility] in [place]. Patient verbalized understanding of location.

## 2017-03-22 NOTE — Telephone Encounter (Signed)
Wants to know if she can have her labs done locally instead of coming back up here

## 2017-04-04 LAB — LIPID PANEL
Chol/HDL Ratio: 5.2 ratio — ABNORMAL HIGH (ref 0.0–4.4)
Cholesterol, Total: 237 mg/dL — ABNORMAL HIGH (ref 100–199)
HDL: 46 mg/dL (ref >39)
LDL CALC: 155 mg/dL — AB (ref 0–99)
Triglycerides: 181 mg/dL — ABNORMAL HIGH (ref 0–149)
VLDL CHOLESTEROL CAL: 36 mg/dL (ref 5–40)

## 2017-04-05 ENCOUNTER — Other Ambulatory Visit: Payer: Self-pay

## 2017-04-05 DIAGNOSIS — Z85118 Personal history of other malignant neoplasm of bronchus and lung: Secondary | ICD-10-CM | POA: Diagnosis not present

## 2017-04-05 DIAGNOSIS — I2510 Atherosclerotic heart disease of native coronary artery without angina pectoris: Secondary | ICD-10-CM

## 2017-04-05 DIAGNOSIS — Z9221 Personal history of antineoplastic chemotherapy: Secondary | ICD-10-CM | POA: Diagnosis not present

## 2017-04-05 DIAGNOSIS — E7800 Pure hypercholesterolemia, unspecified: Secondary | ICD-10-CM

## 2017-05-15 ENCOUNTER — Other Ambulatory Visit: Payer: Self-pay

## 2017-05-15 ENCOUNTER — Telehealth: Payer: Self-pay | Admitting: Cardiology

## 2017-05-15 MED ORDER — CLOPIDOGREL BISULFATE 75 MG PO TABS
75.0000 mg | ORAL_TABLET | Freq: Every day | ORAL | 10 refills | Status: DC
Start: 2017-05-15 — End: 2018-03-19

## 2017-05-15 NOTE — Telephone Encounter (Signed)
Refill sent.

## 2017-05-15 NOTE — Telephone Encounter (Signed)
°*  STAT* If patient is at the pharmacy, call can be transferred to refill team.   1. Which medications need to be refilled? (please list name of each medication and dose if known) Plavix  2. Which pharmacy/location (including street and city if local pharmacy) is medication to be sent to? [facility] [place]  3. Do they need a 30 day or 90 day supply? [place]

## 2017-08-10 IMAGING — CR DG CHEST 1V PORT
1 series · 1 of 1 positions shown · non-contrast
Comparison: 04/19/2015.

CLINICAL DATA: Chest tube.

EXAM:
PORTABLE CHEST 1 VIEW

[AP]
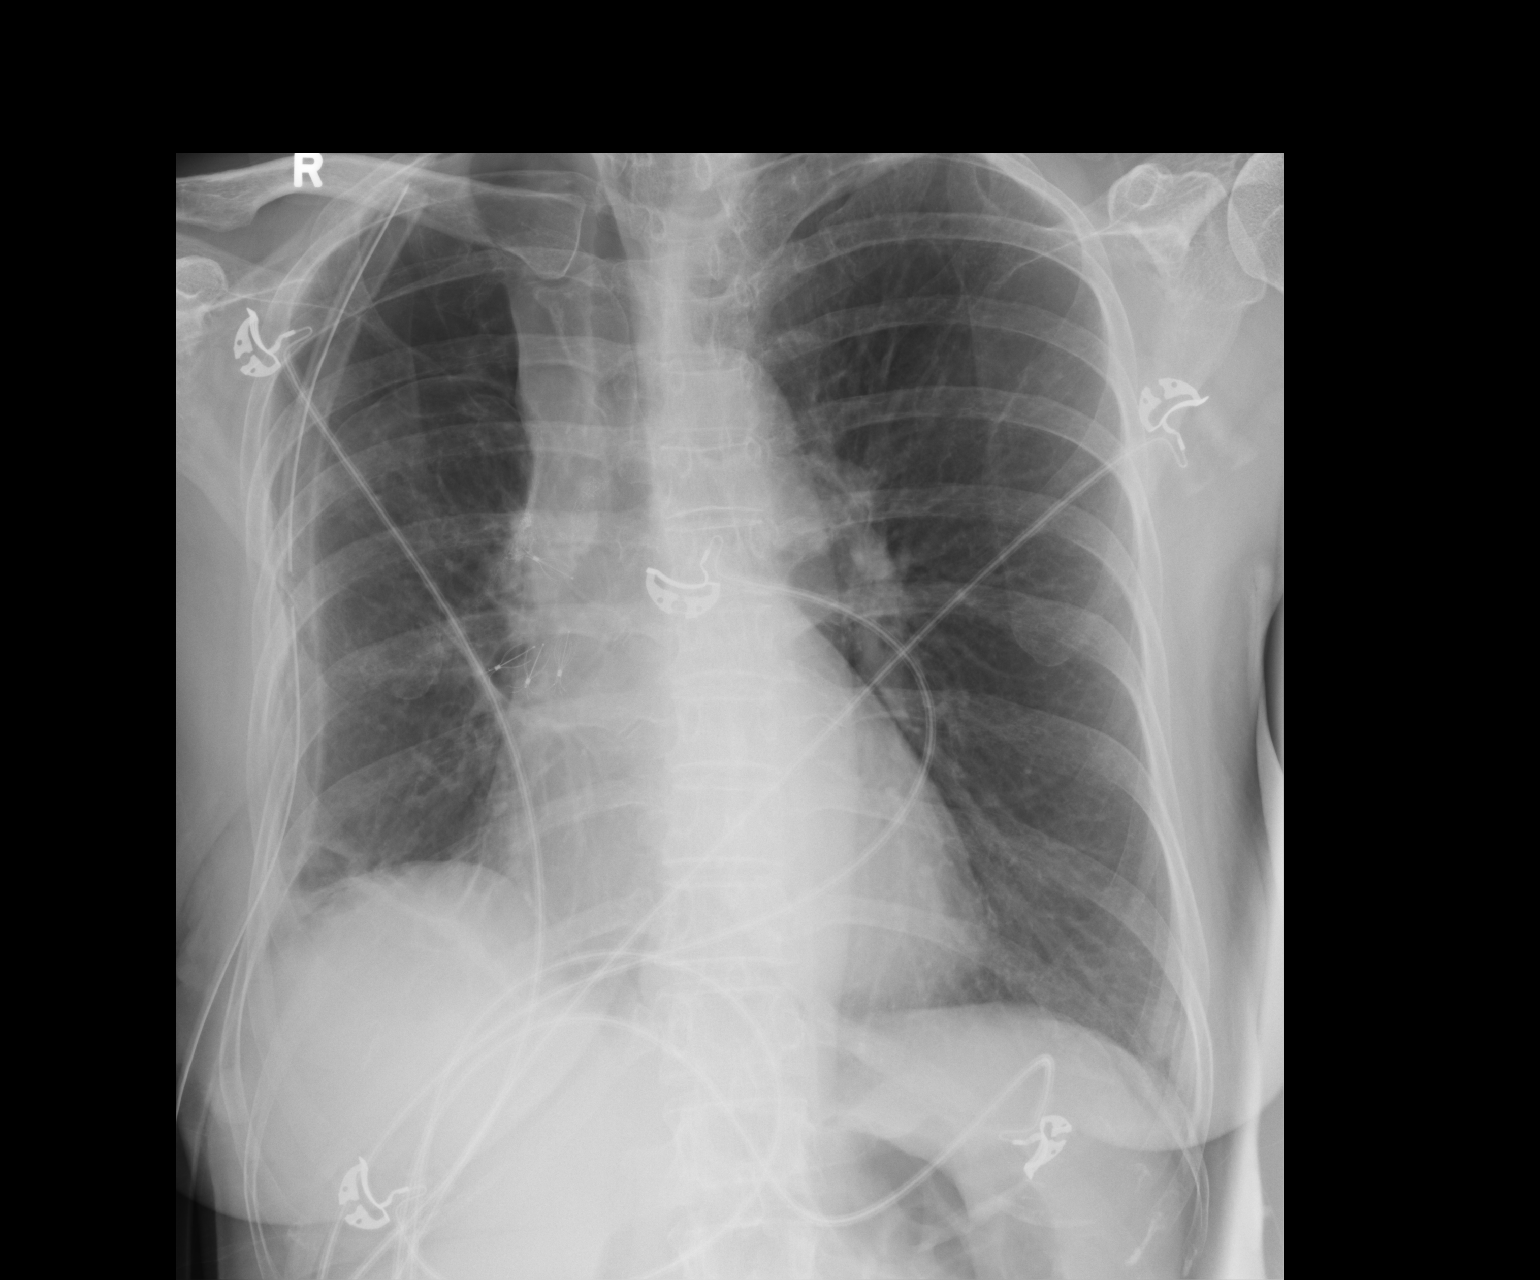

[1 of 1 positions shown; findings below may reference images not displayed]

FINDINGS: Right chest tube in stable position. Stable right pneumothorax.
Postsurgical changes right lung. No focal acute infiltrate. Heart
size stable. No pleural effusion or pneumothorax.
IMPRESSION: 1. Right chest tube in stable position. Stable right pneumothorax.
Postsurgical changes right lung.
2. No acute cardiopulmonary disease otherwise noted.

## 2017-08-12 IMAGING — CR DG CHEST 1V PORT
1 series · 1 of 1 positions shown · non-contrast
Comparison: 04/21/2015

CLINICAL DATA: Air leak status post right upper lobectomy and
status post placement of bronchial valves.

EXAM:
PORTABLE CHEST 1 VIEW

[AP]
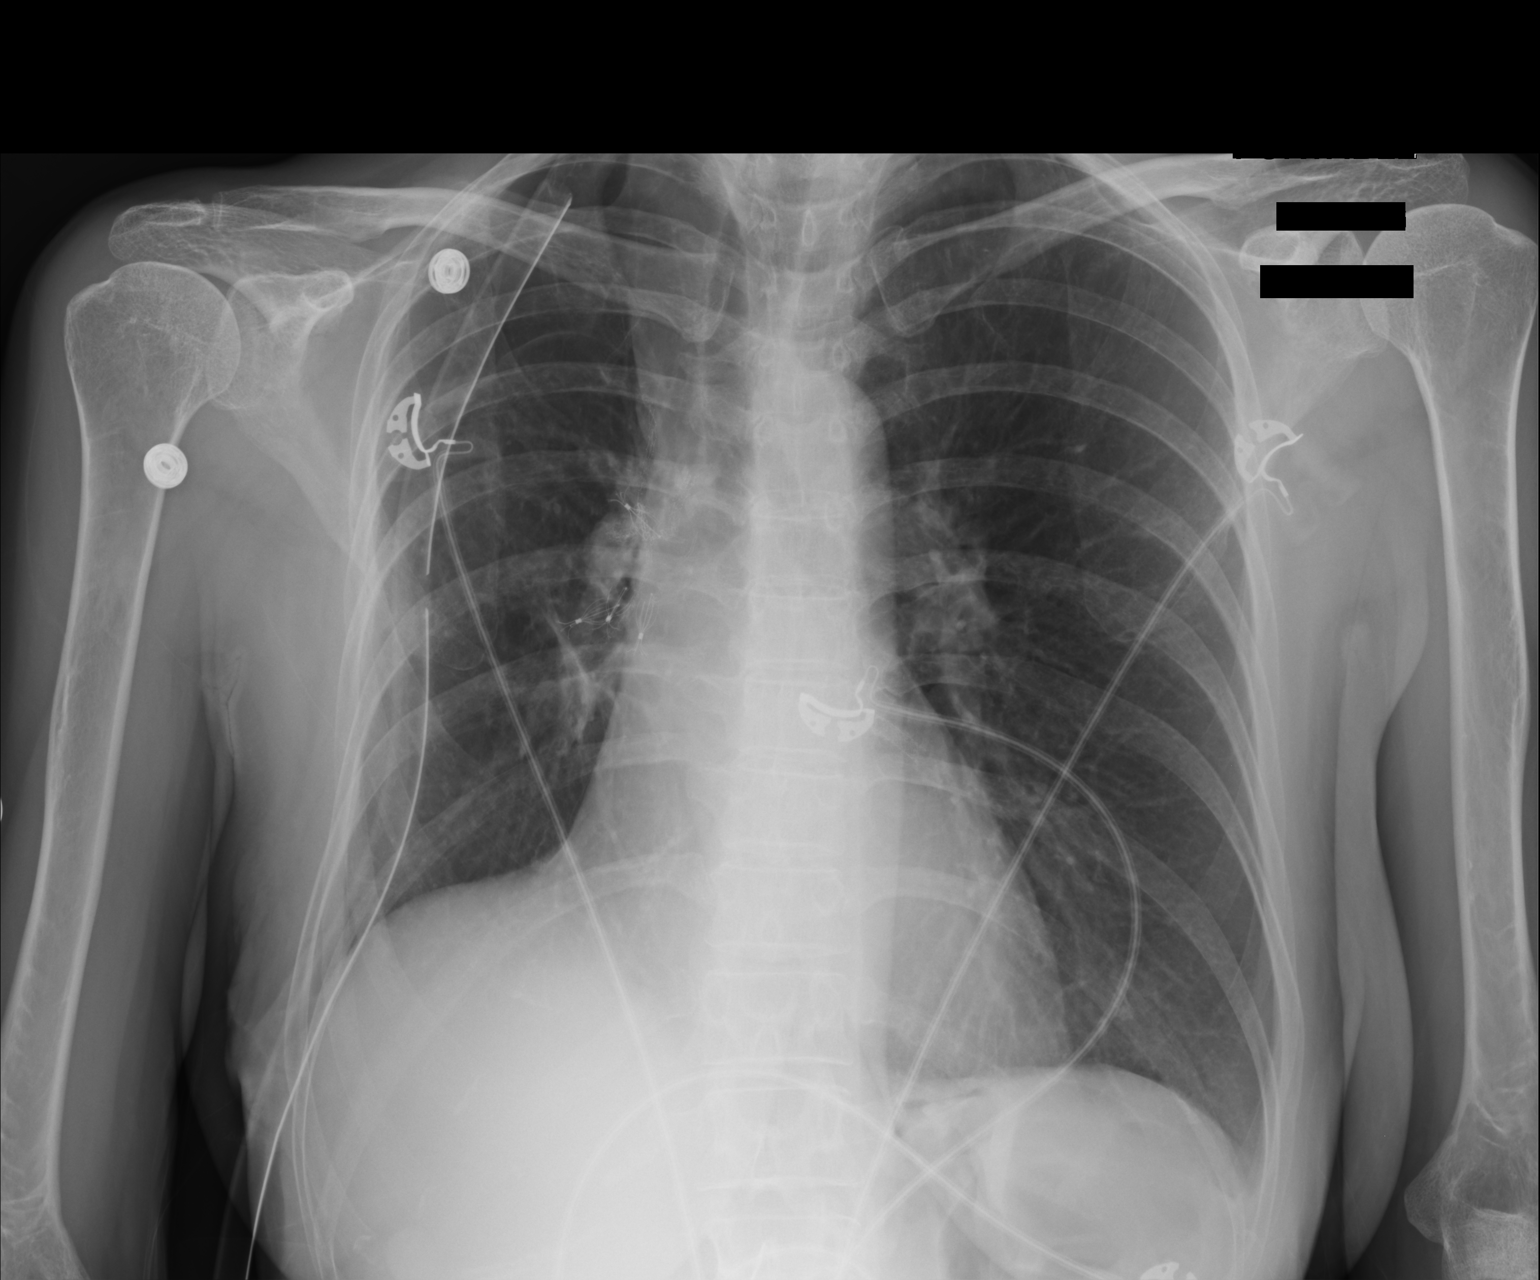

[1 of 1 positions shown; findings below may reference images not displayed]

FINDINGS: Residual trace pneumothorax at the right apex shows further decrease
in size since the prior study. There is stable positioning of a
single lateral right chest tube. Multiple right-sided bronchial
valves shows stable positioning by chest x-ray. There is no evidence
of pulmonary edema or pleural effusion. The heart size and
mediastinal contours are normal.
IMPRESSION: Further reduction in size of the residual right apical pneumothorax
with only a tiny trace pneumothorax remaining.

## 2017-08-18 IMAGING — CR DG CHEST 2V
2 series · 2 of 2 positions shown · non-contrast
Comparison: Yesterday

CLINICAL DATA: Right chest tube.  History of lung cancer

EXAM:
CHEST  2 VIEW

[chest pa]
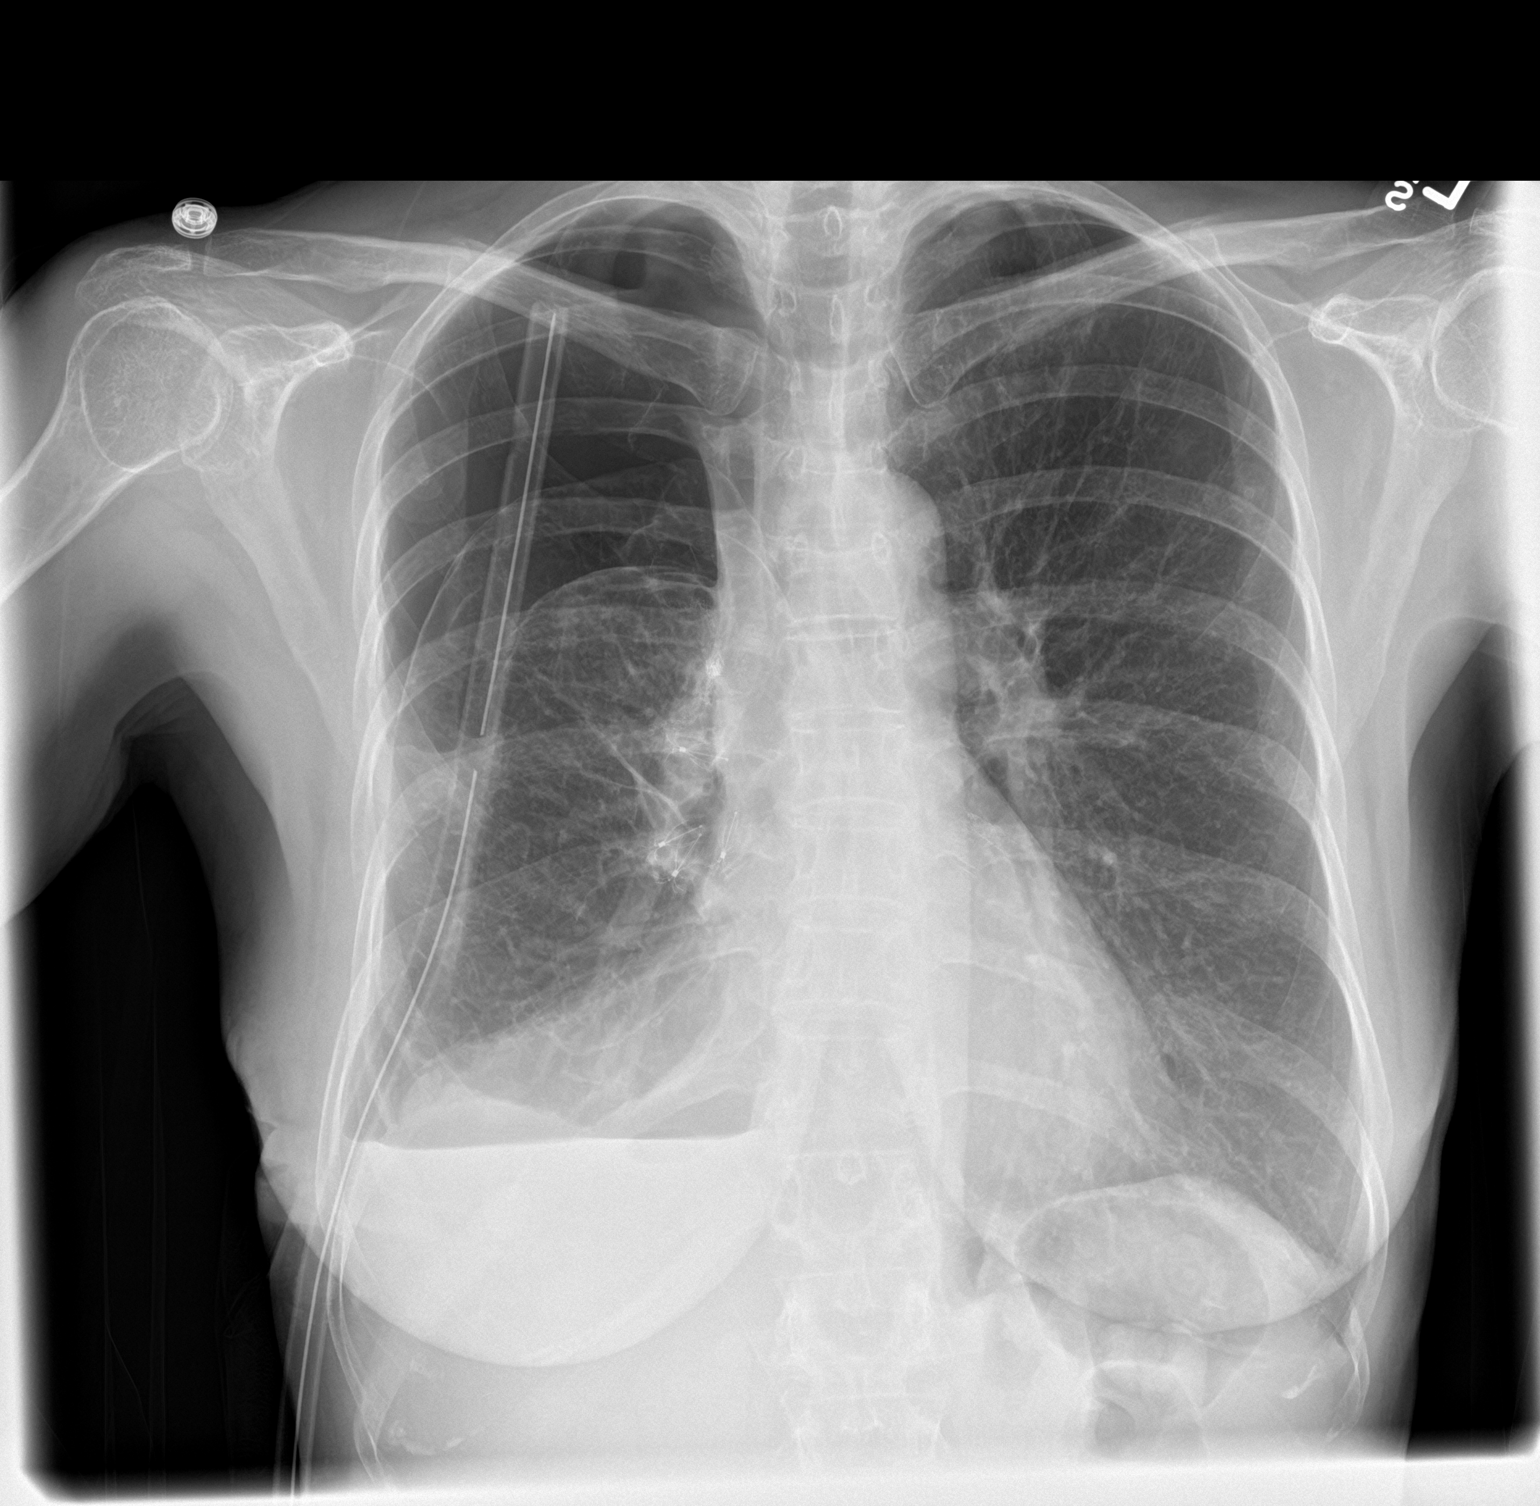

[chest lat]
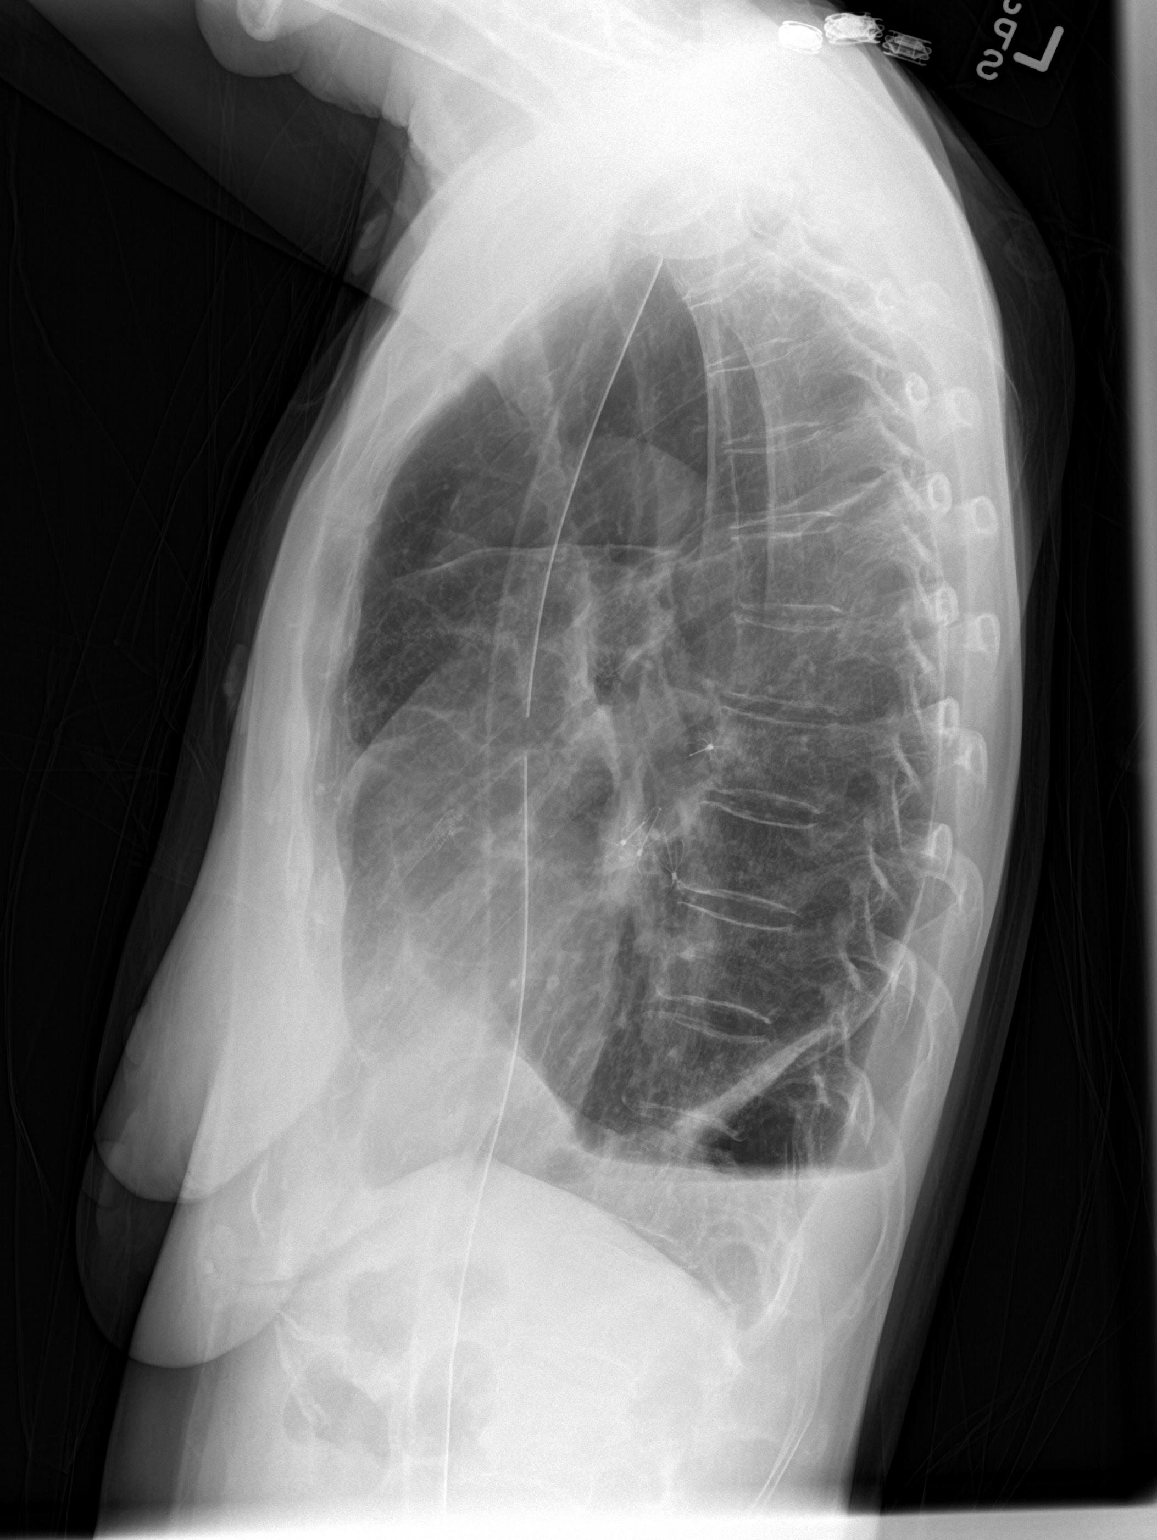

[2 of 2 positions shown; findings below may reference images not displayed]

FINDINGS: Interval increase in right pneumothorax, approximately 40% now. No
mediastinal shift. The right diaphragm is obscured by a small
effusion, with no evidence of depression. Right apical chest tube is
in stable position. Endobronchial valves show no interval
displacement. The left lung remains well aerated. Normal heart size
and aortic contours.

These results were called by telephone at the time of interpretation
on 04/28/2015 at [DATE] to RN Niachimba, who verbally acknowledged
these results.
IMPRESSION: 1. Enlarged right pneumothorax, now estimated at 40%.
2. Small right pleural effusion.
3. No interval displacement of interbronchial valves.

## 2017-08-19 NOTE — Progress Notes (Signed)
Cardiology Office Note:    Date:  08/20/2017   ID:  Michelle King, DOB 10/22/1960, MRN [id number]  PCP:  Michelle Sanes, MD  Cardiologist:  Michelle More, MD    Referring MD: Michelle Sanes, MD    ASSESSMENT:    1. Coronary artery disease involving native coronary artery of native heart with angina pectoris ([place])   2. Hyperlipidemia, unspecified hyperlipidemia type   3. Chronic obstructive pulmonary disease, unspecified COPD type ([place])   4. Malignant neoplasm of upper lobe of right lung ([facility])    PLAN:    In order of problems listed above:  1. Stable after bypass surgery.  This time she will continue her medical treatment including aspirin clopidogrel and lipid-lowering therapy with acetamide.  In the past she took PCSK9 discontinued on her own because of cost.  I referred her to lipid clinic her last LDL was performed on therapy.  She agrees to return to lipid clinic to explore financing now that she has Medicare and Medicaid and if she is unable to afford the agent to be considered for investigational trial.  I strongly encouraged her to continue lifestyle modification activity and diet. 2. Poorly controlled very high residual LDL see plan above referred to lipid clinic continue therapy with Zetia 3. Improved with bronchodilators managed by pulmonary 4. Stable I reviewed recent note from CT surgery and continuing CT scan screening.   Next appointment: 6 months   Medication Adjustments/Labs and Tests Ordered: Current medicines are reviewed at length with the patient today.  Concerns regarding medicines are outlined above.  No orders of the defined types were placed in this encounter.  No orders of the defined types were placed in this encounter.   Chief Complaint  Patient presents with  . Follow-up    6 month flup appt     History of Present Illness:    Michelle King is a 57 y.o. female with a hx of STEMI, S/P PCI of proximal LAD, CAD  Dyslipidemia statin imtolerant with an LDL of 155 on ezetamide. , severe COPD and lung cancer last seen in August 2018. She did not attend lipid clinic. Compliance with diet, lifestyle and medications: Yes Past Medical History:  Diagnosis Date  . Angio-edema 09/05/2016  . CAD (coronary artery disease)   . Cancer ([place])    lung  . Chronic idiopathic urticaria 09/05/2016  . Complication of anesthesia   . COPD (chronic obstructive pulmonary disease) ([place])   . Coronary artery disease involving native coronary artery of native heart with angina pectoris [facility])    Overview:  Anterior MI 2010 with PCI and stent Her last stress test pharmacologic showed normal ejection fraction and no evidence of ischemia. She has a history of previous LAD infarction with severely depressed ejection fraction that normalized with medical therapy and revascularization percutaneously.  . Family history of colon cancer   . Hematochezia 04/14/2015  . History of bilateral tubal ligation   . Hyperlipidemia   . Lung cancer-right upper lobe 11/15/2014  . Myocardial infarction ([place]) 2010  . Obstructive chronic bronchitis without exacerbation ([place]) 03/01/2015  . Pneumonia    hx  . PONV (postoperative nausea and vomiting)   . Preoperative cardiovascular examination 03/02/2015  . 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 Pure hypercholesterolemia 03/01/2015  . S/P hysterectomy   . S/P lobectomy of lung 03/25/2015  . Shortness of breath dyspnea     Past Surgical History:  Procedure Laterality Date  . APPENDECTOMY    .  CARDIAC CATHETERIZATION     stents  . COLONOSCOPY N/A 04/18/2015   Procedure: COLONOSCOPY;  Surgeon: Ladene Artist, MD;  Location: [facility] ENDOSCOPY;  Service: Endoscopy;  Laterality: N/A;  . MOUTH SURGERY     teeth  . TUBAL LIGATION    . VIDEO ASSISTED THORACOSCOPY (VATS)/WEDGE RESECTION Right 03/25/2015   Procedure: VIDEO ASSISTED THORACOSCOPY (VATS)/LUNG RESECTION;  Surgeon: Grace Isaac, MD;  Location: [place] [place];  Service:  Thoracic;  Laterality: Right;  Marland Kitchen VIDEO BRONCHOSCOPY N/A 03/25/2015   Procedure: VIDEO BRONCHOSCOPY;  Surgeon: Grace Isaac, MD;  Location: [facility] OR;  Service: Thoracic;  Laterality: N/A;  . VIDEO BRONCHOSCOPY WITH INSERTION OF INTERBRONCHIAL VALVE (IBV) N/A 04/12/2015   Procedure: VIDEO BRONCHOSCOPY WITH INSERTION OF 7MM INTERBRONCHIAL VALVES (IBV) IN SUPERIOR SEGMENT, MEDIAL AND POSTERIOR SEGMENT, AND LATERAL SEGMENT OF RIGHT LOWER LOBE;  Surgeon: Grace Isaac, MD;  Location: [facility] OR;  Service: Thoracic;  Laterality: N/A;  61mm Spiration Valve placed in Superior Segment, Medial and Posterior Segment, and Lateral Segment of Right Lower Lobe  . VIDEO BRONCHOSCOPY WITH INSERTION OF INTERBRONCHIAL VALVE (IBV) Right 04/19/2015   Procedure: VIDEO BRONCHOSCOPY WITH INSERTION OF INTERBRONCHIAL VALVE (IBV);  Surgeon: Grace Isaac, MD;  Location: [facility];  Service: Thoracic;  Laterality: Right;  Marland Kitchen VIDEO BRONCHOSCOPY WITH INSERTION OF INTERBRONCHIAL VALVE (IBV) N/A 06/03/2015   Procedure: VIDEO BRONCHOSCOPY WITH REMOVAL OF INTERBRONCHIAL VALVE (IBV);  Surgeon: Grace Isaac, MD;  Location: [facility] OR;  Service: Thoracic;  Laterality: N/A;    Current Medications: Current Meds  Medication Sig  . aspirin EC 81 MG tablet Take 81 mg by mouth daily.  . cetirizine (ZYRTEC) 10 MG tablet Take 1 tablet (10 mg total) by mouth daily.  . clopidogrel (PLAVIX) 75 MG tablet Take 1 tablet (75 mg total) by mouth daily.  . cycloSPORINE modified (NEORAL) 25 MG capsule Take 25 mg by mouth 4 (four) times daily.   Marland Kitchen ezetimibe (ZETIA) 10 MG tablet Take 1 tablet (10 mg total) by mouth daily.  Marland Kitchen gabapentin (NEURONTIN) 100 MG capsule Take 100 mg by mouth 4 (four) times daily.   . traMADol (ULTRAM) 50 MG tablet Take 1-2 tablets (50-100 mg total) by mouth every 6 (six) hours as needed for moderate pain. (Patient taking differently: Take 50 mg by mouth every 6 (six) hours as needed for moderate pain. )  . umeclidinium-vilanterol  (ANORO ELLIPTA) 62.5-25 MCG/INH AEPB Inhale 1 puff into the lungs daily as needed (wheezing).    Current Facility-Administered Medications for the 08/20/17 encounter (Office Visit) with Richardo Priest, MD  Medication  . omalizumab Arvid Right) injection 300 mg     Allergies:   Codeine; Rosuvastatin; and Sympathomimetics   Social History   Socioeconomic History  . Marital status: Married    Spouse name: Not on file  . Number of children: Not on file  . Years of education: Not on file  . Highest education level: Not on file  Social Needs  . Financial resource strain: Not on file  . Food insecurity - worry: Not on file  . Food insecurity - inability: Not on file  . Transportation needs - medical: Not on file  . Transportation needs - non-medical: Not on file  Occupational History  . Not on file  Tobacco Use  . Smoking status: Former Smoker    Packs/day: 0.50    Years: 40.00    Pack years: 20.00    Types: Cigarettes    Last attempt to quit:  03/25/2015    Years since quitting: 2.4  . Smokeless tobacco: Never Used  . Tobacco comment: down to 4 aday  Substance and Sexual Activity  . Alcohol use: No    Alcohol/week: 0.0 oz  . Drug use: No  . Sexual activity: Not on file  Other Topics Concern  . Not on file  Social History Narrative  . Not on file     Family History: The patient's family history includes Cancer in her brother, father, and sister; Cerebrovascular Disease in her brother and brother; Diabetes in her mother; Heart disease in her brother and brother; Stroke in her sister and sister. ROS:   Please see the history of present illness.  Also she has had weight gain unintentional and has ongoing shortness of breath with activity but is markedly improved with pulmonary care and bronchodilators and is only short of breath from [place] than usual or moderate or greater activities.  Not coughing and wheezing no edema orthopnea or PND All other systems reviewed and are  negative.  EKGs/Labs/Other Studies Reviewed:    The following studies were reviewed today  Recent Labs: 02/20/2017: ALT 11; BUN 15; Creatinine, Ser 0.49; Potassium 4.3; Sodium 142  Recent Lipid Panel    Component Value Date/Time   CHOL 237 (H) 04/03/2017 1210   TRIG 181 (H) 04/03/2017 1210   HDL 46 04/03/2017 1210   CHOLHDL 5.2 (H) 04/03/2017 1210   CHOLHDL 5.3 04/19/2009 1130   VLDL 23 04/19/2009 1130   LDLCALC 155 (H) 04/03/2017 1210    Physical Exam:    VS:  BP 118/74 (BP Location: Right Arm, Patient Position: Sitting, Cuff Size: Normal)   Pulse 81   Ht 5\' 2"  (1.575 m)   Wt 144 lb 1.9 oz (65.4 kg)   SpO2 97%   BMI 26.36 kg/m     Wt Readings from Last 3 Encounters:  08/20/17 144 lb 1.9 oz (65.4 kg)  03/07/17 140 lb (63.5 kg)  02/20/17 139 lb 6.4 oz (63.2 kg)     GEN:  Well nourished, well developed in no acute distress HEENT: Normal NECK: No JVD; No carotid bruits LYMPHATICS: No lymphadenopathy CARDIAC: RRR, no murmurs, rubs, gallops RESPIRATORY:  Clear to auscultation without rales, wheezing or rhonchi  ABDOMEN: Soft, non-tender, non-distended MUSCULOSKELETAL:  No edema; No deformity  SKIN: Warm and dry NEUROLOGIC:  Alert and oriented x 3 PSYCHIATRIC:  Normal affect    Signed, Michelle More, MD  08/20/2017 10:11 AM    [place]

## 2017-08-20 ENCOUNTER — Ambulatory Visit (INDEPENDENT_AMBULATORY_CARE_PROVIDER_SITE_OTHER): Payer: Medicare Other | Admitting: Cardiology

## 2017-08-20 VITALS — BP 118/74 | HR 81 | Ht 62.0 in | Wt 144.1 lb

## 2017-08-20 DIAGNOSIS — I25119 Atherosclerotic heart disease of native coronary artery with unspecified angina pectoris: Secondary | ICD-10-CM | POA: Diagnosis not present

## 2017-08-20 DIAGNOSIS — C3411 Malignant neoplasm of upper lobe, right bronchus or lung: Secondary | ICD-10-CM

## 2017-08-20 DIAGNOSIS — E785 Hyperlipidemia, unspecified: Secondary | ICD-10-CM

## 2017-08-20 DIAGNOSIS — J449 Chronic obstructive pulmonary disease, unspecified: Secondary | ICD-10-CM | POA: Diagnosis not present

## 2017-08-20 IMAGING — CR DG CHEST 1V PORT
1 series · 1 of 1 positions shown · non-contrast
Comparison: [id number]

CLINICAL DATA: Chest tube in place

EXAM:
PORTABLE CHEST 1 VIEW

[AP]
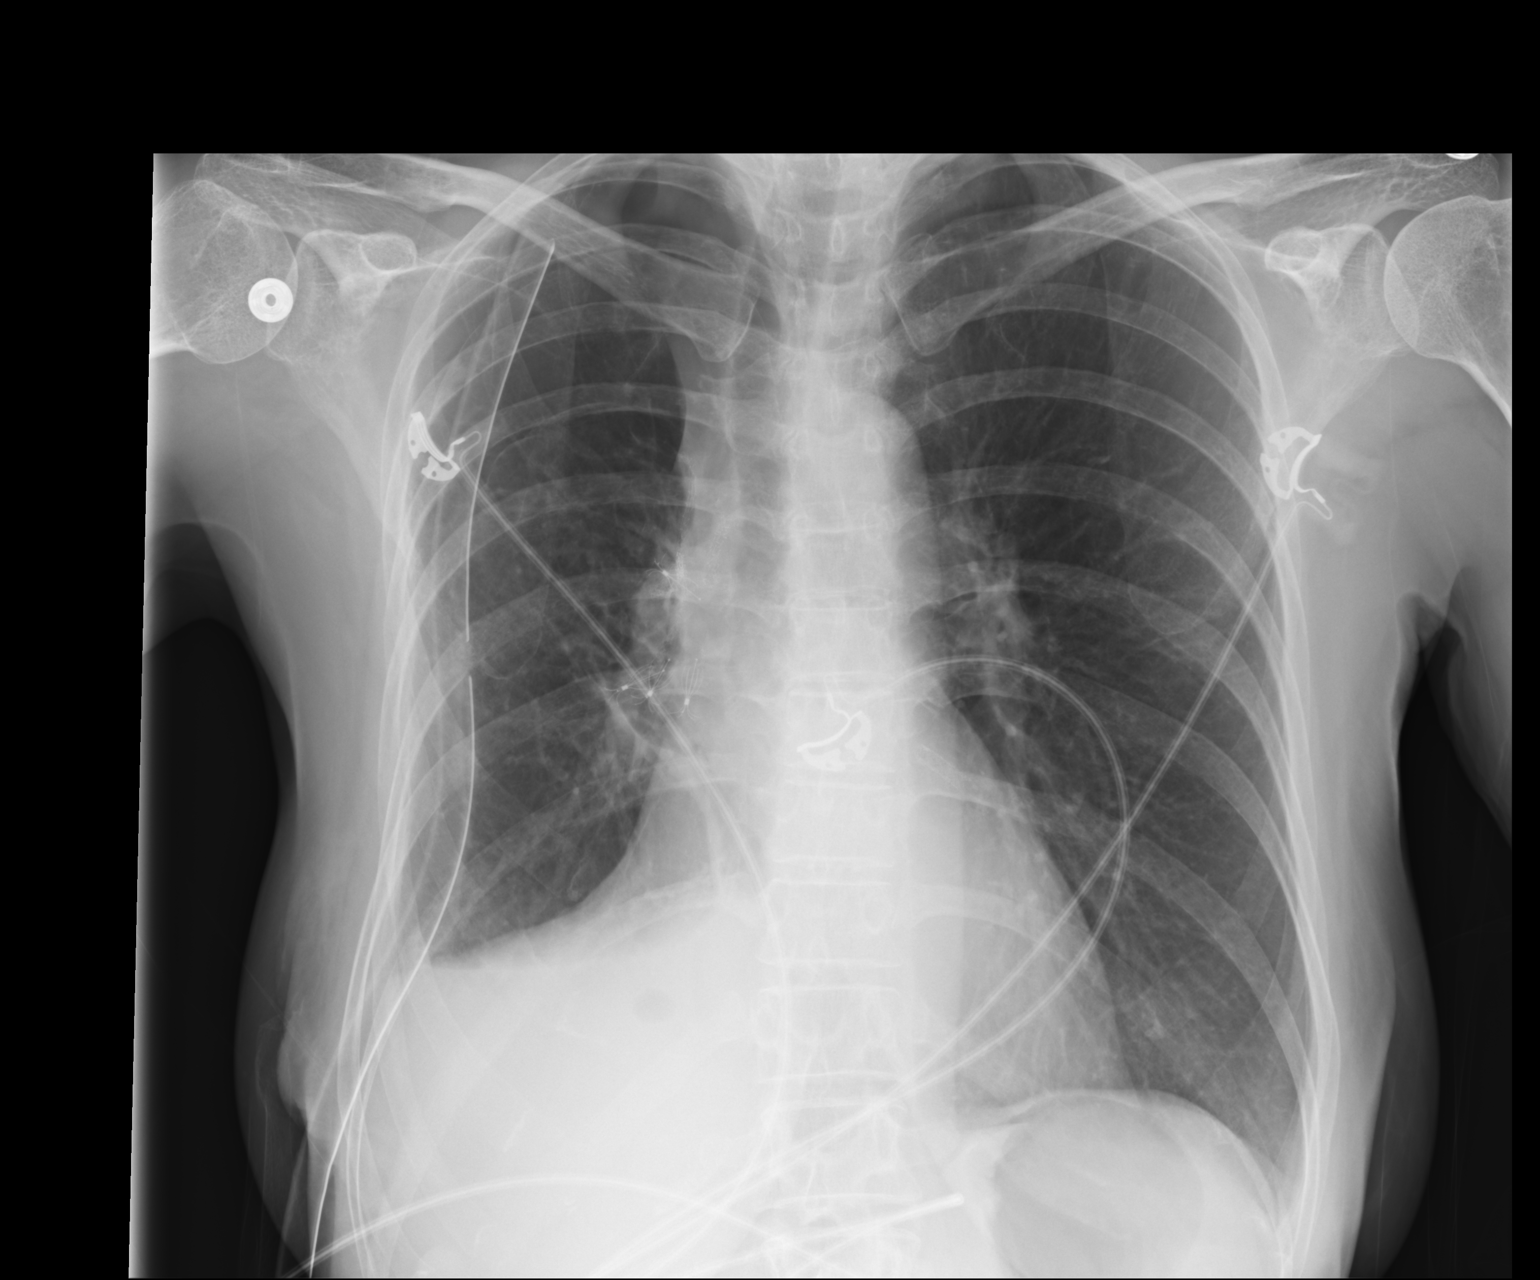

[1 of 1 positions shown; findings below may reference images not displayed]

FINDINGS: Stable right hydropneumothorax with small apical pneumothorax
component, unchanged. Indwelling right apical chest tube.

Left lung is clear.

The heart is normal in size.

Postsurgical changes in the right hilum.
IMPRESSION: Postsurgical changes in the right hemithorax.

Small right hydropneumothorax with small apical pneumothorax
component, unchanged.

Indwelling right chest tube.

## 2017-08-20 NOTE — Patient Instructions (Signed)
Medication Instructions:  Your physician has recommended you make the following change in your medication:  You will be started on an injectable lipid lowering therapy. You will receive a phone call to set up.  Labwork: None  Testing/Procedures: None  Follow-Up: Your physician wants you to follow-up in: 6 months. You will receive a reminder letter in the mail two months in advance. If you don't receive a letter, please call our office to schedule the follow-up appointment.  Any Other Special Instructions Will Be Listed Below (If Applicable).     If you need a refill on your cardiac medications before your next appointment, please call your pharmacy.

## 2017-08-29 ENCOUNTER — Telehealth: Payer: Self-pay

## 2017-08-29 ENCOUNTER — Other Ambulatory Visit: Payer: Self-pay | Admitting: Pharmacist

## 2017-08-29 MED ORDER — EVOLOCUMAB 140 MG/ML ~~LOC~~ SOAJ: 140.0000 mg | SUBCUTANEOUS | 11 refills | Status: DC

## 2017-08-29 NOTE — Telephone Encounter (Signed)
Patient's husband, Merry Proud, advised that Repatha prior authorization has been approved. Advised that [facility] will not fill the medication and that the prescription has been sent to [facility]. Advised to return call if further patient assistance is needed. Merry Proud verbalized understanding, no further questions.

## 2017-08-29 NOTE — Telephone Encounter (Signed)
repatha approved by insurance. RX sent to [facility].  Patient should contact clinic if unable to afford co-pay    Adiba Fargnoli Rodriguez-Guzman PharmD, BCPS, [place] [address] [place] [address] 08/29/2017 2:12 PM

## 2017-09-05 ENCOUNTER — Ambulatory Visit: Payer: Medicare Other | Admitting: Cardiothoracic Surgery

## 2017-09-06 ENCOUNTER — Telehealth: Payer: Self-pay | Admitting: Cardiology

## 2017-09-06 NOTE — Telephone Encounter (Signed)
Patient and husband advised that prescription was sent to [facility] as discussed at last phone call. Patient verbalized understanding and will call that pharmacy. No further questions. Advised to return call if any further issues arise.

## 2017-09-06 NOTE — Telephone Encounter (Signed)
Patient's husband states cholesterol injection has been approved by Medicare, but local pharmacy does not stock. Can you please call regarding this?

## 2017-09-12 ENCOUNTER — Ambulatory Visit: Payer: Medicare Other | Admitting: Cardiothoracic Surgery

## 2017-09-26 ENCOUNTER — Ambulatory Visit: Payer: Medicare Other | Admitting: Cardiothoracic Surgery

## 2017-10-03 DIAGNOSIS — Z9221 Personal history of antineoplastic chemotherapy: Secondary | ICD-10-CM | POA: Diagnosis not present

## 2017-10-03 DIAGNOSIS — Z85118 Personal history of other malignant neoplasm of bronchus and lung: Secondary | ICD-10-CM | POA: Diagnosis not present

## 2017-10-03 DIAGNOSIS — Z902 Acquired absence of lung [part of]: Secondary | ICD-10-CM | POA: Diagnosis not present

## 2017-10-10 ENCOUNTER — Ambulatory Visit (INDEPENDENT_AMBULATORY_CARE_PROVIDER_SITE_OTHER): Payer: Medicare Other | Admitting: Cardiothoracic Surgery

## 2017-10-10 VITALS — BP 125/89 | HR 92 | Resp 20 | Ht 62.0 in | Wt 144.0 lb

## 2017-10-10 DIAGNOSIS — I25119 Atherosclerotic heart disease of native coronary artery with unspecified angina pectoris: Secondary | ICD-10-CM

## 2017-10-10 DIAGNOSIS — Z902 Acquired absence of lung [part of]: Secondary | ICD-10-CM | POA: Diagnosis not present

## 2017-10-10 DIAGNOSIS — C3411 Malignant neoplasm of upper lobe, right bronchus or lung: Secondary | ICD-10-CM | POA: Diagnosis not present

## 2017-10-10 NOTE — Progress Notes (Signed)
[address]       [place] [address]             [phone]      Michelle King Medical Record #[id number] Date of Birth: Sep 10, 1960  Referring: Marice Potter, MD Primary Care: Charlotte Sanes, MD  Chief Complaint:   POST OP FOLLOW UP  Lung cancer-right upper lobe   Staging form: Lung, AJCC 7th Edition     Clinical: No stage assigned - Unsigned     Pathologic stage from 03/29/2015: Stage IIA (yT1b, N1, cM0) - Unsigned  03/25/2015  OPERATIVE REPORT PREOPERATIVE DIAGNOSIS: Adenocarcinoma, right upper lobe, status post chemotherapy. POSTOPERATIVE DIAGNOSIS: Adenocarcinoma, right upper lobe, status post chemotherapy. SURGICAL PROCEDURE: 1. Video bronchoscopy. 2. Right video-assisted thoracoscopy with right upper lobectomy. 3. Lymph node dissection. 4. Placement of ON-Q. SURGEON: Lanelle Bal, M.D  History of Present Illness:     Patient returns to the office today in follow-up after right upper lobectomy for adenocarcinoma the lung in September 2016. The patient did have a follow-up chest x-ray and was seen by Dr. Bobby Rumpf in [place] last week. She's scheduled to have a repeat scan in September. She remains off cigarettes now for 2-1/2 years  Past Medical History:  Diagnosis Date  . Angio-edema 09/05/2016  . CAD (coronary artery disease)   . Cancer ([place])    lung  . Chronic idiopathic urticaria 09/05/2016  . Complication of anesthesia   . COPD (chronic obstructive pulmonary disease) ([place])   . Coronary artery disease involving native coronary artery of native heart with angina pectoris [facility])    Overview:  Anterior MI 2010 with PCI and stent Her last stress test pharmacologic showed normal ejection fraction and no evidence of ischemia. She has a history of previous LAD infarction with severely depressed ejection fraction that normalized with medical therapy and revascularization percutaneously.  . Family history of colon  cancer   . Hematochezia 04/14/2015  . History of bilateral tubal ligation   . Hyperlipidemia   . Lung cancer-right upper lobe 11/15/2014  . Myocardial infarction ([place]) 2010  . Obstructive chronic bronchitis without exacerbation ([place]) 03/01/2015  . Pneumonia    hx  . PONV (postoperative nausea and vomiting)   . Preoperative cardiovascular examination 03/02/2015  . 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 Pure hypercholesterolemia 03/01/2015  . S/P hysterectomy   . S/P lobectomy of lung 03/25/2015  . Shortness of breath dyspnea      Social History   Tobacco Use  Smoking Status Former Smoker  . Packs/day: 0.50  . Years: 40.00  . Pack years: 20.00  . Types: Cigarettes  . Last attempt to quit: 03/25/2015  . Years since quitting: 2.5  Smokeless Tobacco Never Used  Tobacco Comment   down to 4 aday    Social History   Substance and Sexual Activity  Alcohol Use No  . Alcohol/week: 0.0 oz     Allergies  Allergen Reactions  . Codeine Itching and Swelling    Facial swelling Tolerates norco  . Rosuvastatin Other (See Comments)    Nose bleeds  . Sympathomimetics Other (See Comments)    Pt was not told what the reaction was    Current Outpatient Medications  Medication Sig Dispense Refill  . aspirin EC 81 MG tablet Take 81 mg by mouth daily.    . cetirizine (ZYRTEC) 10 MG tablet Take 1 tablet (10 mg total) by mouth daily. 30 tablet 5  . clopidogrel (  PLAVIX) 75 MG tablet Take 1 tablet (75 mg total) by mouth daily. 30 tablet 10  . cycloSPORINE modified (NEORAL) 25 MG capsule Take 25 mg by mouth 4 (four) times daily.     . Evolocumab (REPATHA SURECLICK) 242 MG/ML SOAJ Inject 140 mg into the skin every 14 (fourteen) days. 2 pen 11  . ezetimibe (ZETIA) 10 MG tablet Take 1 tablet (10 mg total) by mouth daily. 90 tablet 3  . gabapentin (NEURONTIN) 100 MG capsule Take 100 mg by mouth 4 (four) times daily.     . traMADol (ULTRAM) 50 MG tablet Take 1-2 tablets (50-100 mg total) by mouth every 6 (six)  hours as needed for moderate pain. (Patient taking differently: Take 50 mg by mouth every 6 (six) hours as needed for moderate pain. ) 30 tablet 0  . umeclidinium-vilanterol (ANORO ELLIPTA) 62.5-25 MCG/INH AEPB Inhale 1 puff into the lungs daily as needed (wheezing).      Current Facility-Administered Medications  Medication Dose Route Frequency Provider Last Rate Last Dose  . omalizumab Arvid Right) injection 300 mg  300 mg Subcutaneous Q28 days Jiles Prows, MD   300 mg at 08/30/16 1013       Physical Exam: BP 125/89   Pulse 92   Resp 20   Ht 5\' 2"  (1.575 m)   Wt 144 lb (65.3 kg)   SpO2 98% Comment: RA  BMI 26.34 kg/m   General appearance: alert and cooperative Head: Normocephalic, without obvious abnormality, atraumatic Neck: no adenopathy, no carotid bruit, no JVD, supple, symmetrical, trachea midline and thyroid not enlarged, symmetric, no tenderness/mass/nodules Lymph nodes: Cervical, supraclavicular, and axillary nodes normal. Resp: clear to auscultation bilaterally Back: symmetric, no curvature. ROM normal. No CVA tenderness. Cardio: regular rate and rhythm, S1, S2 normal, no murmur, click, rub or gallop GI: soft, non-tender; bowel sounds normal; no masses,  no organomegaly Extremities: extremities normal, atraumatic, no cyanosis or edema and Homans sign is negative, no sign of DVT Neurologic: Grossly normal  Diagnostic Studies & Laboratory data:     Recent Radiology Findings:  Final Report  CLINICAL DATA: Lung cancer diagnosed 03/24/2016. Chemotherapy completed. Right upper lobectomy. Prior cardiac stents.  EXAM: CT CHEST WITH CONTRAST  TECHNIQUE: Multidetector CT imaging of the chest was performed during intravenous contrast administration.  CONTRAST: 60 cc Isovue 370  COMPARISON: Plain films 07/27/2016. CT 03/27/2016.  FINDINGS: Cardiovascular: Aortic and branch vessel atherosclerosis. Borderline cardiomegaly, without pericardial effusion. Lad coronary  artery stent. Multivessel coronary artery atherosclerosis. No central pulmonary embolism, on this non-dedicated study.  Mediastinum/Nodes: No supraclavicular adenopathy. Right paratracheal node measures 9 mm on image 30/series 2 and is new.  Subcarinal node measures 6 mm on image 42/series 2 versus 4 mm on the prior.  Lateral AP window node measures 10 mm on image 35/series 2 versus 7 mm on the prior.  A prevascular node measures 6 mm on image 33/series 2 versus 4 mm on the prior.  Lungs/Pleura: No pleural fluid. Moderate centrilobular emphysema.  New right lower lobe clustered ill-defined peribronchovascular nodularity, including on image 72/series 4. Mild volume loss at the left lung base.  Upper Abdomen: Possible mild hepatic steatosis. Normal imaged portions of the spleen, stomach, pancreas, gallbladder, biliary tract, adrenal glands, kidneys. Abdominal aortic atherosclerosis.  Musculoskeletal: No acute osseous abnormality.  IMPRESSION: 1. Status post right upper lobectomy. 2. New clustered nodules in the right lower lobe are suspicious for infection, including atypical etiologies. Correlate with infectious symptoms. 3. Enlarging thoracic nodes. Although none are  pathologic by size criteria, nodal metastasis cannot be excluded. Given possible infection, the could these could alternatively be infectious. Potential clinical strategies include antibiotic therapy and follow-up at 6 weeks with CT versus further characterization with PET. 4. Coronary artery atherosclerosis. Aortic atherosclerosis.   Electronically Signed By: Abigail Miyamoto M.D. On: 11/26/2016 13:59    Recent Lab Findings: Lab Results  Component Value Date   WBC 5.8 04/18/2015   HGB 7.6* 04/18/2015   HCT 23.9* 04/18/2015   PLT 334 04/18/2015   GLUCOSE 97 05/04/2015   CHOL  04/19/2009    190        ATP III CLASSIFICATION:  <200     mg/dL   Desirable  200-239  mg/dL   Borderline High  >=240     mg/dL   High        SLIGHT HEMOLYSIS   TRIG 116 SLIGHT HEMOLYSIS 04/19/2009   HDL 36 SLIGHT HEMOLYSIS* 04/19/2009   LDLCALC * 04/19/2009    131        Total Cholesterol/HDL:CHD Risk Coronary Heart Disease Risk Table                     Men   Women  1/2 Average Risk   3.4   3.3  Average Risk       5.0   4.4  2 X Average Risk   9.6   7.1  3 X Average Risk  23.4   11.0        Use the calculated Patient Ratio above and the CHD Risk Table to determine the patient's CHD Risk.        ATP III CLASSIFICATION (LDL):  <100     mg/dL   Optimal  100-129  mg/dL   Near or Above                    Optimal  130-159  mg/dL   Borderline  160-189  mg/dL   High  >190     mg/dL   Very High   ALT 10* 04/18/2015   AST 17 04/18/2015   NA 139 05/04/2015   K 4.1 05/04/2015   CL 101 05/04/2015   CREATININE 0.50 05/04/2015   BUN 12 05/04/2015   CO2 27 05/04/2015   TSH 1.539 Test methodology is 3rd generation TSH 04/19/2009   INR 1.15 04/18/2015      Assessment / Plan:    Patient doing well following resection of stage II a, non-small cell carcinoma. She remains tobacco free Patient notes she is to have a CT scan in approximately 6 months. I have not made a return appointment to be seen in the surgery office as she is being closely followed in medical oncology with serial CT scans.  She and her husband are aware that be glad to see her at any time should be necessary and that I keep in contact with Dr. Bobby Rumpf.  Grace Isaac MD      [address] [place] [address] Office [phone]   Murlean Hark [phone]  10/10/2017 1:24 PM      Patient ID: Michelle King, Michelle King   DOB: 1960-12-03, 57 y.o.   MRN: [id number]

## 2018-03-10 DIAGNOSIS — Z789 Other specified health status: Secondary | ICD-10-CM

## 2018-03-10 HISTORY — DX: Other specified health status: Z78.9

## 2018-03-10 NOTE — Progress Notes (Signed)
Cardiology Office Note:    Date:  03/11/2018   ID:  Michelle King, DOB [birth year], MRN [id number]  PCP:  Charlotte Sanes, MD  Cardiologist:  Shirlee More, MD    Referring MD: Charlotte Sanes, MD    ASSESSMENT:    1. Coronary artery disease involving native coronary artery of native heart with angina pectoris (HCC)   2. Statin intolerance   3. Hyperlipidemia, unspecified hyperlipidemia type    PLAN:    In order of problems listed above:  1. Stable continue medical treatment I do not think she is an ischemia evaluation at this time 2. She will continue her PC SK 9 check lipid profile she may require additional drug therapy if not at goal 3. Improved continue current treatment check lipid profile 4. She also complains of restless leg has a history of iron deficiency we will check her iron studies including ferritin saturation TIBC and iron binding.  She has arrangements to follow-up with hematology.  Prior to treating her with drugs that would resolve iron deficiency as it can cause restless leg 5. She also has a vague complaint of her right arm hurting that sounds radicular in nature and if symptoms progress she may benefit from EMG and nerve conduction studies    Next appointment: One year   Medication Adjustments/Labs and Tests Ordered: Current medicines are reviewed at length with the patient today.  Concerns regarding medicines are outlined above.  No orders of the defined types were placed in this encounter.  No orders of the defined types were placed in this encounter.   Chief Complaint  Patient presents with  . Coronary Artery Disease    History of Present Illness:    Michelle King is a 57 y.o. female with a hx of  hx of STEMI, S/P PCI of proximal LAD, CAD Dyslipidemia statin imtolerant with an LDL of 155 on ezetamide. , severe COPD and lung cancer last seen 08/20/17.  Compliance with diet, lifestyle and medications: Yes  Overall she  is improved no severe shortness of breath angina palpitation or syncope she has a history of iron deficiency anemia her last hemoglobin was in the range of 10 with small MCV she is received IV iron in the past and is complaining of restless leg.  Iron studies ordered she has arrangements to be seen by hematology in the next month.  She has had no angina palpitation or syncope and tolerates lipid-lowering therapy.  She complains of intermittent radicular pain in the right arm. Past Medical History:  Diagnosis Date  . Angio-edema 09/05/2016  . CAD (coronary artery disease)   . Cancer ([place])    lung  . Chronic idiopathic urticaria 09/05/2016  . Complication of anesthesia   . COPD (chronic obstructive pulmonary disease) ([place])   . Coronary artery disease involving native coronary artery of native heart with angina pectoris [facility])    Overview:  Anterior MI 2010 with PCI and stent Her last stress test pharmacologic showed normal ejection fraction and no evidence of ischemia. She has a history of previous LAD infarction with severely depressed ejection fraction that normalized with medical therapy and revascularization percutaneously.  . Family history of colon cancer   . Hematochezia 04/14/2015  . History of bilateral tubal ligation   . Hyperlipidemia   . Lung cancer-right upper lobe 11/15/2014  . Myocardial infarction ([place]) 2010  . Obstructive chronic bronchitis without exacerbation ([place]) 03/01/2015  . Pneumonia    hx  . PONV (postoperative  nausea and vomiting)   . Preoperative cardiovascular examination 03/02/2015  . 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 Pure hypercholesterolemia 03/01/2015  . S/P hysterectomy   . S/P lobectomy of lung 03/25/2015  . Shortness of breath dyspnea     Past Surgical History:  Procedure Laterality Date  . APPENDECTOMY    . CARDIAC CATHETERIZATION     stents  . COLONOSCOPY N/A 04/18/2015   Procedure: COLONOSCOPY;  Surgeon: Ladene Artist, MD;  Location: [facility] ENDOSCOPY;  Service:  Endoscopy;  Laterality: N/A;  . MOUTH SURGERY     teeth  . TUBAL LIGATION    . VIDEO ASSISTED THORACOSCOPY (VATS)/WEDGE RESECTION Right 03/25/2015   Procedure: VIDEO ASSISTED THORACOSCOPY (VATS)/LUNG RESECTION;  Surgeon: Grace Isaac, MD;  Location: [facility];  Service: Thoracic;  Laterality: Right;  Marland Kitchen VIDEO BRONCHOSCOPY N/A 03/25/2015   Procedure: VIDEO BRONCHOSCOPY;  Surgeon: Grace Isaac, MD;  Location: [facility] OR;  Service: Thoracic;  Laterality: N/A;  . VIDEO BRONCHOSCOPY WITH INSERTION OF INTERBRONCHIAL VALVE (IBV) N/A 04/12/2015   Procedure: VIDEO BRONCHOSCOPY WITH INSERTION OF 7MM INTERBRONCHIAL VALVES (IBV) IN SUPERIOR SEGMENT, MEDIAL AND POSTERIOR SEGMENT, AND LATERAL SEGMENT OF RIGHT LOWER LOBE;  Surgeon: Grace Isaac, MD;  Location: [facility] OR;  Service: Thoracic;  Laterality: N/A;  75mm Spiration Valve placed in Superior Segment, Medial and Posterior Segment, and Lateral Segment of Right Lower Lobe  . VIDEO BRONCHOSCOPY WITH INSERTION OF INTERBRONCHIAL VALVE (IBV) Right 04/19/2015   Procedure: VIDEO BRONCHOSCOPY WITH INSERTION OF INTERBRONCHIAL VALVE (IBV);  Surgeon: Grace Isaac, MD;  Location: [facility];  Service: Thoracic;  Laterality: Right;  Marland Kitchen VIDEO BRONCHOSCOPY WITH INSERTION OF INTERBRONCHIAL VALVE (IBV) N/A 06/03/2015   Procedure: VIDEO BRONCHOSCOPY WITH REMOVAL OF INTERBRONCHIAL VALVE (IBV);  Surgeon: Grace Isaac, MD;  Location: [facility] OR;  Service: Thoracic;  Laterality: N/A;    Current Medications: Current Meds  Medication Sig  . albuterol (PROVENTIL HFA;VENTOLIN HFA) 108 (90 Base) MCG/ACT inhaler INHALE 2 PUFFS BY MOUTH AS NEEDED EVERY 4 TO 6 HOURS  . aspirin EC 81 MG tablet Take 81 mg by mouth daily.  . clopidogrel (PLAVIX) 75 MG tablet Take 1 tablet (75 mg total) by mouth daily.  . cycloSPORINE modified (NEORAL) 25 MG capsule Take 25 mg by mouth 2 (two) times daily.   . Evolocumab (REPATHA SURECLICK) 341 MG/ML SOAJ Inject 140 mg into the skin every 14 (fourteen) days.    Marland Kitchen gabapentin (NEURONTIN) 100 MG capsule Take 100 mg by mouth 2 (two) times daily.   . SYMBICORT 160-4.5 MCG/ACT inhaler Inhale 2 puffs into the lungs 2 (two) times daily.   Current Facility-Administered Medications for the 03/11/18 encounter (Office Visit) with Richardo Priest, MD  Medication  . omalizumab Arvid Right) injection 300 mg     Allergies:   Codeine; Rosuvastatin; and Sympathomimetics   Social History   Socioeconomic History  . Marital status: Married    Spouse name: Not on file  . Number of children: Not on file  . Years of education: Not on file  . Highest education level: Not on file  Occupational History  . Not on file  Social Needs  . Financial resource strain: Not on file  . Food insecurity:    Worry: Not on file    Inability: Not on file  . Transportation needs:    Medical: Not on file    Non-medical: Not on file  Tobacco Use  . Smoking status: Former Smoker    Packs/day: 0.50  Years: 40.00    Pack years: 20.00    Types: Cigarettes    Last attempt to quit: 03/25/2015    Years since quitting: 2.9  . Smokeless tobacco: Never Used  . Tobacco comment: down to 4 aday  Substance and Sexual Activity  . Alcohol use: No    Alcohol/week: 0.0 standard drinks  . Drug use: No  . Sexual activity: Not on file  Lifestyle  . Physical activity:    Days per week: Not on file    Minutes per session: Not on file  . Stress: Not on file  Relationships  . Social connections:    Talks on phone: Not on file    Gets together: Not on file    Attends religious service: Not on file    Active member of club or organization: Not on file    Attends meetings of clubs or organizations: Not on file    Relationship status: Not on file  Other Topics Concern  . Not on file  Social History Narrative  . Not on file     Family History: The patient's family history includes Cancer in her brother, father, and sister; Cerebrovascular Disease in her brother and brother; Diabetes in  her mother; Heart disease in her brother and brother; Stroke in her sister and sister. ROS:   Please see the history of present illness.    All other systems reviewed and are negative.  EKGs/Labs/Other Studies Reviewed:    The following studies were reviewed today:  EKG:  EKG ordered today.  The ekg ordered today demonstrates sinus rhythm low voltage otherwise normal  Recent Labs: No results found for requested labs within last 8760 hours.  Recent Lipid Panel    Component Value Date/Time   CHOL 237 (H) 04/03/2017 1210   TRIG 181 (H) 04/03/2017 1210   HDL 46 04/03/2017 1210   CHOLHDL 5.2 (H) 04/03/2017 1210   CHOLHDL 5.3 04/19/2009 1130   VLDL 23 04/19/2009 1130   LDLCALC 155 (H) 04/03/2017 1210    Physical Exam:    VS:  BP 108/80 (BP Location: Right Arm, Patient Position: Sitting, Cuff Size: Normal)   Pulse 93   Ht 5\' 2"  (1.575 m)   Wt 146 lb 9.6 oz (66.5 kg)   SpO2 97%   BMI 26.81 kg/m     Wt Readings from Last 3 Encounters:  03/11/18 146 lb 9.6 oz (66.5 kg)  10/10/17 144 lb (65.3 kg)  08/20/17 144 lb 1.9 oz (65.4 kg)     GEN:  Well nourished, well developed in no acute distress HEENT: Normal NECK: No JVD; No carotid bruits LYMPHATICS: No lymphadenopathy CARDIAC: RRR, no murmurs, rubs, gallops RESPIRATORY:  Clear to auscultation without rales, wheezing or rhonchi  ABDOMEN: Soft, non-tender, non-distended MUSCULOSKELETAL:  No edema; No deformity  SKIN: Warm and dry NEUROLOGIC:  Alert and oriented x 3 PSYCHIATRIC:  Normal affect    Signed, Shirlee More, MD  03/11/2018 10:48 AM    [place]

## 2018-03-11 ENCOUNTER — Ambulatory Visit (INDEPENDENT_AMBULATORY_CARE_PROVIDER_SITE_OTHER): Payer: Medicare Other | Admitting: Cardiology

## 2018-03-11 VITALS — BP 108/80 | HR 93 | Ht 62.0 in | Wt 146.6 lb

## 2018-03-11 DIAGNOSIS — D509 Iron deficiency anemia, unspecified: Secondary | ICD-10-CM | POA: Insufficient documentation

## 2018-03-11 DIAGNOSIS — D508 Other iron deficiency anemias: Secondary | ICD-10-CM

## 2018-03-11 DIAGNOSIS — I25119 Atherosclerotic heart disease of native coronary artery with unspecified angina pectoris: Secondary | ICD-10-CM

## 2018-03-11 DIAGNOSIS — Z789 Other specified health status: Secondary | ICD-10-CM | POA: Diagnosis not present

## 2018-03-11 DIAGNOSIS — E785 Hyperlipidemia, unspecified: Secondary | ICD-10-CM | POA: Diagnosis not present

## 2018-03-11 DIAGNOSIS — G2581 Restless legs syndrome: Secondary | ICD-10-CM | POA: Insufficient documentation

## 2018-03-11 HISTORY — DX: Restless legs syndrome: G25.81

## 2018-03-11 HISTORY — DX: Iron deficiency anemia, unspecified: D50.9

## 2018-03-11 LAB — FERRITIN: Ferritin: 54 ng/mL (ref 15–150)

## 2018-03-11 LAB — LIPID PANEL
CHOLESTEROL TOTAL: 162 mg/dL (ref 100–199)
Chol/HDL Ratio: 3.5 ratio (ref 0.0–4.4)
HDL: 46 mg/dL (ref >39)
LDL Calculated: 88 mg/dL (ref 0–99)
Triglycerides: 138 mg/dL (ref 0–149)
VLDL Cholesterol Cal: 28 mg/dL (ref 5–40)

## 2018-03-11 LAB — IRON AND TIBC
IRON SATURATION: 24 % (ref 15–55)
Iron: 62 ug/dL (ref 27–159)
Total Iron Binding Capacity: 263 ug/dL (ref 250–450)
UIBC: 201 ug/dL (ref 131–425)

## 2018-03-11 NOTE — Patient Instructions (Signed)
Medication Instructions:  Your physician recommends that you continue on your current medications as directed. Please refer to the Current Medication list given to you today.  Labwork: Your physician recommends that you have the following labs drawn: lipid panel, ferritin, and TIBC panel.  Testing/Procedures: None  Follow-Up: Your physician recommends that you schedule a follow-up appointment in: 1 year  Any Other Special Instructions Will Be Listed Below (If Applicable).   Increase sodium in your diet  If you need a refill on your cardiac medications before your next appointment, please call your pharmacy.   Charlotte Hall, RN, BSN

## 2018-03-12 ENCOUNTER — Telehealth: Payer: Self-pay

## 2018-03-12 NOTE — Telephone Encounter (Signed)
Attempted to reach patient regarding lab results, but there was no answer and no voicemail.  Will continue efforts.

## 2018-03-14 ENCOUNTER — Telehealth: Payer: Self-pay | Admitting: Cardiology

## 2018-03-14 DIAGNOSIS — E785 Hyperlipidemia, unspecified: Secondary | ICD-10-CM

## 2018-03-14 NOTE — Telephone Encounter (Signed)
Unable to leave message upon returning call. Will continue efforts.

## 2018-03-14 NOTE — Telephone Encounter (Signed)
Will follow up with this once clarification from Dr. Bettina Gavia is received. Patient aware.

## 2018-03-14 NOTE — Telephone Encounter (Signed)
Paitent called and her phone has been out and she wants results of labwork.

## 2018-03-19 MED ORDER — EZETIMIBE 10 MG PO TABS: 10.0000 mg | ORAL_TABLET | Freq: Every day | ORAL | 3 refills | Status: DC

## 2018-03-19 MED ORDER — CLOPIDOGREL BISULFATE 75 MG PO TABS: 75.0000 mg | ORAL_TABLET | Freq: Every day | ORAL | 3 refills | Status: DC

## 2018-03-19 NOTE — Addendum Note (Signed)
Addended by: Ashok Norris on: 03/19/2018 04:37 PM   Modules accepted: Orders

## 2018-03-19 NOTE — Telephone Encounter (Signed)
Patient informed to start Zetia 10 mg daily, and have lipid labs rechecked in one month. Patient verbally understands.

## 2018-04-08 DIAGNOSIS — Z85118 Personal history of other malignant neoplasm of bronchus and lung: Secondary | ICD-10-CM | POA: Diagnosis not present

## 2018-04-08 DIAGNOSIS — Z9221 Personal history of antineoplastic chemotherapy: Secondary | ICD-10-CM | POA: Diagnosis not present

## 2018-04-08 DIAGNOSIS — R918 Other nonspecific abnormal finding of lung field: Secondary | ICD-10-CM | POA: Diagnosis not present

## 2018-04-08 DIAGNOSIS — Z902 Acquired absence of lung [part of]: Secondary | ICD-10-CM

## 2018-04-10 NOTE — Progress Notes (Signed)
[address]       [place] [address]             [phone]                    [name] Medical Record #[id number] Date of Birth: 1961/06/17  Referring: Marice Potter, MD Primary Care: Charlotte Sanes, MD Primary Cardiologist: Shirlee More, MD  Chief Complaint:    Chief Complaint  Patient presents with  . Lung Lesion    Surgical eval, Chest CT 04/07/18  03/25/2015 OPERATIVE REPORT PREOPERATIVE DIAGNOSIS: Adenocarcinoma, right upper lobe, status post chemotherapy. POSTOPERATIVE DIAGNOSIS: Adenocarcinoma, right upper lobe, status post chemotherapy. SURGICAL PROCEDURE: 1. Video bronchoscopy. 2. Right video-assisted thoracoscopy with right upper lobectomy. 3. Lymph node dissection. 4. Placement of ON-Q. SURGEON: Lanelle Bal, M.D  Cancer Staging Lung cancer-right upper lobe Staging form: Lung, AJCC 7th Edition - Clinical: T2, N2, M0 - Signed by Grace Isaac, MD on 05/11/2015 - Pathologic stage from 03/29/2015: Stage IIA (yT1b, N1, cM0) - Signed by Grace Isaac, MD on 05/11/2015  History of Present Illness:    Michelle King 57 y.o. female is seen in the office  today for evaluation after recent CT scan of the chest.  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  he has been followed postoperatively with serial CT scans by Dr. Bobby Rumpf.  The patient notes that in January of this year she began having a dry nonproductive cough.  She denies any hemoptysis. She notes that the cough has become progressively worse over the past several months.      Current Activity/ Functional Status:  Patient is independent with mobility/ambulation, transfers, ADL's, IADL's.   Zubrod Score: At the time of surgery this patient's most appropriate activity status/level should be described as: []     0    Normal  activity, no symptoms [x]     1    Restricted in physical strenuous activity but ambulatory, able to do out light work []     2    Ambulatory and capable of self care, unable to do work activities, up and about               >50 % of waking hours                              []     3    Only limited self care, in bed greater than 50% of waking hours []     4    Completely disabled, no self care, confined to bed or chair []     5    Moribund   Past Medical History:  Diagnosis Date  . Angio-edema 09/05/2016  . CAD (coronary artery disease)   . Cancer ([place])    lung  . Chronic idiopathic urticaria 09/05/2016  . Complication of anesthesia   . COPD (chronic obstructive pulmonary disease) ([place])   . Coronary artery disease involving native coronary artery of native heart with angina pectoris [facility])    Overview:  Anterior MI 2010 with PCI and stent Her last stress test pharmacologic showed normal ejection fraction and no evidence of ischemia. She has a history of previous LAD infarction with severely depressed ejection fraction that normalized with medical therapy and revascularization percutaneously.  . Family history of  colon cancer   . Hematochezia 04/14/2015  . History of bilateral tubal ligation   . Hyperlipidemia   . Lung cancer-right upper lobe 11/15/2014  . Myocardial infarction ([place]) 2010  . Obstructive chronic bronchitis without exacerbation ([place]) 03/01/2015  . Pneumonia    hx  . PONV (postoperative nausea and vomiting)   . Preoperative cardiovascular examination 03/02/2015  . 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 Pure hypercholesterolemia 03/01/2015  . S/P hysterectomy   . S/P lobectomy of lung 03/25/2015  . Shortness of breath dyspnea     Past Surgical History:  Procedure Laterality Date  . APPENDECTOMY    . CARDIAC CATHETERIZATION     stents  . COLONOSCOPY N/A 04/18/2015   Procedure: COLONOSCOPY;  Surgeon: Ladene Artist, MD;  [facility] END[facility]COPY;  Service: Endoscopy;  Laterality: N/A;  .  MOUTH SURGERY     teeth  . TUBAL LIGATION    . VIDEO ASSISTED THORACOSCOPY (VATS)/WEDGE RESECTION Right 03/25/2015   Procedure: VIDEO ASSISTED THORACOSCOPY (VATS)/LUNG RESECTION;  Surgeon: Grace Isaac, MD;  Location: [place];  Service: Thoracic;  Laterality: Right;  Marland Kitchen VIDEO BRONCHOSCOPY N/A 03/25/2015   Procedure: VIDEO BRONCHOSCOPY;  Surgeon: Grace Isaac, MD;  Location: [facility] OR;  Service: Thoracic;  Laterality: N/A;  . VIDEO BRONCHOSCOPY WITH INSERTION OF INTERBRONCHIAL VALVE (IBV) N/A 04/12/2015   Procedure: VIDEO BRONCHOSCOPY WITH INSERTION OF 7MM INTERBRONCHIAL VALVES (IBV) IN SUPERIOR SEGMENT, MEDIAL AND POSTERIOR SEGMENT, AND LATERAL SEGMENT OF RIGHT LOWER LOBE;  Surgeon: Grace Isaac, MD;  Location: [facility] OR;  Service: Thoracic;  Laterality: N/A;  20mm Spiration Valve placed in Superior Segment, Medial and Posterior Segment, and Lateral Segment of Right Lower Lobe  . VIDEO BRONCHOSCOPY WITH INSERTION OF INTERBRONCHIAL VALVE (IBV) Right 04/19/2015   Procedure: VIDEO BRONCHOSCOPY WITH INSERTION OF INTERBRONCHIAL VALVE (IBV);  Surgeon: Grace Isaac, MD;  Location: [facility];  Service: Thoracic;  Laterality: Right;  Marland Kitchen VIDEO BRONCHOSCOPY WITH INSERTION OF INTERBRONCHIAL VALVE (IBV) N/A 06/03/2015   Procedure: VIDEO BRONCHOSCOPY WITH REMOVAL OF INTERBRONCHIAL VALVE (IBV);  Surgeon: Grace Isaac, MD;  Location: [facility] OR;  Service: Thoracic;  Laterality: N/A;    Family History  Problem Relation Age of Onset  . Diabetes Mother   . Cancer Father        colon  . Stroke Sister   . Cancer Brother        colon cancer  . Cancer Sister        lung cancer  . Heart disease Brother   . Cerebrovascular Disease Brother   . Heart disease Brother   . Cerebrovascular Disease Brother   . Stroke Sister      Social History   Tobacco Use  Smoking Status Former Smoker  . Packs/day: 0.50  . Years: 40.00  . Pack years: 20.00  . Types: Cigarettes  . Last attempt to quit: 03/25/2015  . Years  since quitting: 3.0  Smokeless Tobacco Never Used  Tobacco Comment   down to 4 aday    Social History   Substance and Sexual Activity  Alcohol Use No  . Alcohol/week: 0.0 standard drinks     Allergies  Allergen Reactions  . Codeine Itching and Swelling    Facial swelling Tolerates norco  . Rosuvastatin Other (See Comments)    Nose bleeds  . Sympathomimetics Other (See Comments)    Pt was not told what the reaction was    Current Outpatient Medications  Medication Sig Dispense Refill  . albuterol (PROVENTIL  HFA;VENTOLIN HFA) 108 (90 Base) MCG/ACT inhaler INHALE 2 PUFFS BY MOUTH AS NEEDED EVERY 4 TO 6 HOURS  3  . aspirin EC 81 MG tablet Take 81 mg by mouth daily.    . clopidogrel (PLAVIX) 75 MG tablet Take 1 tablet (75 mg total) by mouth daily. 90 tablet 3  . cycloSPORINE modified (NEORAL) 25 MG capsule Take 25 mg by mouth 2 (two) times daily.     . Evolocumab (REPATHA SURECLICK) 161 MG/ML SOAJ Inject 140 mg into the skin every 14 (fourteen) days. 2 pen 11  . ezetimibe (ZETIA) 10 MG tablet Take 1 tablet (10 mg total) by mouth daily. 90 tablet 3  . gabapentin (NEURONTIN) 100 MG capsule Take 100 mg by mouth 2 (two) times daily.     . SYMBICORT 160-4.5 MCG/ACT inhaler Inhale 2 puffs into the lungs 2 (two) times daily.  3   Current Facility-Administered Medications  Medication Dose Route Frequency Provider Last Rate Last Dose  . omalizumab Arvid Right) injection 300 mg  300 mg Subcutaneous Q28 days Jiles Prows, MD   300 mg at 08/30/16 1013      Review of Systems:     Cardiac Review of Systems: [Y] = yes  or   [ N ] = no   Chest Pain [ n   ]  Resting SOB [  n ] Exertional SOB  [ y ]  Cardell Peach  ]   Pedal Edema [ n  ]    Palpitations [ n ] Syncope  [ n ]   Presyncope [n   ]   General Review of Systems: [Y] = yes [  ]=no Constitional: recent weight change [  ];  Wt loss over the last 3 months [   ] anorexia [  ]; fatigue [  ]; nausea [  ]; night sweats [  ]; fever [  ]; or  chills [  ];           Eye : blurred vision [  ]; diplopia [   ]; vision changes [  ];  Amaurosis fugax[  ]; Resp: cough [  y];  wheezing[ y ];  hemoptysis[  ]; shortness of breath[y  ]; paroxysmal nocturnal dyspnea[  ]; dyspnea on exertion[  ]; or orthopnea[  ];  GI:  gallstones[  ], vomiting[  ];  dysphagia[  ]; melena[  ];  hematochezia [  ]; heartburn[  ];   Hx of  Colonoscopy[  ]; GU: kidney stones [  ]; hematuria[  ];   dysuria [  ];  nocturia[  ];  history of     obstruction [  ]; urinary frequency [  ]             Skin: rash, swelling[  ];, hair loss[  ];  peripheral edema[  ];  or itching[  ]; Musculosketetal: myalgias[  ];  joint swelling[  ];  joint erythema[  ];  joint pain[  ];  back pain[  ];  Heme/Lymph: bruising[  ];  bleeding[  ];  anemia[  ];  Neuro: TIA[  ];  headaches[  ];  stroke[  ];  vertigo[  ];  seizures[  ];   paresthesias[  ];  difficulty walking[  ];  Psych:depression[  ]; anxiety[ y ];  Endocrine: diabetes[  ];  thyroid dysfunction[  ];  Immunizations: Flu up to date Blue.Reese  ]; Pneumococcal up to date Blue.Reese  ];  Other:    PHYSICAL  EXAMINATION: BP 140/78   Pulse 88   Resp 20   Ht 5\' 2"  (1.575 m)   Wt 145 lb (65.8 kg)   SpO2 98% Comment: RA  BMI 26.52 kg/m  General appearance: alert, cooperative and no distress Head: Normocephalic, without obvious abnormality, atraumatic Neck: no adenopathy, no carotid bruit, no JVD, supple, symmetrical, trachea midline and thyroid not enlarged, symmetric, no tenderness/mass/nodules Lymph nodes: Cervical, supraclavicular, and axillary nodes normal. Resp: clear to auscultation bilaterally Back: symmetric, no curvature. ROM normal. No CVA tenderness. Cardio: regular rate and rhythm, S1, S2 normal, no murmur, click, rub or gallop GI: soft, non-tender; bowel sounds normal; no masses,  no organomegaly Extremities: extremities normal, atraumatic, no cyanosis or edema and Homans sign is negative, no sign of DVT Neurologic: Grossly  normal  Diagnostic Studies & Laboratory data:     Recent Radiology Findings:   CLINICAL DATA: Right lung cancer, chemotherapy complete.  EXAM: CT CHEST WITH CONTRAST  TECHNIQUE: Multidetector CT imaging of the chest was performed during intravenous contrast administration.  CONTRAST: 60 cc Isovue 370.  COMPARISON: 04/04/2017.  FINDINGS: Cardiovascular: Atherosclerotic calcification of the arterial vasculature, including coronary arteries. Heart is mildly enlarged with left ventricular dilatation. No pericardial effusion.  Mediastinum/Nodes: Low right paratracheal adenopathy measures approximately 2.1 x 3.1 cm, increased from 9 mm in short axis. AP window lymph node measures 10 mm, previously 6 mm. Hilar lymph nodes are not enlarged by CT size criteria. No axillary adenopathy. Esophagus is grossly unremarkable.  Lungs/Pleura: Centrilobular emphysema. Right upper lobectomy. Volume loss in the right middle lobe. No pleural fluid. Airway is otherwise unremarkable.  Upper Abdomen: Visualized portions of the liver, gallbladder, adrenal glands, kidneys, spleen, pancreas, stomach and bowel are grossly unremarkable. Favor focal fat along the gallbladder fossa. No upper abdominal adenopathy.  Musculoskeletal: No worrisome lytic or sclerotic lesions.  IMPRESSION: 1. New mediastinal adenopathy, indicative of disease recurrence. 2. Aortic atherosclerosis (ICD10-170.0). Coronary artery calcification.   Electronically Signed By: Lorin Picket M.D. On: 04/07/2018 14:29   I have independently reviewed the above radiology studies  and reviewed the findings with the patient.   Recent Lab Findings: Lab Results  Component Value Date   WBC 7.6 08/10/2016   HGB 10.2 (L) 08/10/2016   HCT 33.6 (L) 08/10/2016   PLT 413 (H) 08/10/2016   GLUCOSE 90 02/20/2017   CHOL 162 03/11/2018   TRIG 138 03/11/2018   HDL 46 03/11/2018   LDLCALC 88 03/11/2018   ALT 11 02/20/2017   AST 16  02/20/2017   NA 142 02/20/2017   K 4.3 02/20/2017   CL 101 02/20/2017   CREATININE 0.49 (L) 02/20/2017   BUN 15 02/20/2017   CO2 24 02/20/2017   TSH 1.740 06/14/2016   INR 1.10 06/03/2015      Assessment / Plan:   Patient is status post presentation with a clinical stage IIIa adenocarcinoma of the right upper lobe and 2016.  She was treated with a course of chemotherapy preoperatively and underwent right upper lobectomy with node dissection, pathologic stage IIa.  She has been followed since that time with serial CT scans.  Most recent CT scan indicates possible mediastinal recurrence with enlarging lymph nodes.  In addition she has had significant cough over the past several months.  I reviewed her films with her and recommend that we proceed with obtaining a PET scan and soon after if there are no other findings for biopsy performed bronchoscopy with endobronchial ultrasound and trans-bronchial biopsy of mediastinal nodes.  She is agreeable with this approach  Currently scan is scheduled for 10/3, her Plavix will be held for 5 days and proceed with bronchoscopy with ebus and biopsy on 10/4 unless the PET scan reveals other disease more amenable to biopsy.   The current treatment plan to obtain tissue diagnosis was discussed with the patient and her husband in detail, there questions have been answered.      Grace Isaac MD      [address] [place] [address] Office [phone]   [place] [phone]  04/11/2018 12:11 PM

## 2018-04-11 ENCOUNTER — Other Ambulatory Visit: Payer: Self-pay | Admitting: *Deleted

## 2018-04-11 ENCOUNTER — Encounter: Payer: Self-pay | Admitting: Cardiothoracic Surgery

## 2018-04-11 ENCOUNTER — Other Ambulatory Visit: Payer: Self-pay

## 2018-04-11 ENCOUNTER — Ambulatory Visit (INDEPENDENT_AMBULATORY_CARE_PROVIDER_SITE_OTHER): Payer: Medicare Other | Admitting: Cardiothoracic Surgery

## 2018-04-11 VITALS — BP 140/78 | HR 88 | Resp 20 | Ht 62.0 in | Wt 145.0 lb

## 2018-04-11 DIAGNOSIS — C3411 Malignant neoplasm of upper lobe, right bronchus or lung: Secondary | ICD-10-CM | POA: Diagnosis not present

## 2018-04-11 DIAGNOSIS — Z85118 Personal history of other malignant neoplasm of bronchus and lung: Secondary | ICD-10-CM

## 2018-04-11 DIAGNOSIS — Z902 Acquired absence of lung [part of]: Secondary | ICD-10-CM

## 2018-04-11 DIAGNOSIS — R590 Localized enlarged lymph nodes: Secondary | ICD-10-CM

## 2018-04-11 DIAGNOSIS — Z01818 Encounter for other preprocedural examination: Secondary | ICD-10-CM

## 2018-04-11 DIAGNOSIS — I25119 Atherosclerotic heart disease of native coronary artery with unspecified angina pectoris: Secondary | ICD-10-CM | POA: Diagnosis not present

## 2018-04-11 DIAGNOSIS — J9859 Other diseases of mediastinum, not elsewhere classified: Secondary | ICD-10-CM

## 2018-04-14 ENCOUNTER — Encounter: Payer: Self-pay | Admitting: Oncology

## 2018-04-15 ENCOUNTER — Other Ambulatory Visit: Payer: Medicare Other

## 2018-04-16 NOTE — Pre-Procedure Instructions (Signed)
[place]  04/16/2018      [facility] - [address], [place] [address] Phone: [phone] Fax: [place], [place] [address] [place] [address] Phone: [phone] Fax: [phone]    Your procedure is scheduled on Friday October 4th.  Report to [facility] Admitting at [place].M.  Call this number if you have problems the morning of surgery:  [phone]   Remember:  Do not eat or drink after midnight.    Take these medicines the morning of surgery with A SIP OF WATER   Albuterol (if needed) Bring inhaler with you  Gabapentin  Symbicort   7 days prior to surgery STOP taking any Aspirin(unless otherwise instructed by your surgeon), Aleve, Naproxen, Ibuprofen, Motrin, Advil, Goody's, BC's, all herbal medications, fish oil, and all vitamins     Do not wear jewelry, make-up or nail polish.  Do not wear lotions, powders, or perfumes, or deodorant.  Do not shave 48 hours prior to surgery.  Men may shave face and neck.  Do not bring valuables to the hospital.  [facility] is not responsible for any belongings or valuables.  Contacts, dentures or bridgework may not be worn into surgery.  Leave your suitcase in the car.  After surgery it may be brought to your room.  For patients admitted to the hospital, discharge time will be determined by your treatment team.  Patients discharged the day of surgery will not be allowed to drive home.    [place]- Preparing For Surgery  Before surgery, you can play an important role. Because skin is not sterile, your skin needs to be as free of germs as possible. You can reduce the number of germs on your skin by washing with CHG (chlorahexidine gluconate) Soap before surgery.  CHG is an antiseptic cleaner which kills germs and bonds with the skin to continue killing germs even after  washing.    Oral Hygiene is also important to reduce your risk of infection.  Remember - BRUSH YOUR TEETH THE MORNING OF SURGERY WITH YOUR REGULAR TOOTHPASTE  Please do not use if you have an allergy to CHG or antibacterial soaps. If your skin becomes reddened/irritated stop using the CHG.  Do not shave (including legs and underarms) for at least 48 hours prior to first CHG shower. It is OK to shave your face.  Please follow these instructions carefully.   1. Shower the NIGHT BEFORE SURGERY and the MORNING OF SURGERY with CHG.   2. If you chose to wash your hair, wash your hair first as usual with your normal shampoo.  3. After you shampoo, rinse your hair and body thoroughly to remove the shampoo.  4. Use CHG as you would any other liquid soap. You can apply CHG directly to the skin and wash gently with a scrungie or a clean washcloth.   5. Apply the CHG Soap to your body ONLY FROM THE NECK DOWN.  Do not use on open wounds or open sores. Avoid contact with your eyes, ears, mouth and genitals (private parts). Wash Face and genitals (private parts)  with your normal soap.  6. Wash thoroughly, paying special attention to the area where your surgery will be performed.  7. Thoroughly rinse your body with warm water from the neck down.  8. DO NOT shower/wash with your normal soap after  using and rinsing off the CHG Soap.  9. Pat yourself dry with a CLEAN TOWEL.  10. Wear CLEAN PAJAMAS to bed the night before surgery, wear comfortable clothes the morning of surgery  11. Place CLEAN SHEETS on your bed the night of your first shower and DO NOT SLEEP WITH PETS.    Day of Surgery:  Do not apply any deodorants/lotions.  Please wear clean clothes to the hospital/surgery center.   Remember to brush your teeth WITH YOUR REGULAR TOOTHPASTE.    Please read over the following fact sheets that you were given.

## 2018-04-17 ENCOUNTER — Encounter (HOSPITAL_COMMUNITY): Payer: Self-pay

## 2018-04-17 ENCOUNTER — Encounter (HOSPITAL_COMMUNITY)
Admission: RE | Admit: 2018-04-17 | Discharge: 2018-04-17 | Disposition: A | Discharge status: Home / Self Care | Payer: Medicare Other | Source: Ambulatory Visit | Attending: Cardiothoracic Surgery | Admitting: Cardiothoracic Surgery

## 2018-04-17 ENCOUNTER — Ambulatory Visit (HOSPITAL_COMMUNITY)
Admission: RE | Admit: 2018-04-17 | Discharge: 2018-04-17 | Disposition: A | Discharge status: Home / Self Care | Payer: Medicare Other | Source: Ambulatory Visit | Attending: Cardiothoracic Surgery | Admitting: Cardiothoracic Surgery

## 2018-04-17 ENCOUNTER — Other Ambulatory Visit: Payer: Self-pay

## 2018-04-17 DIAGNOSIS — Z85118 Personal history of other malignant neoplasm of bronchus and lung: Secondary | ICD-10-CM | POA: Insufficient documentation

## 2018-04-17 DIAGNOSIS — Z01818 Encounter for other preprocedural examination: Secondary | ICD-10-CM | POA: Diagnosis not present

## 2018-04-17 DIAGNOSIS — J9859 Other diseases of mediastinum, not elsewhere classified: Secondary | ICD-10-CM | POA: Insufficient documentation

## 2018-04-17 DIAGNOSIS — I2510 Atherosclerotic heart disease of native coronary artery without angina pectoris: Secondary | ICD-10-CM | POA: Insufficient documentation

## 2018-04-17 DIAGNOSIS — J439 Emphysema, unspecified: Secondary | ICD-10-CM | POA: Insufficient documentation

## 2018-04-17 DIAGNOSIS — R590 Localized enlarged lymph nodes: Secondary | ICD-10-CM

## 2018-04-17 DIAGNOSIS — I700 Atherosclerosis of aorta: Secondary | ICD-10-CM | POA: Insufficient documentation

## 2018-04-17 DIAGNOSIS — Z902 Acquired absence of lung [part of]: Secondary | ICD-10-CM | POA: Insufficient documentation

## 2018-04-17 HISTORY — DX: Unspecified osteoarthritis, unspecified site: M19.90

## 2018-04-17 LAB — CBC
HCT: 46.3 % — ABNORMAL HIGH (ref 36.0–46.0)
Hemoglobin: 14.7 g/dL (ref 12.0–15.0)
MCH: 27.6 pg (ref 26.0–34.0)
MCHC: 31.7 g/dL (ref 30.0–36.0)
MCV: 87 fL (ref 78.0–100.0)
Platelets: 337 10*3/uL (ref 150–400)
RBC: 5.32 MIL/uL — ABNORMAL HIGH (ref 3.87–5.11)
RDW: 12.4 % (ref 11.5–15.5)
WBC: 6.7 10*3/uL (ref 4.0–10.5)

## 2018-04-17 LAB — COMPREHENSIVE METABOLIC PANEL
ALT: 19 U/L (ref 0–44)
AST: 19 U/L (ref 15–41)
Albumin: 4.1 g/dL (ref 3.5–5.0)
Alkaline Phosphatase: 84 U/L (ref 38–126)
Anion gap: 8 (ref 5–15)
BUN: 11 mg/dL (ref 6–20)
CO2: 26 mmol/L (ref 22–32)
Calcium: 9.4 mg/dL (ref 8.9–10.3)
Chloride: 104 mmol/L (ref 98–111)
Creatinine, Ser: 0.57 mg/dL (ref 0.44–1.00)
GFR calc Af Amer: >60 mL/min (ref >60)
GFR calc non Af Amer: >60 mL/min (ref >60)
Glucose, Bld: 102 mg/dL — ABNORMAL HIGH (ref 70–99)
Potassium: 4 mmol/L (ref 3.5–5.1)
Sodium: 138 mmol/L (ref 135–145)
Total Bilirubin: 0.7 mg/dL (ref 0.3–1.2)
Total Protein: 7.2 g/dL (ref 6.5–8.1)

## 2018-04-17 LAB — LIPID PANEL
CHOL/HDL RATIO: 2.7 ratio (ref 0.0–4.4)
Cholesterol, Total: 115 mg/dL (ref 100–199)
HDL: 43 mg/dL (ref >39)
LDL Calculated: 49 mg/dL (ref 0–99)
TRIGLYCERIDES: 117 mg/dL (ref 0–149)
VLDL CHOLESTEROL CAL: 23 mg/dL (ref 5–40)

## 2018-04-17 LAB — GLUCOSE, CAPILLARY: Glucose-Capillary: 99 mg/dL (ref 70–99)

## 2018-04-17 LAB — PROTIME-INR
INR: 0.95
Prothrombin Time: 12.6 seconds (ref 11.4–15.2)

## 2018-04-17 LAB — SURGICAL PCR SCREEN
MRSA, PCR: NEGATIVE
Staphylococcus aureus: NEGATIVE

## 2018-04-17 LAB — APTT: aPTT: 27 seconds (ref 24–36)

## 2018-04-17 MED ORDER — FLUDEOXYGLUCOSE F - 18 (FDG) INJECTION
7.0000 | Freq: Once | INTRAVENOUS | Status: AC
  Administered 2018-04-17: 7 via INTRAVENOUS

## 2018-04-17 NOTE — Anesthesia Preprocedure Evaluation (Addendum)
Anesthesia Evaluation  Patient identified by MRN, date of birth, ID band Patient awake    Reviewed: Allergy & Precautions, NPO status , Patient's Chart, lab work & pertinent test results  History of Anesthesia Complications (+) PONV and history of anesthetic complications  Airway Mallampati: II   Neck ROM: Full    Dental  (+) Missing, Poor Dentition, Dental Advisory Given   Pulmonary shortness of breath, pneumonia, COPD, Current Smoker, former smoker,    breath sounds clear to auscultation       Cardiovascular + angina + CAD and + Past MI   Rhythm:Regular Rate:Normal     Neuro/Psych negative neurological ROS     GI/Hepatic Neg liver ROS,   Endo/Other  negative endocrine ROS  Renal/GU negative Renal ROS     Musculoskeletal  (+) Arthritis ,   Abdominal   Peds  Hematology  (+) anemia ,   Anesthesia Other Findings   Reproductive/Obstetrics                             Anesthesia Physical  Anesthesia Plan  ASA: III  Anesthesia Plan: General   Post-op Pain Management:    Induction: Intravenous  PONV Risk Score and Plan: 4 or greater and Ondansetron, Dexamethasone, Treatment may vary due to age or medical condition and Midazolam  Airway Management Planned: Oral ETT  Additional Equipment:   Intra-op Plan:   Post-operative Plan: Extubation in OR and Possible Post-op intubation/ventilation  Informed Consent: I have reviewed the patients History and Physical, chart, labs and discussed the procedure including the risks, benefits and alternatives for the proposed anesthesia with the patient or authorized representative who has indicated his/her understanding and acceptance.   Dental advisory given  Plan Discussed with: CRNA  Anesthesia Plan Comments:                                           Anesthesia Evaluation  Patient identified by MRN, date of birth, ID band Patient  awake    Reviewed: Allergy & Precautions, NPO status , Patient's Chart, lab work & pertinent test results  Airway Mallampati: II  TM Distance: >3 FB Neck ROM: Full    Dental  (+) Partial Lower, Edentulous Upper   Pulmonary COPD, Current Smoker,    breath sounds clear to auscultation- rhonchi       Cardiovascular + CAD and + Past MI   Rhythm:Regular Rate:Normal  EF 50%   Neuro/Psych negative neurological ROS     GI/Hepatic negative GI ROS, Neg liver ROS,   Endo/Other  negative endocrine ROS  Renal/GU negative Renal ROS     Musculoskeletal   Abdominal   Peds  Hematology  (+) anemia ,   Anesthesia Other Findings   Reproductive/Obstetrics                            Lab Results  Component Value Date   WBC 6.7 04/17/2018   HGB 14.7 04/17/2018   HCT 46.3 (H) 04/17/2018   MCV 87.0 04/17/2018   PLT 337 04/17/2018   Lab Results  Component Value Date   CREATININE 0.57 04/17/2018   BUN 11 04/17/2018   NA 138 04/17/2018   K 4.0 04/17/2018   CL 104 04/17/2018   CO2 26 04/17/2018    Anesthesia Physical  Anesthesia Plan  ASA: III  Anesthesia Plan: General   Post-op Pain Management:    Induction: Intravenous  Airway Management Planned: Oral ETT  Additional Equipment:   Intra-op Plan:   Post-operative Plan: Extubation in OR  Informed Consent: I have reviewed the patients History and Physical, chart, labs and discussed the procedure including the risks, benefits and alternatives for the proposed anesthesia with the patient or authorized representative who has indicated his/her understanding and acceptance.   Dental advisory given  Plan Discussed with: CRNA  Anesthesia Plan Comments:        Anesthesia Quick Evaluation  Anesthesia Quick Evaluation

## 2018-04-17 NOTE — Progress Notes (Signed)
Anesthesia Chart Review:  Case:  [id number] Date/Time:  04/18/18 0715   Procedure:  VIDEO BRONCHOSCOPY WITH ENDOBRONCHIAL ULTRASOUND (N/A )   Anesthesia type:  General   Pre-op diagnosis:  mediastinal adenopathy   Location:  [facility] OR ROOM 14 / [place] OR   Surgeon:  Grace Isaac, MD      DISCUSSION: Patient is a 57 year old female scheduled for the above procedure. She has history of lung cancer '16 with recent imaging revealing new mediastinal adenopathy.   History includes right lung cancer (s/p chemoradiation; s/p RU lobectomy with LN dissection 03/25/15; persistent airleak s/p pulmonary intrabronchial valves 04/12/15 & 104/16, s/p removal 06/03/15), CAD (MI with total occlusion of proximal LAD s/p PTCA/BMS 04/19/09), ischemic cardiomyopathy '10 (EF 20% after 2010 MI, 45-50% 02/2015), COPD, HLD, SOB, post-operative N/V, angioedema.   No new cardiac testing recommended following cardiology follow-up in August. Dr. Servando Snare instructed her to hold Plavix for 5 days. She reported holding ASA and Plavix x 1 week.   PET scan report from today is still pending. Based on currently available information, I would anticipate that she can proceed as planned if no acute changes.   VS: BP 119/79   Pulse 88   Temp 36.7 C   Resp 20   Ht 5\' 2"  (1.575 m)   Wt 65 kg   SpO2 98%   BMI 26.19 kg/m   PROVIDERS: Charlotte Sanes, MD is PCP - Shirlee More, MD is cardiologist. Last visit 03/11/18. CAD felt stable and no ischemic evaluation recommended at that time. One year follow-up. Lavera Guise, MD is HEM-ONC with [facility].    LABS: Labs reviewed: Acceptable for surgery. (all labs ordered are listed, but only abnormal results are displayed)  Labs Reviewed  CBC - Abnormal; Notable for the following components:      Result Value   RBC 5.32 (*)    HCT 46.3 (*)    All other components within normal limits  COMPREHENSIVE METABOLIC PANEL - Abnormal; Notable for the following  components:   Glucose, Bld 102 (*)    All other components within normal limits  SURGICAL PCR SCREEN  APTT  PROTIME-INR    IMAGES: CXR 04/17/18:  IMPRESSION: No active cardiopulmonary disease.   PET Scan 04/17/18: Pending.   EKG: 03/11/18 ([facility]): NSR, low voltage QRS. Inferior infarct (age undetermined). Cannot rule out anterior infarct (age undetermined). EKG appears stable when compared to 02/20/17 tracing.   CV: Cardiac cath 06/11/16 ([facility] Care Everywhere): Conclusions Diagnostic Procedure Summary 1. Mild non-obstructive CAD (40% pLAD, 40% mCX, 30% mRCA, 40% dRCA). Prior LAD stent with mild instent restenosis 2. Normal LV systolic function, LVEDP 17 mmHg. Pt requested right femoral approach Diagnostic Procedure Recommendations 1. Medical treatment. Her symptom is mainly SOB, with elev LVEDP, will start Lasix 20 mg daily and KCL 20 meq daily. Follow up with Dr. Bettina Gavia  Echo 03/07/15 ([facility]; scanned under Media tab, Correspondence 03/25/15):  LV cavity is normal is size. Mild decrease in global wall motion. EF 45-50%. Trileaflet AV with no regurgitation. Mild AV leaflet calcification. Structurally normal MV with trace MR. Structurally normal TV with mild TR.  Nuclear stress test 02/18/14 ([facility]; scanned under Media tab, Correspondence 03/25/15): Myocardial perfusion imaging showed no evidence of ischemia. Overall LV systolic function was normal without regional wll motion abnormalities. The LVEF was 56%.   Past Medical History:  Diagnosis Date  . Angio-edema 09/05/2016  . Arthritis    spine  .  CAD (coronary artery disease)   . Cancer ([place])    lung  . Chronic idiopathic urticaria 09/05/2016  . Complication of anesthesia   . COPD (chronic obstructive pulmonary disease) ([place])   . Coronary artery disease involving native coronary artery of native heart with angina pectoris [facility])    Overview:  Anterior MI 2010 with PCI and stent Her last stress test  pharmacologic showed normal ejection fraction and no evidence of ischemia. She has a history of previous LAD infarction with severely depressed ejection fraction that normalized with medical therapy and revascularization percutaneously.  . Family history of colon cancer   . Hematochezia 04/14/2015  . History of bilateral tubal ligation   . Hyperlipidemia   . Lung cancer-right upper lobe 11/15/2014  . Myocardial infarction ([place]) 2010  . Obstructive chronic bronchitis without exacerbation ([place]) 03/01/2015  . Pneumonia    hx  . PONV (postoperative nausea and vomiting)   . Preoperative cardiovascular examination 03/02/2015  . 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 Pure hypercholesterolemia 03/01/2015  . S/P hysterectomy   . S/P lobectomy of lung 03/25/2015  . Shortness of breath dyspnea     Past Surgical History:  Procedure Laterality Date  . APPENDECTOMY    . CARDIAC CATHETERIZATION     stents  . COLONOSCOPY N/A 04/18/2015   Procedure: COLONOSCOPY;  Surgeon: Ladene Artist, MD;  Location: [facility] ENDOSCOPY;  Service: Endoscopy;  Laterality: N/A;  . MOUTH SURGERY     teeth  . TUBAL LIGATION    . VIDEO ASSISTED THORACOSCOPY (VATS)/WEDGE RESECTION Right 03/25/2015   Procedure: VIDEO ASSISTED THORACOSCOPY (VATS)/LUNG RESECTION;  Surgeon: Grace Isaac, MD;  Location: [place];  Service: Thoracic;  Laterality: Right;  Marland Kitchen VIDEO BRONCHOSCOPY N/A 03/25/2015   Procedure: VIDEO BRONCHOSCOPY;  Surgeon: Grace Isaac, MD;  [facility];  Service: Thoracic;  Laterality: N/A;  . VIDEO BRONCHOSCOPY WITH INSERTION OF INTERBRONCHIAL VALVE (IBV) N/A 04/12/2015   Procedure: VIDEO BRONCHOSCOPY WITH INSERTION OF 7MM INTERBRONCHIAL VALVES (IBV) IN SUPERIOR SEGMENT, MEDIAL AND POSTERIOR SEGMENT, AND LATERAL SEGMENT OF RIGHT LOWER LOBE;  Surgeon: Grace Isaac, MD;  Location[facility] OR;  Service: Thoracic;  Laterality: N/A;  20mm Spiration Valve placed in Superior Segment, Medial and Posterior Segment, and Lateral Segment of  Right Lower Lobe  . VIDEO BRONCHOSCOPY WITH INSERTION OF INTERBRONCHIAL VALVE (IBV) Right 04/19/2015   Procedure: VIDEO BRONCHOSCOPY WITH INSERTION OF INTERBRONCHIAL VALVE (IBV);  Surgeon: Grace Isaac, MD;  Location: [place];  Service: Thoracic;  Laterality: Right;  Marland Kitchen VIDEO BRONCHOSCOPY WITH INSERTION OF INTERBRONCHIAL VALVE (IBV) N/A 06/03/2015   Procedure: VIDEO BRONCHOSCOPY WITH REMOVAL OF INTERBRONCHIAL VALVE (IBV);  Surgeon: Grace Isaac, MD;  Location: [facility] OR;  Service: Thoracic;  Laterality: N/A;    MEDICATIONS: . albuterol (PROVENTIL HFA;VENTOLIN HFA) 108 (90 Base) MCG/ACT inhaler  . aspirin EC 81 MG tablet  . clopidogrel (PLAVIX) 75 MG tablet  . cycloSPORINE modified (NEORAL) 25 MG capsule  . Evolocumab (REPATHA SURECLICK) 625 MG/ML SOAJ  . ezetimibe (ZETIA) 10 MG tablet  . gabapentin (NEURONTIN) 100 MG capsule  . ibuprofen (ADVIL,MOTRIN) 200 MG tablet  . Promethazine-DM 6.25-15 MG/5ML SOLN  . SYMBICORT 160-4.5 MCG/ACT inhaler   . omalizumab Arvid Right) injection 300 mg    [facility] Short Stay Center/Anesthesiology Phone [phone] 04/17/2018 4:10 PM

## 2018-04-17 NOTE — Progress Notes (Signed)
PCP - Charlotte Sanes MD Cardiologist - Dr. Bettina Gavia  Chest x-ray - 04/17/18 EKG - 02/2018 Stress Test - 2017 ECHO -  >10 yrs Cardiac Cath -  2017  Blood Thinner Instructions: Has been off for a week Aspirin Instructions:  Has been off for a week  Anesthesia review: Cardiac Clearance, request outside cardiac documents   Patient denies shortness of breath, fever, cough and chest pain at PAT appointment   Patient verbalized understanding of instructions that were given to them at the PAT appointment. Patient was also instructed that they will need to review over the PAT instructions again at home before surgery.

## 2018-04-18 ENCOUNTER — Encounter (HOSPITAL_COMMUNITY)
Admission: RE | Disposition: A | Discharge status: Home / Self Care | Payer: Self-pay | Source: Ambulatory Visit | Attending: Cardiothoracic Surgery

## 2018-04-18 ENCOUNTER — Ambulatory Visit (HOSPITAL_COMMUNITY): Payer: Medicare Other

## 2018-04-18 ENCOUNTER — Ambulatory Visit (HOSPITAL_COMMUNITY)
Admission: RE | Admit: 2018-04-18 | Discharge: 2018-04-18 | Disposition: A | Discharge status: Home / Self Care | Payer: Medicare Other | Source: Ambulatory Visit | Attending: Cardiothoracic Surgery | Admitting: Cardiothoracic Surgery

## 2018-04-18 ENCOUNTER — Ambulatory Visit (HOSPITAL_COMMUNITY): Payer: Medicare Other | Admitting: Anesthesiology

## 2018-04-18 ENCOUNTER — Ambulatory Visit (HOSPITAL_COMMUNITY): Payer: Medicare Other | Admitting: Vascular Surgery

## 2018-04-18 ENCOUNTER — Encounter (HOSPITAL_COMMUNITY): Payer: Self-pay

## 2018-04-18 DIAGNOSIS — R590 Localized enlarged lymph nodes: Secondary | ICD-10-CM

## 2018-04-18 DIAGNOSIS — M1990 Unspecified osteoarthritis, unspecified site: Secondary | ICD-10-CM | POA: Diagnosis not present

## 2018-04-18 DIAGNOSIS — J449 Chronic obstructive pulmonary disease, unspecified: Secondary | ICD-10-CM | POA: Insufficient documentation

## 2018-04-18 DIAGNOSIS — I700 Atherosclerosis of aorta: Secondary | ICD-10-CM | POA: Insufficient documentation

## 2018-04-18 DIAGNOSIS — Z885 Allergy status to narcotic agent status: Secondary | ICD-10-CM | POA: Diagnosis not present

## 2018-04-18 DIAGNOSIS — I252 Old myocardial infarction: Secondary | ICD-10-CM | POA: Diagnosis not present

## 2018-04-18 DIAGNOSIS — C3401 Malignant neoplasm of right main bronchus: Secondary | ICD-10-CM | POA: Diagnosis not present

## 2018-04-18 DIAGNOSIS — Z79899 Other long term (current) drug therapy: Secondary | ICD-10-CM | POA: Insufficient documentation

## 2018-04-18 DIAGNOSIS — E785 Hyperlipidemia, unspecified: Secondary | ICD-10-CM | POA: Diagnosis not present

## 2018-04-18 DIAGNOSIS — I2510 Atherosclerotic heart disease of native coronary artery without angina pectoris: Secondary | ICD-10-CM | POA: Diagnosis not present

## 2018-04-18 DIAGNOSIS — Z7982 Long term (current) use of aspirin: Secondary | ICD-10-CM | POA: Insufficient documentation

## 2018-04-18 DIAGNOSIS — Z87891 Personal history of nicotine dependence: Secondary | ICD-10-CM | POA: Insufficient documentation

## 2018-04-18 DIAGNOSIS — Z9221 Personal history of antineoplastic chemotherapy: Secondary | ICD-10-CM | POA: Insufficient documentation

## 2018-04-18 DIAGNOSIS — Z85118 Personal history of other malignant neoplasm of bronchus and lung: Secondary | ICD-10-CM | POA: Diagnosis not present

## 2018-04-18 DIAGNOSIS — T819XXA Unspecified complication of procedure, initial encounter: Secondary | ICD-10-CM

## 2018-04-18 DIAGNOSIS — Z888 Allergy status to other drugs, medicaments and biological substances status: Secondary | ICD-10-CM | POA: Diagnosis not present

## 2018-04-18 DIAGNOSIS — M479 Spondylosis, unspecified: Secondary | ICD-10-CM | POA: Diagnosis not present

## 2018-04-18 HISTORY — PX: VIDEO BRONCHOSCOPY WITH ENDOBRONCHIAL ULTRASOUND: SHX6177

## 2018-04-18 SURGERY — BRONCHOSCOPY, WITH EBUS
Anesthesia: General

## 2018-04-18 MED ORDER — MEPERIDINE HCL 50 MG/ML IJ SOLN: 6.2500 mg | INTRAMUSCULAR | Status: DC | PRN

## 2018-04-18 MED ORDER — EPINEPHRINE PF 1 MG/ML IJ SOLN
INTRAMUSCULAR | Status: AC
  Filled 2018-04-18: qty 1

## 2018-04-18 MED ORDER — ROCURONIUM BROMIDE 50 MG/5ML IV SOSY
PREFILLED_SYRINGE | INTRAVENOUS | Status: AC
  Filled 2018-04-18: qty 5

## 2018-04-18 MED ORDER — LIDOCAINE 2% (20 MG/ML) 5 ML SYRINGE
INTRAMUSCULAR | Status: AC
  Filled 2018-04-18: qty 5

## 2018-04-18 MED ORDER — PROPOFOL 10 MG/ML IV BOLUS
INTRAVENOUS | Status: AC
  Filled 2018-04-18: qty 20

## 2018-04-18 MED ORDER — MIDAZOLAM HCL 2 MG/2ML IJ SOLN
INTRAMUSCULAR | Status: AC
  Filled 2018-04-18: qty 2

## 2018-04-18 MED ORDER — ROCURONIUM BROMIDE 50 MG/5ML IV SOSY
PREFILLED_SYRINGE | INTRAVENOUS | Status: DC | PRN
  Administered 2018-04-18: 10 mg via INTRAVENOUS
  Administered 2018-04-18: 50 mg via INTRAVENOUS

## 2018-04-18 MED ORDER — DEXAMETHASONE SODIUM PHOSPHATE 10 MG/ML IJ SOLN
INTRAMUSCULAR | Status: AC
  Filled 2018-04-18: qty 1

## 2018-04-18 MED ORDER — PROMETHAZINE HCL 25 MG/ML IJ SOLN: 6.2500 mg | INTRAMUSCULAR | Status: DC | PRN

## 2018-04-18 MED ORDER — ONDANSETRON HCL 4 MG/2ML IJ SOLN
INTRAMUSCULAR | Status: AC
  Filled 2018-04-18: qty 2

## 2018-04-18 MED ORDER — SODIUM CHLORIDE 0.9 % IR SOLN
Status: DC | PRN
  Administered 2018-04-18: 1 mL

## 2018-04-18 MED ORDER — SODIUM CHLORIDE 0.9 % IV SOLN
INTRAVENOUS | Status: DC | PRN
  Administered 2018-04-18: 25 ug/min via INTRAVENOUS

## 2018-04-18 MED ORDER — PROPOFOL 10 MG/ML IV BOLUS
INTRAVENOUS | Status: DC | PRN
  Administered 2018-04-18: 130 mg via INTRAVENOUS

## 2018-04-18 MED ORDER — LACTATED RINGERS IV SOLN
INTRAVENOUS | Status: DC | PRN
  Administered 2018-04-18 (×2): via INTRAVENOUS

## 2018-04-18 MED ORDER — LIDOCAINE 2% (20 MG/ML) 5 ML SYRINGE
INTRAMUSCULAR | Status: DC | PRN
  Administered 2018-04-18: 60 mg via INTRAVENOUS
  Administered 2018-04-18: 40 mg via INTRAVENOUS

## 2018-04-18 MED ORDER — EPHEDRINE 5 MG/ML INJ
INTRAVENOUS | Status: AC
  Filled 2018-04-18: qty 10

## 2018-04-18 MED ORDER — 0.9 % SODIUM CHLORIDE (POUR BTL) OPTIME
TOPICAL | Status: DC | PRN
  Administered 2018-04-18: 1000 mL

## 2018-04-18 MED ORDER — PHENYLEPHRINE 40 MCG/ML (10ML) SYRINGE FOR IV PUSH (FOR BLOOD PRESSURE SUPPORT)
PREFILLED_SYRINGE | INTRAVENOUS | Status: AC
  Filled 2018-04-18: qty 10

## 2018-04-18 MED ORDER — DEXAMETHASONE SODIUM PHOSPHATE 10 MG/ML IJ SOLN
INTRAMUSCULAR | Status: DC | PRN
  Administered 2018-04-18: 10 mg via INTRAVENOUS

## 2018-04-18 MED ORDER — FENTANYL CITRATE (PF) 100 MCG/2ML IJ SOLN
INTRAMUSCULAR | Status: DC | PRN
  Administered 2018-04-18: 25 ug via INTRAVENOUS
  Administered 2018-04-18: 100 ug via INTRAVENOUS
  Administered 2018-04-18: 25 ug via INTRAVENOUS

## 2018-04-18 MED ORDER — MIDAZOLAM HCL 5 MG/5ML IJ SOLN
INTRAMUSCULAR | Status: DC | PRN
  Administered 2018-04-18 (×2): 1 mg via INTRAVENOUS

## 2018-04-18 MED ORDER — ONDANSETRON HCL 4 MG/2ML IJ SOLN
INTRAMUSCULAR | Status: DC | PRN
  Administered 2018-04-18: 4 mg via INTRAVENOUS

## 2018-04-18 MED ORDER — SUCCINYLCHOLINE CHLORIDE 200 MG/10ML IV SOSY
PREFILLED_SYRINGE | INTRAVENOUS | Status: AC
  Filled 2018-04-18: qty 10

## 2018-04-18 MED ORDER — FENTANYL CITRATE (PF) 250 MCG/5ML IJ SOLN
INTRAMUSCULAR | Status: AC
  Filled 2018-04-18: qty 5

## 2018-04-18 MED ORDER — SUGAMMADEX SODIUM 200 MG/2ML IV SOLN
INTRAVENOUS | Status: DC | PRN
  Administered 2018-04-18: 200 mg via INTRAVENOUS

## 2018-04-18 MED ORDER — FENTANYL CITRATE (PF) 100 MCG/2ML IJ SOLN: 25.0000 ug | INTRAMUSCULAR | Status: DC | PRN

## 2018-04-18 SURGICAL SUPPLY — 36 items
ADAPTER VALVE BIOPSY EBUS (MISCELLANEOUS) IMPLANT
ADPTR VALVE BIOPSY EBUS (MISCELLANEOUS)
BRUSH CYTOL CELLEBRITY 1.5X140 (MISCELLANEOUS) IMPLANT
BRUSH CYTOLOGY FOR PULMONARY 2 (MISCELLANEOUS) ×2 IMPLANT
CANISTER SUCT 3000ML PPV (MISCELLANEOUS) ×2 IMPLANT
CONT SPEC 4OZ CLIKSEAL STRL BL (MISCELLANEOUS) ×4 IMPLANT
COVER BACK TABLE 60X90IN (DRAPES) ×2 IMPLANT
COVER DOME SNAP 22 D (MISCELLANEOUS) ×2 IMPLANT
COVER WAND RF STERILE (DRAPES) IMPLANT
FORCEPS BIOP RJ4 1.8 (CUTTING FORCEPS) IMPLANT
FORCEPS RADIAL JAW LRG 4 PULM (INSTRUMENTS) ×1 IMPLANT
GAUZE SPONGE 4X4 12PLY STRL (GAUZE/BANDAGES/DRESSINGS) ×2 IMPLANT
GLOVE BIO SURGEON STRL SZ 6.5 (GLOVE) ×2 IMPLANT
KIT CLEAN ENDO COMPLIANCE (KITS) ×6 IMPLANT
KIT TURNOVER KIT B (KITS) ×2 IMPLANT
MARKER SKIN DUAL TIP RULER LAB (MISCELLANEOUS) ×2 IMPLANT
NEEDLE ASPIRATION VIZISHOT 19G (NEEDLE) ×2 IMPLANT
NEEDLE ASPIRATION VIZISHOT 21G (NEEDLE) ×2 IMPLANT
NEEDLE FILTER BLUNT 18X 1/2SAF (NEEDLE) ×1
NEEDLE FILTER BLUNT 18X1 1/2 (NEEDLE) ×1 IMPLANT
NEEDLE HYPO 25GX1X1/2 BEV (NEEDLE) ×2 IMPLANT
NS IRRIG 1000ML POUR BTL (IV SOLUTION) ×2 IMPLANT
OIL SILICONE PENTAX (PARTS (SERVICE/REPAIRS)) ×2 IMPLANT
PAD ARMBOARD 7.5X6 YLW CONV (MISCELLANEOUS) ×4 IMPLANT
RADIAL JAW LRG 4 PULMONARY (INSTRUMENTS) ×1
SYR 20CC LL (SYRINGE) ×2 IMPLANT
SYR 20ML ECCENTRIC (SYRINGE) ×2 IMPLANT
SYR 3ML LL SCALE MARK (SYRINGE) ×2 IMPLANT
TOWEL GREEN STERILE (TOWEL DISPOSABLE) ×2 IMPLANT
TOWEL GREEN STERILE FF (TOWEL DISPOSABLE) ×2 IMPLANT
TRAP SPECIMEN MUCOUS 40CC (MISCELLANEOUS) ×2 IMPLANT
TUBE CONNECTING 20X1/4 (TUBING) ×2 IMPLANT
VALVE BIOPSY  SINGLE USE (MISCELLANEOUS) ×1
VALVE BIOPSY SINGLE USE (MISCELLANEOUS) ×1 IMPLANT
VALVE SUCTION BRONCHIO DISP (MISCELLANEOUS) ×2 IMPLANT
WATER STERILE IRR 1000ML POUR (IV SOLUTION) ×2 IMPLANT

## 2018-04-18 NOTE — Progress Notes (Signed)
Patient interviewed in the preop area. Patient able to confirm name, DOB, procedure, allergies, npo status and metal in body. Patient states that she has a 4/10 headache "from coughing". Patient husband at bedside.   Leatha Gilding, RN

## 2018-04-18 NOTE — H&P (Signed)
[address]       [place] [address]             [phone]                    Michelle King [place] Medical Record #[id number] Date of Birth: 06-Dec-1960  Referring: Marice Potter, MD Primary Care: Charlotte Sanes, MD Primary Cardiologist: Shirlee More, MD  Chief Complaint:   History of lung cancer 03/25/2015 OPERATIVE REPORT PREOPERATIVE DIAGNOSIS: Adenocarcinoma, right upper lobe, status post chemotherapy. POSTOPERATIVE DIAGNOSIS: Adenocarcinoma, right upper lobe, status post chemotherapy. SURGICAL PROCEDURE: 1. Video bronchoscopy. 2. Right video-assisted thoracoscopy with right upper lobectomy. 3. Lymph node dissection. 4. Placement of ON-Q. SURGEON: Lanelle Bal, M.D  Cancer Staging Lung cancer-right upper lobe Staging form: Lung, AJCC 7th Edition - Clinical: T2, N2, M0 - Signed by Grace Isaac, MD on 05/11/2015 - Pathologic stage from 03/29/2015: Stage IIA (yT1b, N1, cM0) - Signed by Grace Isaac, MD on 05/11/2015  History of Present Illness:    Michelle King 57 y.o. female is seen in the office  today for evaluation after recent CT scan of the chest.  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  he has been followed postoperatively with serial CT scans by Dr. Bobby Rumpf.  The patient notes that in January of this year she began having a dry nonproductive cough.  She denies any hemoptysis. She notes that the cough has become progressively worse over the past several months.      Current Activity/ Functional Status:  Patient is independent with mobility/ambulation, transfers, ADL's, IADL's.   Zubrod Score: At the time of surgery this patient's most appropriate activity status/level should be described as: []     0    Normal activity, no symptoms [x]     1    Restricted in physical strenuous  activity but ambulatory, able to do out light work []     2    Ambulatory and capable of self care, unable to do work activities, up and about               >50 % of waking hours                              []     3    Only limited self care, in bed greater than 50% of waking hours []     4    Completely disabled, no self care, confined to bed or chair []     5    Moribund   Past Medical History:  Diagnosis Date  . Angio-edema 09/05/2016  . Arthritis    spine  . CAD (coronary artery disease)   . Cancer ([place])    lung  . Chronic idiopathic urticaria 09/05/2016  . Complication of anesthesia   . COPD (chronic obstructive pulmonary disease) ([place])   . Coronary artery disease involving native coronary artery of native heart with angina pectoris [facility])    Overview:  Anterior MI 2010 with PCI and stent Her last stress test pharmacologic showed normal ejection fraction and no evidence of ischemia. She has a history of previous LAD infarction with severely depressed ejection fraction that normalized with medical therapy and revascularization percutaneously.  . Family history of colon cancer   . Hematochezia 04/14/2015  .  History of bilateral tubal ligation   . Hyperlipidemia   . Lung cancer-right upper lobe 11/15/2014  . Myocardial infarction ([place]) 2010  . Obstructive chronic bronchitis without exacerbation ([place]) 03/01/2015  . Pneumonia    hx  . PONV (postoperative nausea and vomiting)   . Preoperative cardiovascular examination 03/02/2015  . 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 Pure hypercholesterolemia 03/01/2015  . S/P hysterectomy   . S/P lobectomy of lung 03/25/2015  . Shortness of breath dyspnea     Past Surgical History:  Procedure Laterality Date  . APPENDECTOMY    . CARDIAC CATHETERIZATION     stents  . COLONOSCOPY N/A 04/18/2015   Procedure: COLONOSCOPY;  Surgeon: Ladene Artist, MD;  Location: [facility] ENDOSCOPY;  Service: Endoscopy;  Laterality: N/A;  . MOUTH SURGERY     teeth  . TUBAL LIGATION     . VIDEO ASSISTED THORACOSCOPY (VATS)/WEDGE RESECTION Right 03/25/2015   Procedure: VIDEO ASSISTED THORACOSCOPY (VATS)/LUNG RESECTION;  Surgeon: Grace Isaac, MD;  Location: [place];  Service: Thoracic;  Laterality: Right;  Marland Kitchen VIDEO BRONCHOSCOPY N/A 03/25/2015   Procedure: VIDEO BRONCHOSCOPY;  Surgeon: Grace Isaac, MD;  Location: [facility] OR;  Service: Thoracic;  Laterality: N/A;  . VIDEO BRONCHOSCOPY WITH INSERTION OF INTERBRONCHIAL VALVE (IBV) N/A 04/12/2015   Procedure: VIDEO BRONCHOSCOPY WITH INSERTION OF 7MM INTERBRONCHIAL VALVES (IBV) IN SUPERIOR SEGMENT, MEDIAL AND POSTERIOR SEGMENT, AND LATERAL SEGMENT OF RIGHT LOWER LOBE;  Surgeon: Grace Isaac, MD;  [facility];  Service: Thoracic;  Laterality: N/A;  10mm Spiration Valve placed in Superior Segment, Medial and Posterior Segment, and Lateral Segment of Right Lower Lobe  . VIDEO BRONCHOSCOPY WITH INSERTION OF INTERBRONCHIAL VALVE (IBV) Right 04/19/2015   Procedure: VIDEO BRONCHOSCOPY WITH INSERTION OF INTERBRONCHIAL VALVE (IBV);  Surgeon: Grace Isaac, MD;  Location: [place];  Service: Thoracic;  Laterality: Right;  Marland Kitchen VIDEO BRONCHOSCOPY WITH INSERTION OF INTERBRONCHIAL VALVE (IBV) N/A 06/03/2015   Procedure: VIDEO BRONCHOSCOPY WITH REMOVAL OF INTERBRONCHIAL VALVE (IBV);  Surgeon: Grace Isaac, MD;  Location: [facility] OR;  Service: Thoracic;  Laterality: N/A;    Family History  Problem Relation Age of Onset  . Diabetes Mother   . Cancer Father        colon  . Stroke Sister   . Cancer Brother        colon cancer  . Cancer Sister        lung cancer  . Heart disease Brother   . Cerebrovascular Disease Brother   . Heart disease Brother   . Cerebrovascular Disease Brother   . Stroke Sister      Social History   Tobacco Use  Smoking Status Former Smoker  . Packs/day: 0.50  . Years: 40.00  . Pack years: 20.00  . Types: Cigarettes  . Last attempt to quit: 03/25/2015  . Years since quitting: 3.0  Smokeless Tobacco Never  Used  Tobacco Comment   down to 4 aday    Social History   Substance and Sexual Activity  Alcohol Use No  . Alcohol/week: 0.0 standard drinks     Allergies  Allergen Reactions  . Codeine Itching and Swelling    Facial swelling Tolerates norco  . Rosuvastatin Other (See Comments)    Nose bleeds  . Sympathomimetics Other (See Comments)    UNSPECIFIED REACTION  "Pt was not told what the reaction was"    No current facility-administered medications for this encounter.       Review of Systems:  Cardiac Review of Systems: [Y] = yes  or   [ N ] = no   Chest Pain [ n   ]  Resting SOB [  n ] Exertional SOB  [ y ]  Vertell Limber Florencio.Farrier  ]   Pedal Edema [ n  ]    Palpitations [ n ] Syncope  [ n ]   Presyncope [n   ]   General Review of Systems: [Y] = yes [  ]=no Constitional: recent weight change [  ];  Wt loss over the last 3 months [   ] anorexia [  ]; fatigue [  ]; nausea [  ]; night sweats [  ]; fever [  ]; or chills [  ];           Eye : blurred vision [  ]; diplopia [   ]; vision changes [  ];  Amaurosis fugax[  ]; Resp: cough [  y];  wheezing[ y ];  hemoptysis[  ]; shortness of breath[y  ]; paroxysmal nocturnal dyspnea[  ]; dyspnea on exertion[  ]; or orthopnea[  ];  GI:  gallstones[  ], vomiting[  ];  dysphagia[  ]; melena[  ];  hematochezia [  ]; heartburn[  ];   Hx of  Colonoscopy[  ]; GU: kidney stones [  ]; hematuria[  ];   dysuria [  ];  nocturia[  ];  history of     obstruction [  ]; urinary frequency [  ]             Skin: rash, swelling[  ];, hair loss[  ];  peripheral edema[  ];  or itching[  ]; Musculosketetal: myalgias[  ];  joint swelling[  ];  joint erythema[  ];  joint pain[  ];  back pain[  ];  Heme/Lymph: bruising[  ];  bleeding[  ];  anemia[  ];  Neuro: TIA[  ];  headaches[  ];  stroke[  ];  vertigo[  ];  seizures[  ];   paresthesias[  ];  difficulty walking[  ];  Psych:depression[  ]; anxiety[ y ];  Endocrine: diabetes[  ];  thyroid dysfunction[   ];  Immunizations: Flu up to date Blue.Reese  ]; Pneumococcal up to date Blue.Reese  ];  Other:    PHYSICAL EXAMINATION: BP 130/78   Pulse 90   Temp 98.1 F (36.7 C) (Oral)   Resp 20   Ht 5\' 2"  (1.575 m)   Wt 65 kg   SpO2 97%   BMI 26.19 kg/m  General appearance: alert, cooperative and no distress Head: Normocephalic, without obvious abnormality, atraumatic Neck: no adenopathy, no carotid bruit, no JVD, supple, symmetrical, trachea midline and thyroid not enlarged, symmetric, no tenderness/mass/nodules Lymph nodes: Cervical, supraclavicular, and axillary nodes normal. Resp: clear to auscultation bilaterally Back: symmetric, no curvature. ROM normal. No CVA tenderness. Cardio: regular rate and rhythm, S1, S2 normal, no murmur, click, rub or gallop GI: soft, non-tender; bowel sounds normal; no masses,  no organomegaly Extremities: extremities normal, atraumatic, no cyanosis or edema and Homans sign is negative, no sign of DVT Neurologic: Grossly normal in spite of PET findings do not feel node in right neck  Diagnostic Studies & Laboratory data:     Recent Radiology Findings:   CLINICAL DATA: Right lung cancer, chemotherapy complete.  EXAM: CT CHEST WITH CONTRAST  TECHNIQUE: Multidetector CT imaging of the chest was performed during intravenous contrast administration.  CONTRAST: 60 cc Isovue 370.  COMPARISON: 04/04/2017.  FINDINGS: Cardiovascular: Atherosclerotic calcification of the arterial vasculature, including coronary arteries. Heart is mildly enlarged with left ventricular dilatation. No pericardial effusion.  Mediastinum/Nodes: Low right paratracheal adenopathy measures approximately 2.1 x 3.1 cm, increased from 9 mm in short axis. AP window lymph node measures 10 mm, previously 6 mm. Hilar lymph nodes are not enlarged by CT size criteria. No axillary adenopathy. Esophagus is grossly unremarkable.  Lungs/Pleura: Centrilobular emphysema. Right upper lobectomy.  Volume loss in the right middle lobe. No pleural fluid. Airway is otherwise unremarkable.  Upper Abdomen: Visualized portions of the liver, gallbladder, adrenal glands, kidneys, spleen, pancreas, stomach and bowel are grossly unremarkable. Favor focal fat along the gallbladder fossa. No upper abdominal adenopathy.  Musculoskeletal: No worrisome lytic or sclerotic lesions.  IMPRESSION: 1. New mediastinal adenopathy, indicative of disease recurrence. 2. Aortic atherosclerosis (ICD10-170.0). Coronary artery calcification.   Electronically Signed By: Lorin Picket M.D. On: 04/07/2018 14:29  PET scan done yesterday not read yet - my review confirms hypermetabolic nodes in mediastinum  I have independently reviewed the above radiology studies  and reviewed the findings with the patient.   Recent Lab Findings: Lab Results  Component Value Date   WBC 6.7 04/17/2018   HGB 14.7 04/17/2018   HCT 46.3 (H) 04/17/2018   PLT 337 04/17/2018   GLUCOSE 102 (H) 04/17/2018   CHOL 115 04/16/2018   TRIG 117 04/16/2018   HDL 43 04/16/2018   LDLCALC 49 04/16/2018   ALT 19 04/17/2018   AST 19 04/17/2018   NA 138 04/17/2018   K 4.0 04/17/2018   CL 104 04/17/2018   CREATININE 0.57 04/17/2018   BUN 11 04/17/2018   CO2 26 04/17/2018   TSH 1.740 06/14/2016   INR 0.95 04/17/2018      Assessment / Plan:   Patient is status post presentation with a clinical stage IIIa adenocarcinoma of the right upper lobe and 2016.  She was treated with a course of chemotherapy preoperatively and underwent right upper lobectomy with node dissection, pathologic stage IIa.  She has been followed since that time with serial CT scans.  Most recent CT scan indicates possible mediastinal recurrence with enlarging lymph nodes. Confirmed on PET.   In addition she has had significant cough over the past several months.  I reviewed her films with her and recommend with  Biopsy with   bronchoscopy and endobronchial  ultrasound and trans-bronchial biopsy of mediastinal nodes.  She is agreeable with this approach  The current treatment plan to obtain tissue diagnosis was discussed with the patient and her husband in detail, there questions have been answered.    The goals risks and alternatives of the planned surgical procedure Procedure(s): VIDEO BRONCHOSCOPY WITH ENDOBRONCHIAL ULTRASOUND (N/A)  have been discussed with the patient in detail. The risks of the procedure including death, infection, stroke, myocardial infarction, bleeding, blood transfusion have all been discussed specifically.  I have quoted Michelle King a 1 % of perioperative mortality and a complication rate as high as 10 %. The patient's questions have been answered.Michelle King is willing  to proceed with the planned procedure.  Grace Isaac MD      [address] [place] [address] Office [phone]   Beeper [phone]  04/18/2018 7:06 AM

## 2018-04-18 NOTE — Anesthesia Postprocedure Evaluation (Signed)
Anesthesia Post Note  Patient: Michelle King  Procedure(s) Performed: VIDEO BRONCHOSCOPY WITH ENDOBRONCHIAL ULTRASOUND (N/A )     Patient location during evaluation: PACU Anesthesia Type: General Level of consciousness: sedated and patient cooperative Pain management: pain level controlled Vital Signs Assessment: post-procedure vital signs reviewed and stable Respiratory status: spontaneous breathing Cardiovascular status: stable Anesthetic complications: no    Last Vitals:  Vitals:   04/18/18 0940 04/18/18 1000  BP: 97/62 93/62  Pulse: (!) 105 (!) 101  Resp: 16 (!) 23  Temp: 36.6 C   SpO2: 96% 96%    Last Pain:  Vitals:   04/18/18 0940  TempSrc:   PainSc: 0-No pain                 Nolon Nations

## 2018-04-18 NOTE — Brief Op Note (Signed)
04/18/2018  9:32 AM  PATIENT:  Michelle King  56 y.o. female  PRE-OPERATIVE DIAGNOSIS:  mediastinal adenopathy s/p right upper lobectomy  POST-OPERATIVE DIAGNOSIS:  mediastinal adenopathy- positive brushings   PROCEDURE:  Procedure(s): VIDEO BRONCHOSCOPY WITH ENDOBRONCHIAL ULTRASOUND (N/A)  SURGEON:  Surgeon(s) and Role:    * Grace Isaac, MD - Primary   ANESTHESIA:   general  EBL:  None   BLOOD ADMINISTERED:none  DRAINS: none   LOCAL MEDICATIONS USED:  NONE  SPECIMEN:  Source of Specimen:  right main stem bronchus  4r nodes   DISPOSITION OF SPECIMEN:  PATHOLOGY  COUNTS:  YES  TOURNIQUET:  * No tourniquets in log *  DICTATION: .Dragon Dictation  PLAN OF CARE: Discharge to home after PACU  PATIENT DISPOSITION:  PACU - hemodynamically stable.   Delay start of Pharmacological VTE agent (>24hrs) due to surgical blood loss or risk of bleeding: yes  Photos in [employer]

## 2018-04-18 NOTE — Transfer of Care (Signed)
Immediate Anesthesia Transfer of Care Note  Patient: Michelle King  Procedure(s) Performed: VIDEO BRONCHOSCOPY WITH ENDOBRONCHIAL ULTRASOUND (N/A )  Patient Location: PACU  Anesthesia Type:General  Level of Consciousness: awake, alert  and oriented  Airway & Oxygen Therapy: Patient Spontanous Breathing and Patient connected to face mask oxygen  Post-op Assessment: Report given to RN, Post -op Vital signs reviewed and stable and Patient moving all extremities X 4  Post vital signs: Reviewed and stable  Last Vitals:  Vitals Value Taken Time  BP 97/62 04/18/2018  9:41 AM  Temp    Pulse 100 04/18/2018  9:45 AM  Resp    SpO2 96 % 04/18/2018  9:45 AM  Vitals shown include unvalidated device data.  Last Pain:  Vitals:   04/18/18 0556  TempSrc:   PainSc: 4          Complications: No apparent anesthesia complications

## 2018-04-18 NOTE — Discharge Instructions (Signed)
Flexible Bronchoscopy, Care After °This sheet gives you information about how to care for yourself after your procedure. Your health care provider may also give you more specific instructions. If you have problems or questions, contact your health care provider. °What can I expect after the procedure? °After the procedure, it is common to have the following symptoms for 24-48 hours: °· A cough that is worse than it was before the procedure. °· A low-grade fever. °· A sore throat or hoarse voice. °· Small streaks of blood in the mucus from your lungs (sputum), if tissue samples were removed (biopsy). ° °Follow these instructions at home: °Eating and drinking °· Do not eat or drink anything (including water) for 2 hours after your procedure, or until your numbing medicine (local anesthetic) has worn off. Having a numb throat increases your risk of burning yourself or choking. °· After your numbness is gone and your cough and gag reflexes have returned, you may start eating only soft foods and slowly drinking liquids. °· The day after the procedure, return to your normal diet. °Driving °· Do not drive for 24 hours if you were given a medicine to help you relax (sedative). °· Do not drive or use heavy machinery while taking prescription pain medicine. °General instructions °· Take over-the-counter and prescription medicines only as told by your health care provider. °· Return to your normal activities as told by your health care provider. Ask your health care provider what activities are safe for you. °· Do not use any products that contain nicotine or tobacco, such as cigarettes and e-cigarettes. If you need help quitting, ask your health care provider. °· Keep all follow-up visits as told by your health care provider. This is important, especially if you had a biopsy taken. °Get help right away if: °· You have shortness of breath that gets worse. °· You become light-headed or feel like you might faint. °· You have  chest pain. °· You cough up more than a small amount of blood. °· The amount of blood you cough up increases. °Summary °· Common symptoms in the 24-48 hours following a flexible bronchoscopy include cough, low-grade fever, sore throat or hoarse voice, and blood-streaked mucus from the lungs (if you had a biopsy). °· Do not eat or drink anything (including water) for 2 hours after your procedure, or until your local anesthetic has worn off. You can return to your normal diet the day after the procedure. °· Get help right away if you develop worsening shortness of breath, have chest pain, become light-headed, or cough up more than a small amount of blood. °This information is not intended to replace advice given to you by your health care provider. Make sure you discuss any questions you have with your health care provider. °Document Released: 01/19/2005 Document Revised: 07/20/2016 Document Reviewed: 07/20/2016 °Elsevier Interactive Patient Education © 2017 [employer] ° °

## 2018-04-18 NOTE — Anesthesia Procedure Notes (Signed)
Procedure Name: Intubation Date/Time: 04/18/2018 7:40 AM Performed by: Kyung Rudd, CRNA Pre-anesthesia Checklist: Patient identified, Emergency Drugs available, Suction available and Patient being monitored Patient Re-evaluated:Patient Re-evaluated prior to induction Oxygen Delivery Method: Circle system utilized Preoxygenation: Pre-oxygenation with 100% oxygen Induction Type: IV induction Ventilation: Mask ventilation without difficulty and Oral airway inserted - appropriate to patient size Laryngoscope Size: Sabra Heck and 2 Grade View: Grade I Tube type: Oral Tube size: 8.5 mm Number of attempts: 1 Airway Equipment and Method: Stylet Placement Confirmation: ETT inserted through vocal cords under direct vision,  positive ETCO2 and breath sounds checked- equal and bilateral Secured at: 20 cm Tube secured with: Tape Dental Injury: Injury to lip  Comments: Intubation by Clifton Custard, SRNA on 1st attempt with Dr. Lissa Hoard supervising.

## 2018-04-19 ENCOUNTER — Encounter (HOSPITAL_COMMUNITY): Payer: Self-pay | Admitting: Cardiothoracic Surgery

## 2018-04-19 NOTE — Op Note (Signed)
NAME: Michelle King, Michelle King MEDICAL RECORD [id number] ACCOUNT [id number] DATE OF BIRTH:January 18, 1961 FACILITY: [facility] LOCATION: [facility] PHYSICIAN:Seniyah Esker Maryruth Bun, MD  OPERATIVE REPORT  DATE OF PROCEDURE:  04/18/2018  PREOPERATIVE DIAGNOSIS:  History of nonsmall-cell lung cancer status post resection, right upper lobe.  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  PROCEDURE PERFORMED:  Video bronchoscopy with brushings and biopsy of right main stem bronchus and endoscopic bronchial ultrasound and transbronchial biopsies.  SURGEON:  Lanelle Bal, MD  BRIEF HISTORY:  The patient is a 57 year old female who 2 years previously had undergone a right upper lobectomy and node dissection for nonsmall-cell carcinoma of the lung, preoperatively treated with a course of chemotherapy.  At the time of resection,  the patient had pathologic stage IIB disease.  She has been followed on a regular basis with serial CT scans.  It was noted increasing mediastinal adenopathy, and the patient was referred for consideration of repeat biopsy to evaluate for recurrent  disease.  CT scans were reviewed and concurred that the patient had developed progressive enlargement of right peritracheal and pretracheal lymphadenopathy.  Prior to biopsy, PET scan was performed.  There was no distant metastatic disease noted;  however, the patient did have hypermetabolic areas of nodal activity in the mediastinum and also right upper chest supraclavicular area, though on exam, this was not palpable.  Repeat bronchoscopy and biopsy were recommended to the patient who agreed and  signed informed consent.  DESCRIPTION OF PROCEDURE:  The patient underwent general endotracheal anesthesia with a single-lumen endotracheal tube without incident.  Appropriate timeout was performed, and then we proceeded with video bronchoscopy to the  subsegmental level on the  left and right tracheobronchial tree.  The left tracheobronchial tree was without abnormality.  The takeoff of the right upper lobe bronchus where it had been stapled appeared normal; however, at the origin of the right main stem bronchus anteriorly and  laterally, there were areas of easy friability and some external compression of the right main stem bronchus was widely patent.  Brushings were obtained from this area.  We then placed the endoscopic bronchoscope and easily identified large nodal areas  along the 4R area, and using both a 19 and a 20 needle, multiple passes were performed in this area, both with aspiration and wire pullback.  Multiple slides were made.  The first of these slides were a quick smear and were interpreted by pathology as  rare atypical cells.  We persisted in obtaining further tissue as this was inadequate for what we were finding on the scans.  Biopsy forceps were placed through the standard bronchoscope, and the area at the takeoff of the right mainstem bronchus was  biopsied and sent for frozen section.  With these biopsies, pathology still could not definitively make a diagnosis of malignancy.  We went back to the endoscopic ultrasound taking further biopsies with a 21-gauge needle along the 4R area.  Ultimately,  pathologic interpretation was consistent with some malignancy.  The remaining tissue that we had obtained was submitted for permanent section.  The tracheobronchial tree was suctioned of all secretions and was free of bleeding.  The scope was then  removed.  The patient was extubated in the operating room and returned to the recovery room in good condition, having tolerated the procedure without obvious complication.  LN/NUANCE  D:04/19/2018 T:04/19/2018 JOB:[id number]

## 2018-04-23 ENCOUNTER — Telehealth: Payer: Self-pay | Admitting: Cardiology

## 2018-04-23 NOTE — Telephone Encounter (Signed)
Needs a Cortizone injection for pinched nerve but was told that Cortizone and Plavix "don't mix"

## 2018-04-23 NOTE — Telephone Encounter (Signed)
Informed Merry Proud that the patient would have to wait until this can be reviewed next week when [facility] returns from vacation. They were okay with this and will expect a call next week.

## 2018-04-24 DIAGNOSIS — C3491 Malignant neoplasm of unspecified part of right bronchus or lung: Secondary | ICD-10-CM | POA: Diagnosis not present

## 2018-04-24 DIAGNOSIS — Z9221 Personal history of antineoplastic chemotherapy: Secondary | ICD-10-CM | POA: Diagnosis not present

## 2018-04-24 DIAGNOSIS — C770 Secondary and unspecified malignant neoplasm of lymph nodes of head, face and neck: Secondary | ICD-10-CM | POA: Diagnosis not present

## 2018-04-24 DIAGNOSIS — Z85118 Personal history of other malignant neoplasm of bronchus and lung: Secondary | ICD-10-CM

## 2018-04-24 DIAGNOSIS — Z902 Acquired absence of lung [part of]: Secondary | ICD-10-CM

## 2018-04-28 NOTE — Telephone Encounter (Signed)
Left detailed message on patients voicemail that Dr Bettina Gavia is unaware of any concerns between Plavix and Cortizone.  Advised that patient should speak with pain specialist with any other questions or concerns.

## 2018-04-28 NOTE — Telephone Encounter (Signed)
I am unaware of any concerns, ask the pain specialist

## 2018-04-30 DIAGNOSIS — Z9221 Personal history of antineoplastic chemotherapy: Secondary | ICD-10-CM

## 2018-04-30 DIAGNOSIS — Z902 Acquired absence of lung [part of]: Secondary | ICD-10-CM

## 2018-04-30 DIAGNOSIS — Z85118 Personal history of other malignant neoplasm of bronchus and lung: Secondary | ICD-10-CM | POA: Diagnosis not present

## 2018-04-30 DIAGNOSIS — C3491 Malignant neoplasm of unspecified part of right bronchus or lung: Secondary | ICD-10-CM

## 2018-04-30 DIAGNOSIS — C770 Secondary and unspecified malignant neoplasm of lymph nodes of head, face and neck: Secondary | ICD-10-CM | POA: Diagnosis not present

## 2018-05-21 DIAGNOSIS — Z09 Encounter for follow-up examination after completed treatment for conditions other than malignant neoplasm: Secondary | ICD-10-CM

## 2018-05-21 HISTORY — DX: Encounter for follow-up examination after completed treatment for conditions other than malignant neoplasm: Z09

## 2018-06-04 DIAGNOSIS — Z902 Acquired absence of lung [part of]: Secondary | ICD-10-CM

## 2018-06-04 DIAGNOSIS — C3491 Malignant neoplasm of unspecified part of right bronchus or lung: Secondary | ICD-10-CM

## 2018-06-04 DIAGNOSIS — C778 Secondary and unspecified malignant neoplasm of lymph nodes of multiple regions: Secondary | ICD-10-CM | POA: Diagnosis not present

## 2018-06-04 DIAGNOSIS — Z9221 Personal history of antineoplastic chemotherapy: Secondary | ICD-10-CM

## 2018-06-04 DIAGNOSIS — Z85118 Personal history of other malignant neoplasm of bronchus and lung: Secondary | ICD-10-CM | POA: Diagnosis not present

## 2018-06-17 ENCOUNTER — Other Ambulatory Visit: Payer: Self-pay | Admitting: Emergency Medicine

## 2018-06-17 MED ORDER — EVOLOCUMAB 140 MG/ML ~~LOC~~ SOAJ: 140.0000 mg | SUBCUTANEOUS | 1 refills | Status: DC

## 2018-06-18 DIAGNOSIS — Z902 Acquired absence of lung [part of]: Secondary | ICD-10-CM

## 2018-06-18 DIAGNOSIS — R05 Cough: Secondary | ICD-10-CM

## 2018-06-18 DIAGNOSIS — C3491 Malignant neoplasm of unspecified part of right bronchus or lung: Secondary | ICD-10-CM

## 2018-06-18 DIAGNOSIS — Z9221 Personal history of antineoplastic chemotherapy: Secondary | ICD-10-CM

## 2018-06-18 DIAGNOSIS — Z85118 Personal history of other malignant neoplasm of bronchus and lung: Secondary | ICD-10-CM | POA: Diagnosis not present

## 2018-06-18 DIAGNOSIS — C778 Secondary and unspecified malignant neoplasm of lymph nodes of multiple regions: Secondary | ICD-10-CM | POA: Diagnosis not present

## 2018-06-26 ENCOUNTER — Telehealth: Payer: Self-pay

## 2018-06-26 MED ORDER — EVOLOCUMAB 140 MG/ML ~~LOC~~ SOAJ: 140.0000 mg | SUBCUTANEOUS | 5 refills | Status: DC

## 2018-06-26 NOTE — Telephone Encounter (Signed)
Per patient [facility] will no longer be delivering her medications to her home.  Patient would prefer Repatha sent to [facility] in [place].  Patient advised to contact our office with any further questions or concerns.

## 2018-07-30 ENCOUNTER — Telehealth: Payer: Self-pay

## 2018-07-30 DIAGNOSIS — C781 Secondary malignant neoplasm of mediastinum: Secondary | ICD-10-CM

## 2018-07-30 DIAGNOSIS — R51 Headache: Secondary | ICD-10-CM

## 2018-07-30 DIAGNOSIS — Z923 Personal history of irradiation: Secondary | ICD-10-CM

## 2018-07-30 DIAGNOSIS — C770 Secondary and unspecified malignant neoplasm of lymph nodes of head, face and neck: Secondary | ICD-10-CM | POA: Diagnosis not present

## 2018-07-30 DIAGNOSIS — R05 Cough: Secondary | ICD-10-CM

## 2018-07-30 DIAGNOSIS — G893 Neoplasm related pain (acute) (chronic): Secondary | ICD-10-CM

## 2018-07-30 DIAGNOSIS — Z9221 Personal history of antineoplastic chemotherapy: Secondary | ICD-10-CM | POA: Diagnosis not present

## 2018-07-30 DIAGNOSIS — C3491 Malignant neoplasm of unspecified part of right bronchus or lung: Secondary | ICD-10-CM

## 2018-07-30 NOTE — Telephone Encounter (Signed)
Entered in error

## 2018-07-30 NOTE — Telephone Encounter (Signed)
Left voicemail message requesting for patient to call our office with updated insurance information. Per [email or web address], patient is not currently enrolled with [employer] Part D. Unable to proceed with Repatha PA until this information has been received.

## 2018-07-30 NOTE — Telephone Encounter (Signed)
Patient returned call saying their card says [employer], with an ID of [id number], group # [phone]

## 2018-08-07 DIAGNOSIS — C778 Secondary and unspecified malignant neoplasm of lymph nodes of multiple regions: Secondary | ICD-10-CM | POA: Diagnosis not present

## 2018-08-07 DIAGNOSIS — C3491 Malignant neoplasm of unspecified part of right bronchus or lung: Secondary | ICD-10-CM | POA: Diagnosis not present

## 2018-08-14 ENCOUNTER — Telehealth: Payer: Self-pay | Admitting: *Deleted

## 2018-08-14 NOTE — Telephone Encounter (Signed)
Informed patient that Michelle King has approved her repatha through 07/16/2019. Advised patient to pick up medication as she normally does. Patient is due for her next dose on Wednesday, 08/20/2018. Patient denies needing this medication refilled at this time. No further questions.

## 2018-08-26 ENCOUNTER — Encounter (HOSPITAL_COMMUNITY): Payer: Self-pay | Admitting: Oncology

## 2018-09-05 ENCOUNTER — Other Ambulatory Visit (HOSPITAL_COMMUNITY)
Admission: RE | Admit: 2018-09-05 | Discharge: 2018-09-05 | Disposition: A | Discharge status: Home / Self Care | Payer: Medicare HMO | Source: Ambulatory Visit | Attending: Oncology | Admitting: Oncology

## 2018-09-05 DIAGNOSIS — C3490 Malignant neoplasm of unspecified part of unspecified bronchus or lung: Secondary | ICD-10-CM | POA: Diagnosis present

## 2018-09-05 DIAGNOSIS — C781 Secondary malignant neoplasm of mediastinum: Secondary | ICD-10-CM

## 2018-09-05 DIAGNOSIS — Z9221 Personal history of antineoplastic chemotherapy: Secondary | ICD-10-CM

## 2018-09-05 DIAGNOSIS — C770 Secondary and unspecified malignant neoplasm of lymph nodes of head, face and neck: Secondary | ICD-10-CM | POA: Diagnosis not present

## 2018-09-05 DIAGNOSIS — C3491 Malignant neoplasm of unspecified part of right bronchus or lung: Secondary | ICD-10-CM

## 2018-09-05 DIAGNOSIS — Z923 Personal history of irradiation: Secondary | ICD-10-CM

## 2018-09-15 ENCOUNTER — Encounter (HOSPITAL_COMMUNITY): Payer: Self-pay | Admitting: Oncology

## 2018-10-10 DIAGNOSIS — C781 Secondary malignant neoplasm of mediastinum: Secondary | ICD-10-CM | POA: Diagnosis not present

## 2018-10-10 DIAGNOSIS — C770 Secondary and unspecified malignant neoplasm of lymph nodes of head, face and neck: Secondary | ICD-10-CM

## 2018-10-10 DIAGNOSIS — C3491 Malignant neoplasm of unspecified part of right bronchus or lung: Secondary | ICD-10-CM

## 2018-10-29 ENCOUNTER — Other Ambulatory Visit: Payer: Self-pay | Admitting: Cardiology

## 2018-10-29 MED ORDER — EVOLOCUMAB 140 MG/ML ~~LOC~~ SOAJ: 140.0000 mg | SUBCUTANEOUS | 5 refills | Status: DC

## 2018-10-29 NOTE — Telephone Encounter (Signed)
°*  STAT* If patient is at the pharmacy, call can be transferred to refill team.   1. Which medications need to be refilled? (please list name of each medication and dose if known) Repatha injections  2. Which pharmacy/location (including street and city if local pharmacy) is medication to be sent to?[facility] in [place]  3. Do they need a 30 day or 90 day supply? [place]

## 2018-10-29 NOTE — Telephone Encounter (Signed)
Sent Repatha into [facility] in [place]

## 2018-11-07 DIAGNOSIS — C770 Secondary and unspecified malignant neoplasm of lymph nodes of head, face and neck: Secondary | ICD-10-CM

## 2018-11-07 DIAGNOSIS — C781 Secondary malignant neoplasm of mediastinum: Secondary | ICD-10-CM | POA: Diagnosis not present

## 2018-11-07 DIAGNOSIS — C3491 Malignant neoplasm of unspecified part of right bronchus or lung: Secondary | ICD-10-CM | POA: Diagnosis not present

## 2018-12-05 DIAGNOSIS — C781 Secondary malignant neoplasm of mediastinum: Secondary | ICD-10-CM

## 2018-12-05 DIAGNOSIS — C770 Secondary and unspecified malignant neoplasm of lymph nodes of head, face and neck: Secondary | ICD-10-CM

## 2018-12-05 DIAGNOSIS — C3491 Malignant neoplasm of unspecified part of right bronchus or lung: Secondary | ICD-10-CM | POA: Diagnosis not present

## 2019-01-12 DIAGNOSIS — C770 Secondary and unspecified malignant neoplasm of lymph nodes of head, face and neck: Secondary | ICD-10-CM

## 2019-01-12 DIAGNOSIS — C3491 Malignant neoplasm of unspecified part of right bronchus or lung: Secondary | ICD-10-CM

## 2019-01-12 DIAGNOSIS — C781 Secondary malignant neoplasm of mediastinum: Secondary | ICD-10-CM

## 2019-01-26 DIAGNOSIS — C3411 Malignant neoplasm of upper lobe, right bronchus or lung: Secondary | ICD-10-CM | POA: Diagnosis not present

## 2019-03-02 DIAGNOSIS — C3411 Malignant neoplasm of upper lobe, right bronchus or lung: Secondary | ICD-10-CM

## 2019-03-16 ENCOUNTER — Telehealth: Payer: Self-pay | Admitting: Cardiology

## 2019-03-16 NOTE — Telephone Encounter (Signed)
Patient advised of Dr Joya Gaskins recommendation that Repatha has no effect on bleeding and she should continue this medication.  Patient was advised to stop aspirin to see if nose bleeds will resolve.  Patient advised if nose bleeds continue, she should contact ear, nose, and throat for further evaluation and possible cauterization.  Patient agreed to plan and verbalized understanding.  No further questions.

## 2019-03-16 NOTE — Telephone Encounter (Signed)
Her medications is causing nose bleeds

## 2019-03-16 NOTE — Telephone Encounter (Signed)
Patient states that for 2 - 3 months she has been having nose bleeds 1-2 days after giving herself Repatha injection.  She states "nose bleeds off and on for about 1 full day."  She does not have any issues with nose bleeds any other time.  She would like to know if Repatha could be causing this.  Please advise.

## 2019-03-16 NOTE — Telephone Encounter (Signed)
Repatha has no effect on bleeding  She is taken aspirin and clopidogrel lets drop the aspirin out that should help if she is not improved she should go see ear nose and throat because oftentimes they will be a excoriated vein and they can cauterize it in the's.

## 2019-03-16 NOTE — Addendum Note (Signed)
Addended by: Stevan Born on: 03/16/2019 03:30 PM   Modules accepted: Orders

## 2019-03-30 DIAGNOSIS — C3411 Malignant neoplasm of upper lobe, right bronchus or lung: Secondary | ICD-10-CM

## 2019-04-22 DIAGNOSIS — K811 Chronic cholecystitis: Secondary | ICD-10-CM

## 2019-04-22 HISTORY — DX: Chronic cholecystitis: K81.1

## 2019-04-27 DIAGNOSIS — C3411 Malignant neoplasm of upper lobe, right bronchus or lung: Secondary | ICD-10-CM

## 2019-05-04 ENCOUNTER — Ambulatory Visit: Payer: Medicare HMO | Admitting: Cardiology

## 2019-05-05 ENCOUNTER — Other Ambulatory Visit: Payer: Self-pay

## 2019-05-07 ENCOUNTER — Ambulatory Visit (INDEPENDENT_AMBULATORY_CARE_PROVIDER_SITE_OTHER): Payer: Medicare HMO | Admitting: Cardiology

## 2019-05-07 ENCOUNTER — Encounter: Payer: Self-pay | Admitting: Cardiology

## 2019-05-07 ENCOUNTER — Other Ambulatory Visit: Payer: Self-pay

## 2019-05-07 VITALS — BP 112/72 | HR 88 | Ht 62.0 in | Wt 135.0 lb

## 2019-05-07 DIAGNOSIS — I2510 Atherosclerotic heart disease of native coronary artery without angina pectoris: Secondary | ICD-10-CM | POA: Diagnosis not present

## 2019-05-07 DIAGNOSIS — Z01810 Encounter for preprocedural cardiovascular examination: Secondary | ICD-10-CM | POA: Diagnosis not present

## 2019-05-07 DIAGNOSIS — E785 Hyperlipidemia, unspecified: Secondary | ICD-10-CM

## 2019-05-07 NOTE — Addendum Note (Signed)
Addended by: Particia Nearing B on: 05/07/2019 10:49 AM   Modules accepted: Orders

## 2019-05-07 NOTE — Patient Instructions (Signed)
Medication Instructions:  Your physician recommends that you continue on your current medications as directed. Please refer to the Current Medication list given to you today.  *If you need a refill on your cardiac medications before your next appointment, please call your pharmacy*  Lab Work: None If you have labs (blood work) drawn today and your tests are completely normal, you will receive your results only by: Marland Kitchen MyChart Message (if you have MyChart) OR . A paper copy in the mail If you have any lab test that is abnormal or we need to change your treatment, we will call you to review the results.  Testing/Procedures: None  Follow-Up: At [facility], you and your health needs are our priority.  As part of our continuing mission to provide you with exceptional heart care, we have created designated Provider Care Teams.  These Care Teams include your primary Cardiologist (physician) and Advanced Practice Providers (APPs -  Physician Assistants and Nurse Practitioners) who all work together to provide you with the care you need, when you need it.  Your next appointment:   November  The format for your next appointment:   In Person  Provider:   Shirlee More, MD  Other Instructions

## 2019-05-07 NOTE — Progress Notes (Signed)
Cardiology Office Note:    Date:  05/07/2019   ID:  Michelle King, DOB 1960/12/04, MRN [id number]  PCP:  Charlotte Sanes, MD  Cardiologist:  Shirlee More, MD  Electrophysiologist:  None   Referring MD: Charlotte Sanes, MD   Preoperative clearance for planned cholecystectomy.  History of Present Illness:    Michelle King is a 58 y.o. female with a hx of history of STEMI status post PCI to the proximal LAD, coronary disease, dyslipidemia with statin intolerance (the patient has been started on a PCSK9 inhibitor).  The patient was last seen by Dr. Bettina Gavia on March 13, 2018.  During that time she was able from a cardiovascular standpoint.  But her iron panel needs to be checked due to her restless leg syndrome and history of iron deficiency anemia.  In the interim the patient states she has been doing well in terms of her heart but she is having some gallbladder issues.  At this time she has a planned laparoscopic cholecystectomy tomorrow.  She is here with her husband today.  She denies any chest pain, shortness of breath, nausea, vomiting.  She states that she is able to do all of her activity and more without any symptoms of angina.  She is looking forward to her gallbladder surgery.   Of note her aspirin 81 mg was discontinued in August 2020 due to nosebleed.  Past Medical History:  Diagnosis Date  . Angio-edema 09/05/2016  . Arthritis    spine  . CAD (coronary artery disease)   . Cancer ([place])    lung  . Chronic idiopathic urticaria 09/05/2016  . Complication of anesthesia   . COPD (chronic obstructive pulmonary disease) ([place])   . Coronary artery disease involving native coronary artery of native heart with angina pectoris [facility])    Overview:  Anterior MI 2010 with PCI and stent Her last stress test pharmacologic showed normal ejection fraction and no evidence of ischemia. She has a history of previous LAD infarction with severely depressed ejection  fraction that normalized with medical therapy and revascularization percutaneously.  . Family history of colon cancer   . Hematochezia 04/14/2015  . History of bilateral tubal ligation   . Hyperlipidemia   . Lung cancer-right upper lobe 11/15/2014  . Myocardial infarction ([place]) 2010  . Obstructive chronic bronchitis without exacerbation ([place]) 03/01/2015  . Pneumonia    hx  . PONV (postoperative nausea and vomiting)   . Preoperative cardiovascular examination 03/02/2015  . 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 Pure hypercholesterolemia 03/01/2015  . S/P hysterectomy   . S/P lobectomy of lung 03/25/2015  . Shortness of breath dyspnea     Past Surgical History:  Procedure Laterality Date  . APPENDECTOMY    . CARDIAC CATHETERIZATION     stents  . COLONOSCOPY N/A 04/18/2015   Procedure: COLONOSCOPY;  Surgeon: Ladene Artist, MD;  Location: [facility] ENDOSCOPY;  Service: Endoscopy;  Laterality: N/A;  . MOUTH SURGERY     teeth  . TUBAL LIGATION    . VIDEO ASSISTED THORACOSCOPY (VATS)/WEDGE RESECTION Right 03/25/2015   Procedure: VIDEO ASSISTED THORACOSCOPY (VATS)/LUNG RESECTION;  Surgeon: Grace Isaac, MD;  Location: [place];  Service: Thoracic;  Laterality: Right;  Marland Kitchen VIDEO BRONCHOSCOPY N/A 03/25/2015   Procedure: VIDEO BRONCHOSCOPY;  Surgeon: Grace Isaac, MD;  Location: [place];  Service: Thoracic;  Laterality: N/A;  . VIDEO BRONCHOSCOPY WITH ENDOBRONCHIAL ULTRASOUND N/A 04/18/2018   Procedure: VIDEO BRONCHOSCOPY WITH ENDOBRONCHIAL ULTRASOUND;  Surgeon: Lanelle Bal  B, MD;  Location: [facility] OR;  Service: Thoracic;  Laterality: N/A;  . VIDEO BRONCHOSCOPY WITH INSERTION OF INTERBRONCHIAL VALVE (IBV) N/A 04/12/2015   Procedure: VIDEO BRONCHOSCOPY WITH INSERTION OF 7MM INTERBRONCHIAL VALVES (IBV) IN SUPERIOR SEGMENT, MEDIAL AND POSTERIOR SEGMENT, AND LATERAL SEGMENT OF RIGHT LOWER LOBE;  Surgeon: Grace Isaac, MD;  Location: [facility] OR;  Service: Thoracic;  Laterality: N/A;  74mm Spiration Valve placed in  Superior Segment, Medial and Posterior Segment, and Lateral Segment of Right Lower Lobe  . VIDEO BRONCHOSCOPY WITH INSERTION OF INTERBRONCHIAL VALVE (IBV) Right 04/19/2015   Procedure: VIDEO BRONCHOSCOPY WITH INSERTION OF INTERBRONCHIAL VALVE (IBV);  Surgeon: Grace Isaac, MD;  Location: [place];  Service: Thoracic;  Laterality: Right;  Marland Kitchen VIDEO BRONCHOSCOPY WITH INSERTION OF INTERBRONCHIAL VALVE (IBV) N/A 06/03/2015   Procedure: VIDEO BRONCHOSCOPY WITH REMOVAL OF INTERBRONCHIAL VALVE (IBV);  Surgeon: Grace Isaac, MD;  Location: [facility] OR;  Service: Thoracic;  Laterality: N/A;    Current Medications: Current Meds  Medication Sig  . albuterol (PROVENTIL HFA;VENTOLIN HFA) 108 (90 Base) MCG/ACT inhaler Inhale 2 puffs into the lungs every 4 (four) hours as needed for wheezing or shortness of breath.   Marland Kitchen aspirin EC 81 MG tablet Take 81 mg by mouth daily.  . clopidogrel (PLAVIX) 75 MG tablet Take 1 tablet (75 mg total) by mouth daily. (Patient taking differently: Take 75 mg by mouth at bedtime. )  . cycloSPORINE modified (NEORAL) 50 MG capsule 50 mg daily.  . Evolocumab (REPATHA SURECLICK) 322 MG/ML SOAJ Inject 140 mg into the skin every 14 (fourteen) days.  Marland Kitchen gabapentin (NEURONTIN) 100 MG capsule Take 100 mg by mouth 2 (two) times daily.   Marland Kitchen ipratropium-albuterol (DUONEB) 0.5-2.5 (3) MG/3ML SOLN Take 3 mLs by nebulization daily.  . SYMBICORT 160-4.5 MCG/ACT inhaler Inhale 2 puffs into the lungs 2 (two) times daily.  . [DISCONTINUED] cycloSPORINE modified (NEORAL) 25 MG capsule Take 25 mg by mouth 2 (two) times daily.    Current Facility-Administered Medications for the 05/07/19 encounter (Office Visit) with Berniece Salines, DO  Medication  . omalizumab Arvid Right) injection 300 mg     Allergies:   Codeine, Rosuvastatin, and Sympathomimetics   Social History   Socioeconomic History  . Marital status: Married    Spouse name: Not on file  . Number of children: Not on file  . Years of education:  Not on file  . Highest education level: Not on file  Occupational History  . Not on file  Social Needs  . Financial resource strain: Not on file  . Food insecurity    Worry: Not on file    Inability: Not on file  . Transportation needs    Medical: Not on file    Non-medical: Not on file  Tobacco Use  . Smoking status: Former Smoker    Packs/day: 0.50    Years: 40.00    Pack years: 20.00    Types: Cigarettes    Quit date: 03/25/2015    Years since quitting: 4.1  . Smokeless tobacco: Never Used  . Tobacco comment: down to 4 aday  Substance and Sexual Activity  . Alcohol use: No    Alcohol/week: 0.0 standard drinks  . Drug use: No  . Sexual activity: Not on file  Lifestyle  . Physical activity    Days per week: Not on file    Minutes per session: Not on file  . Stress: Not on file  Relationships  . Social connections  Talks on phone: Not on file    Gets together: Not on file    Attends religious service: Not on file    Active member of club or organization: Not on file    Attends meetings of clubs or organizations: Not on file    Relationship status: Not on file  Other Topics Concern  . Not on file  Social History Narrative  . Not on file     Family History: The patient's family history includes Cancer in her brother, father, and sister; Cerebrovascular Disease in her brother and brother; Diabetes in her mother; Heart disease in her brother and brother; Stroke in her sister and sister.  ROS:   Review of Systems  Constitution: Negative for decreased appetite, fever and weight gain.  HENT: Negative for congestion, ear discharge, hoarse voice and sore throat.   Eyes: Negative for discharge, redness, vision loss in right eye and visual halos.  Cardiovascular: Negative for chest pain, dyspnea on exertion, leg swelling, orthopnea and palpitations.  Respiratory: Negative for cough, hemoptysis, shortness of breath and snoring.   Endocrine: Negative for heat intolerance  and polyphagia.  Hematologic/Lymphatic: Negative for bleeding problem. Does not bruise/bleed easily.  Skin: Negative for flushing, nail changes, rash and suspicious lesions.  Musculoskeletal: Negative for arthritis, joint pain, muscle cramps, myalgias, neck pain and stiffness.  Gastrointestinal: Negative for abdominal pain, bowel incontinence, diarrhea and excessive appetite.  Genitourinary: Negative for decreased libido, genital sores and incomplete emptying.  Neurological: Negative for brief paralysis, focal weakness, headaches and loss of balance.  Psychiatric/Behavioral: Negative for altered mental status, depression and suicidal ideas.  Allergic/Immunologic: Negative for HIV exposure and persistent infections.    EKGs/Labs/Other Studies Reviewed:    The following studies were reviewed today:   EKG: Sinus rhythm, 87 bpm, evidence of old septal infarction.  With low precordial leads voltage  Recent Labs: No results found for requested labs within last 8760 hours.  Recent Lipid Panel    Component Value Date/Time   CHOL 115 04/16/2018 1218   TRIG 117 04/16/2018 1218   HDL 43 04/16/2018 1218   CHOLHDL 2.7 04/16/2018 1218   CHOLHDL 5.3 04/19/2009 1130   VLDL 23 04/19/2009 1130   LDLCALC 49 04/16/2018 1218    Physical Exam:    VS:  BP 112/72 (BP Location: Left Arm, Patient Position: Sitting, Cuff Size: Normal)   Pulse 88   Ht 5\' 2"  (1.575 m)   Wt 135 lb (61.2 kg)   SpO2 97%   BMI 24.69 kg/m     Wt Readings from Last 3 Encounters:  05/07/19 135 lb (61.2 kg)  04/18/18 143 lb 3.2 oz (65 kg)  04/17/18 143 lb 3.2 oz (65 kg)     GEN: Well nourished, well developed in no acute distress HEENT: Normal NECK: No JVD; No carotid bruits LYMPHATICS: No lymphadenopathy CARDIAC: S1S2 noted,RRR, no murmurs, rubs, gallops RESPIRATORY:  Clear to auscultation without rales, wheezing or rhonchi  ABDOMEN: Soft, non-tender, non-distended, +bowel sounds, no guarding. EXTREMITIES: No  edema, No cyanosis, no clubbing MUSCULOSKELETAL:  No edema; No deformity  SKIN: Warm and dry NEUROLOGIC:  Alert and oriented x 3, non-focal PSYCHIATRIC:  Normal affect, good insight  ASSESSMENT:    1. Coronary artery disease involving native coronary artery of native heart without angina pectoris   2. Hyperlipidemia, unspecified hyperlipidemia type   3. Preoperative cardiovascular examination    PLAN:    1.  The patient appears to be doing well from a cardiovascular standpoint.  She has been off her Plavix for 1 week.  Please resume Plavix 75 mg daily as soon as possible post surgery at the discretion of the surgeon.  2.  At this time, the patient does not have any unstable cardiac conditions.  Upon evaluation today, she can achieve 4 METs or greater without anginal symptoms.  According to [facility] and AHA guidelines, she requires no further cardiac workup prior to her noncardiac surgery (laparoscopic cholecystectomy) and should be at acceptable risk.  Patient can therefore proceed with her surgery.  The patient is in agreement with the above plan. The patient left the office in stable condition. She already has a scheduled follow-up with Dr. Bettina Gavia in November.   Medication Adjustments/Labs and Tests Ordered: Current medicines are reviewed at length with the patient today.  Concerns regarding medicines are outlined above.  No orders of the defined types were placed in this encounter.  No orders of the defined types were placed in this encounter.   Patient Instructions  Medication Instructions:  Your physician recommends that you continue on your current medications as directed. Please refer to the Current Medication list given to you today.  *If you need a refill on your cardiac medications before your next appointment, please call your pharmacy*  Lab Work: None If you have labs (blood work) drawn today and your tests are completely normal, you will receive your results only by: Marland Kitchen  MyChart Message (if you have MyChart) OR . A paper copy in the mail If you have any lab test that is abnormal or we need to change your treatment, we will call you to review the results.  Testing/Procedures: None  Follow-Up: At [facility], you and your health needs are our priority.  As part of our continuing mission to provide you with exceptional heart care, we have created designated Provider Care Teams.  These Care Teams include your primary Cardiologist (physician) and Advanced Practice Providers (APPs -  Physician Assistants and Nurse Practitioners) who all work together to provide you with the care you need, when you need it.  Your next appointment:   November  The format for your next appointment:   In Person  Provider:   Shirlee More, MD  Other Instructions      Adopting a Healthy Lifestyle.  Know what a healthy weight is for you (roughly BMI <25) and aim to maintain this   Aim for 7+ servings of fruits and vegetables daily   65-80+ fluid ounces of water or unsweet tea for healthy kidneys   Limit to max 1 drink of alcohol per day; avoid smoking/tobacco   Limit animal fats in diet for cholesterol and heart health - choose grass fed whenever available   Avoid highly processed foods, and foods high in saturated/trans fats   Aim for low stress - take time to unwind and care for your mental health   Aim for 150 min of moderate intensity exercise weekly for heart health, and weights twice weekly for bone health   Aim for 7-9 hours of sleep daily   When it comes to diets, agreement about the perfect plan isnt easy to find, even among the experts. Experts at the [employer] developed an idea known as the Healthy Eating Plate. Just imagine a plate divided into logical, healthy portions.   The emphasis is on diet quality:   Load up on vegetables and fruits - one-half of your plate: Aim for color and variety, and remember that potatoes  dont  count.   Go for whole grains - one-quarter of your plate: Whole wheat, barley, wheat berries, quinoa, oats, brown rice, and foods made with them. If you want pasta, go with whole wheat pasta.   Protein power - one-quarter of your plate: Fish, chicken, beans, and nuts are all healthy, versatile protein sources. Limit red meat.   The diet, however, does go beyond the plate, offering a few other suggestions.   Use healthy plant oils, such as olive, canola, soy, corn, sunflower and peanut. Check the labels, and avoid partially hydrogenated oil, which have unhealthy trans fats.   If youre thirsty, drink water. Coffee and tea are good in moderation, but skip sugary drinks and limit milk and dairy products to one or two daily servings.   The type of carbohydrate in the diet is more important than the amount. Some sources of carbohydrates, such as vegetables, fruits, whole grains, and beans-are healthier than others.   Finally, stay active  Signed, Berniece Salines, DO  05/07/2019 9:47 AM    [place]

## 2019-05-25 DIAGNOSIS — C778 Secondary and unspecified malignant neoplasm of lymph nodes of multiple regions: Secondary | ICD-10-CM | POA: Diagnosis not present

## 2019-05-25 DIAGNOSIS — C3411 Malignant neoplasm of upper lobe, right bronchus or lung: Secondary | ICD-10-CM | POA: Diagnosis not present

## 2019-06-22 DIAGNOSIS — C3411 Malignant neoplasm of upper lobe, right bronchus or lung: Secondary | ICD-10-CM | POA: Diagnosis not present

## 2019-06-22 DIAGNOSIS — C778 Secondary and unspecified malignant neoplasm of lymph nodes of multiple regions: Secondary | ICD-10-CM | POA: Diagnosis not present

## 2019-07-06 ENCOUNTER — Telehealth: Payer: Self-pay | Admitting: Cardiology

## 2019-07-06 ENCOUNTER — Other Ambulatory Visit: Payer: Self-pay | Admitting: *Deleted

## 2019-07-06 MED ORDER — CLOPIDOGREL BISULFATE 75 MG PO TABS: 75.0000 mg | ORAL_TABLET | Freq: Every day | ORAL | 1 refills | Status: DC

## 2019-07-06 NOTE — Telephone Encounter (Signed)
Call plavix to [facility]

## 2019-07-06 NOTE — Telephone Encounter (Signed)
Refill sent.

## 2019-07-15 NOTE — Progress Notes (Signed)
Cardiology Office Note:    Date:  07/16/2019   ID:  Michelle King, DOB 1960-08-16, MRN [id number]  PCP:  Charlotte Sanes, MD  Cardiologist:  Shirlee More, MD    Referring MD: Charlotte Sanes, MD    ASSESSMENT:    1. Coronary artery disease involving native coronary artery of native heart with angina pectoris ([place])   2. Mixed hyperlipidemia   3. Statin intolerance   4. Other iron deficiency anemia   5. Chronic obstructive pulmonary disease, unspecified COPD type ([place])   6. Malignant neoplasm of upper lobe of right lung (HCC)    PLAN:    In order of problems listed above:  1. Stable CAD New [place] Heart Association class I having no anginal discomfort continue her long-term dual antiplatelet therapy she has had no bleeding complications and substitute for lipid-lowering treatment. 2. Difficult issue she is absolutely statin intolerant no longer tolerate the injections of PCSK9 alternate chosen I will ask an oncologist check a lipid profile in 6 to 8 weeks in her office 3. Iron deficiency anemia and restless leg syndrome improved 4. COPD is stable continue her bronchodilators 5. Managed by oncology she has had a good response to treatment hope she gets a good long-term response   Next appointment: 6 month   Medication Adjustments/Labs and Tests Ordered: Current medicines are reviewed at length with the patient today.  Concerns regarding medicines are outlined above.  No orders of the defined types were placed in this encounter.  No orders of the defined types were placed in this encounter.   Chief Complaint  Patient presents with  . Follow-up  . Coronary Artery Disease    History of Present Illness:    Michelle King is a 58 y.o. female with a hx of  hx of STEMI, S/P PCI of proximal LAD, CAD Dyslipidemia statin imtolerant with an LDL of 155 on ezetamide. , severe COPD and lung cancer last seen 05/07/2019 Dr Harriet Masson. 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  She completed XRT chemotherapy and from her description she is on immune checkpoint inhibitor treatment at this time.  She has had no cardiotoxicity no cardiac testing.  Prior to finishing the note will access labs and CT scan from [facility] and do an EKG.  From cardiac perspective she is done well is not having angina palpitation edema or shortness of breath and her COPD is improved.  She is absolutely statin intolerant and her numbers were good on PCSK9 canine but she tells me she gets skin irritation and subcutaneous pain and in her opinion she has to stop using it.  Substitute bempedoic acid and ezetimibe.  Ask her oncologist to check a lipid profile in about 6 weeks.  Her EKG is unremarkable and on think she needs an echocardiogram.  Her deficiency anemia and restless leg is improved.  From [facility] 07/06/2019 hemoglobin 12.2 MCV is normal at 86 platelets 257,000 CMP potassium 3.8 GFR greater than 60 cc ferritin 36.1 iron saturation 27% cholesterol 157 LDL 88 HDL 51 CT of chest 06/19/2019 showed stable lymph nodes of his IMA right upper lobectomy superimposed volume loss and bronchiectasis coronary calcification was present heart size normal no pericardial effusion Past Medical History:  Diagnosis Date  . Angio-edema 09/05/2016  . Arthritis    spine  . CAD (coronary artery disease)   .  Cancer ([place])    lung  . Chronic idiopathic urticaria 09/05/2016  . Complication of anesthesia   . COPD (chronic obstructive pulmonary disease) ([place])   . Coronary artery disease involving native coronary artery of native heart with angina pectoris [facility])    Overview:  Anterior MI 2010 with PCI and stent Her last stress test pharmacologic showed normal ejection fraction and no evidence of ischemia. She has a history of previous LAD  infarction with severely depressed ejection fraction that normalized with medical therapy and revascularization percutaneously.  . Family history of colon cancer   . Hematochezia 04/14/2015  . History of bilateral tubal ligation   . Hyperlipidemia   . Lung cancer-right upper lobe 11/15/2014  . Myocardial infarction ([place]) 2010  . Obstructive chronic bronchitis without exacerbation ([place]) 03/01/2015  . Pneumonia    hx  . PONV (postoperative nausea and vomiting)   . Preoperative cardiovascular examination 03/02/2015  . 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 Pure hypercholesterolemia 03/01/2015  . S/P hysterectomy   . S/P lobectomy of lung 03/25/2015  . Shortness of breath dyspnea     Past Surgical History:  Procedure Laterality Date  . APPENDECTOMY    . CARDIAC CATHETERIZATION     stents  . COLONOSCOPY N/A 04/18/2015   Procedure: COLONOSCOPY;  Surgeon: Ladene Artist, MD;  Location: [facility] ENDOSCOPY;  Service: Endoscopy;  Laterality: N/A;  . MOUTH SURGERY     teeth  . TUBAL LIGATION    . VIDEO ASSISTED THORACOSCOPY (VATS)/WEDGE RESECTION Right 03/25/2015   Procedure: VIDEO ASSISTED THORACOSCOPY (VATS)/LUNG RESECTION;  Surgeon: Grace Isaac, MD;  Location: [place];  Service: Thoracic;  Laterality: Right;  Marland Kitchen VIDEO BRONCHOSCOPY N/A 03/25/2015   Procedure: VIDEO BRONCHOSCOPY;  Surgeon: Grace Isaac, MD;  Location: [place];  Service: Thoracic;  Laterality: N/A;  . VIDEO BRONCHOSCOPY WITH ENDOBRONCHIAL ULTRASOUND N/A 04/18/2018   Procedure: VIDEO BRONCHOSCOPY WITH ENDOBRONCHIAL ULTRASOUND;  Surgeon: Grace Isaac, MD;  Location: [facility] OR;  Service: Thoracic;  Laterality: N/A;  . VIDEO BRONCHOSCOPY WITH INSERTION OF INTERBRONCHIAL VALVE (IBV) N/A 04/12/2015   Procedure: VIDEO BRONCHOSCOPY WITH INSERTION OF 7MM INTERBRONCHIAL VALVES (IBV) IN SUPERIOR SEGMENT, MEDIAL AND POSTERIOR SEGMENT, AND LATERAL SEGMENT OF RIGHT LOWER LOBE;  Surgeon: Grace Isaac, MD;  Location: [facility] OR;  Service: Thoracic;   Laterality: N/A;  39mm Spiration Valve placed in Superior Segment, Medial and Posterior Segment, and Lateral Segment of Right Lower Lobe  . VIDEO BRONCHOSCOPY WITH INSERTION OF INTERBRONCHIAL VALVE (IBV) Right 04/19/2015   Procedure: VIDEO BRONCHOSCOPY WITH INSERTION OF INTERBRONCHIAL VALVE (IBV);  Surgeon: Grace Isaac, MD;  Location: [place];  Service: Thoracic;  Laterality: Right;  Marland Kitchen VIDEO BRONCHOSCOPY WITH INSERTION OF INTERBRONCHIAL VALVE (IBV) N/A 06/03/2015   Procedure: VIDEO BRONCHOSCOPY WITH REMOVAL OF INTERBRONCHIAL VALVE (IBV);  Surgeon: Grace Isaac, MD;  Location: [facility] OR;  Service: Thoracic;  Laterality: N/A;    Current Medications: Current Meds  Medication Sig  . albuterol (PROVENTIL HFA;VENTOLIN HFA) 108 (90 Base) MCG/ACT inhaler Inhale 2 puffs into the lungs every 4 (four) hours as needed for wheezing or shortness of breath.   Marland Kitchen aspirin EC 81 MG tablet Take 81 mg by mouth daily.  Marland Kitchen BREO ELLIPTA 100-25 MCG/INH AEPB 1 puff as needed.  . clopidogrel (PLAVIX) 75 MG tablet Take 1 tablet (75 mg total) by mouth daily.  . cycloSPORINE modified (NEORAL) 50 MG capsule 50 mg daily.  . Evolocumab (REPATHA SURECLICK) 782 MG/ML SOAJ Inject 140 mg into the  skin every 14 (fourteen) days.  Marland Kitchen gabapentin (NEURONTIN) 100 MG capsule Take 100 mg by mouth 2 (two) times daily.   Marland Kitchen oxyCODONE (OXY IR/ROXICODONE) 5 MG immediate release tablet Take 5 mg by mouth every 6 (six) hours as needed.  . SYMBICORT 160-4.5 MCG/ACT inhaler Inhale 2 puffs into the lungs as needed.    Current Facility-Administered Medications for the 07/16/19 encounter (Office Visit) with Richardo Priest, MD  Medication  . omalizumab Arvid Right) injection 300 mg     Allergies:   Codeine, Rosuvastatin, and Sympathomimetics   Social History   Socioeconomic History  . Marital status: Married    Spouse name: Not on file  . Number of children: Not on file  . Years of education: Not on file  . Highest education level: Not on  file  Occupational History  . Not on file  Tobacco Use  . Smoking status: Former Smoker    Packs/day: 0.50    Years: 40.00    Pack years: 20.00    Types: Cigarettes    Quit date: 03/25/2015    Years since quitting: 4.3  . Smokeless tobacco: Never Used  . Tobacco comment: down to 4 aday  Substance and Sexual Activity  . Alcohol use: No    Alcohol/week: 0.0 standard drinks  . Drug use: No  . Sexual activity: Not on file  Other Topics Concern  . Not on file  Social History Narrative  . Not on file   Social Determinants of Health   Financial Resource Strain:   . Difficulty of Paying Living Expenses: Not on file  Food Insecurity:   . Worried About Charity fundraiser in the Last Year: Not on file  . Ran Out of Food in the Last Year: Not on file  Transportation Needs:   . Lack of Transportation (Medical): Not on file  . Lack of Transportation (Non-Medical): Not on file  Physical Activity:   . Days of Exercise per Week: Not on file  . Minutes of Exercise per Session: Not on file  Stress:   . Feeling of Stress : Not on file  Social Connections:   . Frequency of Communication with Friends and Family: Not on file  . Frequency of Social Gatherings with Friends and Family: Not on file  . Attends Religious Services: Not on file  . Active Member of Clubs or Organizations: Not on file  . Attends Archivist Meetings: Not on file  . Marital Status: Not on file     Family History: The patient's family history includes Cancer in her brother, father, and sister; Cerebrovascular Disease in her brother and brother; Diabetes in her mother; Heart disease in her brother and brother; Stroke in her sister and sister. ROS:   Please see the history of present illness.    All other systems reviewed and are negative.  EKGs/Labs/Other Studies Reviewed:    The following studies were reviewed today:  EKG:  EKG ordered today and personally reviewed.  The ekg ordered today demonstrates  sinus rhythm QS in V2 consistent with old septal infarction as well as QS in 3 and aVF no ischemic changes normal QT interval  Recent Labs: No results found for requested labs within last 8760 hours.  Recent Lipid Panel    Component Value Date/Time   CHOL 115 04/16/2018 1218   TRIG 117 04/16/2018 1218   HDL 43 04/16/2018 1218   CHOLHDL 2.7 04/16/2018 1218   CHOLHDL 5.3 04/19/2009 1130   VLDL  23 04/19/2009 1130   LDLCALC 49 04/16/2018 1218    Physical Exam:    VS:  BP 118/68   Pulse 86   Ht 5\' 2"  (1.575 m)   Wt 136 lb 12.8 oz (62.1 kg)   SpO2 97%   BMI 25.02 kg/m     Wt Readings from Last 3 Encounters:  07/16/19 136 lb 12.8 oz (62.1 kg)  05/07/19 135 lb (61.2 kg)  04/18/18 143 lb 3.2 oz (65 kg)     GEN: She looks improved from her previous visits where she was almost cachectic in appearance well nourished, well developed in no acute distress HEENT: Normal NECK: No JVD; No carotid bruits LYMPHATICS: Fullness right supraclavicular area where she tells me she has adenopathy CARDIAC: RRR, no murmurs, rubs, gallops RESPIRATORY:  Clear to auscultation without rales, wheezing or rhonchi  ABDOMEN: Soft, non-tender, non-distended MUSCULOSKELETAL:  No edema; No deformity  SKIN: Warm and dry NEUROLOGIC:  Alert and oriented x 3 PSYCHIATRIC:  Normal affect    Signed, Shirlee More, MD  07/16/2019 11:22 AM    [place]

## 2019-07-16 ENCOUNTER — Other Ambulatory Visit: Payer: Self-pay

## 2019-07-16 ENCOUNTER — Encounter: Payer: Self-pay | Admitting: Cardiology

## 2019-07-16 ENCOUNTER — Ambulatory Visit (INDEPENDENT_AMBULATORY_CARE_PROVIDER_SITE_OTHER): Payer: Medicare HMO | Admitting: Cardiology

## 2019-07-16 VITALS — BP 118/68 | HR 86 | Ht 62.0 in | Wt 136.8 lb

## 2019-07-16 DIAGNOSIS — E782 Mixed hyperlipidemia: Secondary | ICD-10-CM

## 2019-07-16 DIAGNOSIS — I25119 Atherosclerotic heart disease of native coronary artery with unspecified angina pectoris: Secondary | ICD-10-CM

## 2019-07-16 DIAGNOSIS — D508 Other iron deficiency anemias: Secondary | ICD-10-CM

## 2019-07-16 DIAGNOSIS — Z789 Other specified health status: Secondary | ICD-10-CM

## 2019-07-16 DIAGNOSIS — J449 Chronic obstructive pulmonary disease, unspecified: Secondary | ICD-10-CM

## 2019-07-16 DIAGNOSIS — C3411 Malignant neoplasm of upper lobe, right bronchus or lung: Secondary | ICD-10-CM

## 2019-07-16 MED ORDER — BEMPEDOIC ACID-EZETIMIBE 180-10 MG PO TABS: 1.0000 | ORAL_TABLET | Freq: Every day | ORAL | 4 refills | Status: DC

## 2019-07-16 NOTE — Addendum Note (Signed)
Addended by: Beckey Rutter on: 07/16/2019 11:38 AM   Modules accepted: Orders

## 2019-07-16 NOTE — Patient Instructions (Signed)
Medication Instructions:  Your physician has recommended you make the following change in your medication:   START taking bempedoic acid/zetia (1 tablet) once daily  *If you need a refill on your cardiac medications before your next appointment, please call your pharmacy*  Lab Work: NONE If you have labs (blood work) drawn today and your tests are completely normal, you will receive your results only by: Marland Kitchen MyChart Message (if you have MyChart) OR . A paper copy in the mail If you have any lab test that is abnormal or we need to change your treatment, we will call you to review the results.  Testing/Procedures: You had an EKG Performed today  Follow-Up: At [facility], you and your health needs are our priority.  As part of our continuing mission to provide you with exceptional heart care, we have created designated Provider Care Teams.  These Care Teams include your primary Cardiologist (physician) and Advanced Practice Providers (APPs -  Physician Assistants and Nurse Practitioners) who all work together to provide you with the care you need, when you need it.  Your next appointment:   6 month(s)  The format for your next appointment:   In Person  Provider:   Shirlee More, MD  Other Instructions Bempedoic acid; Ezetimibe Tablets What is this medicine? BEMPEDOIC ACID; EZETIMIBE (BEM pe DOE ik AS id; ez ET i mibe) is used to lower the level of cholesterol in the blood. It is used with other cholesterol-lowering drugs. This medicine may be used for other purposes; ask your health care provider or pharmacist if you have questions. COMMON BRAND NAME(S): NEXLIZET What should I tell my health care provider before I take this medicine? They need to know if you have any of these conditions:  gout  kidney problems  liver problems  tendon problems  an unusual or allergic reaction to bempedoic acid, ezetimibe, other medicines, foods, dyes, or preservatives  pregnant or trying  to become pregnant  breast-feeding How should I use this medicine? Take this medicine by mouth with a glass of water. Follow the directions on the prescription label. Do not cut, crush, or chew this medicine. Swallow the tablets whole. You can take it with or without food. If it upsets your stomach, take it with food. Take your doses at regular intervals. Do not take your medicine more often than directed. Talk to your pediatrician about the use of this medicine in children. Special care may be needed. Overdosage: If you think you have taken too much of this medicine contact a poison control center or emergency room at once. NOTE: This medicine is only for you. Do not share this medicine with others. What if I miss a dose? If you miss a dose, take it as soon as you can. If it is almost time for your next dose, take only that dose. Do not take double or extra doses. What may interact with this medicine? Do not take this medicine with any of the following medications:  fenofibrate  gemfibrozil This medicine may also interact with the following medications:  antacids  cyclosporine  pravastatin  simvastatin  other medicines to lower cholesterol or triglycerides This list may not describe all possible interactions. Give your health care provider a list of all the medicines, herbs, non-prescription drugs, or dietary supplements you use. Also tell them if you smoke, drink alcohol, or use illegal drugs. Some items may interact with your medicine. What should I watch for while using this medicine? Visit your health  care professional for regular checks on your progress. Tell your health care professional if your symptoms do not start to get better or if they get worse. You may need blood work done while you are taking this medicine. This drug is only part of a total heart-health program. Your doctor or a dietician can suggest a low-cholesterol and low-fat diet to help. Avoid alcohol and smoking,  and keep a proper exercise schedule. What side effects may I notice from receiving this medicine? Side effects that you should report to your doctor or health care professional as soon as possible:  allergic reactions like skin rash, itching or hives, swelling of the face, lips, or tongue  signs of gout such as swollen, red, warm, or tender joints, especially in the toes  signs of tendon problems such as tendon pain or swelling or if you are unable to move a joint Side effects that usually do not require medical attention (report these to your doctor or health care professional if they continue or are bothersome):  back pain  cold or flu-like symptoms  headache  muscle spasms  stomach upset or pain This list may not describe all possible side effects. Call your doctor for medical advice about side effects. You may report side effects to FDA at 1-800-FDA-1088. Where should I keep my medicine? Keep out of the reach of children. Store at room temperature between 15 and 30 degrees C (59 and 86 degrees F). Keep this medicine in the original container. Do not throw out the packet in the container. It keeps the medicine dry. Throw away any unused medication after the expiration date. NOTE: This sheet is a summary. It may not cover all possible information. If you have questions about this medicine, talk to your doctor, pharmacist, or health care provider.  2020 Elsevier/Gold Standard (2018-09-17 13:09:47)

## 2019-07-16 NOTE — Addendum Note (Signed)
Addended by: Jeremy Johann on: 07/16/2019 11:33 AM   Modules accepted: Orders

## 2019-07-20 DIAGNOSIS — C3411 Malignant neoplasm of upper lobe, right bronchus or lung: Secondary | ICD-10-CM | POA: Diagnosis not present

## 2019-07-23 NOTE — Progress Notes (Signed)
KeySharyn Blitz - PA Case ID: [id number] - Rx #: [id number] Need help? Call us at [phone] Status Sent to [place] 180-10MG  tablets Form [employer] Electronic PA Form

## 2019-08-17 DIAGNOSIS — C3411 Malignant neoplasm of upper lobe, right bronchus or lung: Secondary | ICD-10-CM

## 2019-09-28 DIAGNOSIS — C3411 Malignant neoplasm of upper lobe, right bronchus or lung: Secondary | ICD-10-CM

## 2019-10-30 DIAGNOSIS — C3411 Malignant neoplasm of upper lobe, right bronchus or lung: Secondary | ICD-10-CM

## 2020-01-12 ENCOUNTER — Other Ambulatory Visit: Payer: Self-pay | Admitting: Cardiology

## 2020-02-14 NOTE — Progress Notes (Signed)
Cardiology Office Note:    Date:  02/15/2020   ID:  Michelle King, DOB 03/25/1961, MRN [id number]  PCP:  Charlotte Sanes, MD  Cardiologist:  Shirlee More, MD    Referring MD: Charlotte Sanes, MD    ASSESSMENT:    1. Coronary artery disease involving native coronary artery of native heart with angina pectoris ([place])   2. Mixed hyperlipidemia    PLAN:    In order of problems listed above:  1. Is a very difficult case with complex issues including her lung cancer coronary disease severe dyslipidemia and chronic urticaria.  Presently on no lipid-lowering treatment and she will be seen by allergy later this week and had like an answer whether we can initiate therapy either using monoclonal antibody PCSK9 inhibitor or bempedoic acid and Zetia.  I will recheck comprehensive panel and a lipid profile today EKG before leaving the office having no angina therapy is clopidogrel which can cause urticaria. 2. Controlled await answer from pulmonary allergy regarding initiation of therapy 3. Lung cancer stable currently on no treatment.   Next appointment: This   Medication Adjustments/Labs and Tests Ordered: Current medicines are reviewed at length with the patient today.  Concerns regarding medicines are outlined above.  Orders Placed This Encounter  Procedures  . Comprehensive metabolic panel  . Lipid panel  . EKG 12-Lead   No orders of the defined types were placed in this encounter.   Chief Complaint  Patient presents with  . Follow-up  . Coronary Artery Disease  . Hyperlipidemia    History of Present Illness:    Michelle King is a 59 y.o. female with a hx of STEMI, S/P PCI of proximal LAD, CAD Dyslipidemia statin imtolerant with an LDL of 155 on ezetamide. , severe COPD and lung cancer with recurrence treated with XRT chemotherapy and immune checkpoint inhibitor.  She had skin irritation with PCSK9 inhibitor was placed on bempedoic acid. She was  last seen 07/16/2019. Compliance with diet, lifestyle and medications: Yes  Presently taking no lipid-lowering therapy never started bempedoic acid.  She tells me she looked a literature given to her by allergy this it is contraindicated with cyclosporine.  She is seeing that physician this Wednesday and I will send a copy of my note she is a very high risk requires lipid-lowering therapy and we can need to use bempedoic acid and Zetia or PCSK9 therapy.  No chest pain shortness of breath palpitation or syncope.  Urticaria are in remission.  She had chest x-ray and rib x-rays performed 02/05/2020 showed postsurgical changes of the left upper lobe and volume loss in the right hemithorax for any acute abnormalities.  CT of the chest 10/29/2019 showed changes following right upper lobe resection no evidence of recurrent disease and extensive radiation changes in the lungs. Past Medical History:  Diagnosis Date  . Angio-edema 09/05/2016  . Arthritis    spine  . CAD (coronary artery disease)   . Cancer ([place])    lung  . Chronic idiopathic urticaria 09/05/2016  . Complication of anesthesia   . COPD (chronic obstructive pulmonary disease) ([place])   . Coronary artery disease involving native coronary artery of native heart with angina pectoris [facility])    Overview:  Anterior MI 2010 with PCI and stent Her last stress test pharmacologic showed normal ejection fraction and no evidence of ischemia. She has a history of previous LAD infarction with severely depressed ejection fraction that normalized with medical therapy and revascularization percutaneously.  Marland Kitchen  Family history of colon cancer   . Hematochezia 04/14/2015  . History of bilateral tubal ligation   . Hyperlipidemia   . Lung cancer-right upper lobe 11/15/2014  . Myocardial infarction ([place]) 2010  . Obstructive chronic bronchitis without exacerbation ([place]) 03/01/2015  . Pneumonia    hx  . PONV (postoperative nausea and vomiting)   . Preoperative  cardiovascular examination 03/02/2015  . 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 Pure hypercholesterolemia 03/01/2015  . S/P hysterectomy   . S/P lobectomy of lung 03/25/2015  . Shortness of breath dyspnea     Past Surgical History:  Procedure Laterality Date  . APPENDECTOMY    . CARDIAC CATHETERIZATION     stents  . COLONOSCOPY N/A 04/18/2015   Procedure: COLONOSCOPY;  Surgeon: Ladene Artist, MD;  Location: [facility] ENDOSCOPY;  Service: Endoscopy;  Laterality: N/A;  . MOUTH SURGERY     teeth  . TUBAL LIGATION    . VIDEO ASSISTED THORACOSCOPY (VATS)/WEDGE RESECTION Right 03/25/2015   Procedure: VIDEO ASSISTED THORACOSCOPY (VATS)/LUNG RESECTION;  Surgeon: Grace Isaac, MD;  Location: [facility];  Service: Thoracic;  Laterality: Right;  Marland Kitchen VIDEO BRONCHOSCOPY N/A 03/25/2015   Procedure: VIDEO BRONCHOSCOPY;  Surgeon: Grace Isaac, MD;  Location: [place];  Service: Thoracic;  Laterality: N/A;  . VIDEO BRONCHOSCOPY WITH ENDOBRONCHIAL ULTRASOUND N/A 04/18/2018   Procedure: VIDEO BRONCHOSCOPY WITH ENDOBRONCHIAL ULTRASOUND;  Surgeon: Grace Isaac, MD;  Location: [facility] OR;  Service: Thoracic;  Laterality: N/A;  . VIDEO BRONCHOSCOPY WITH INSERTION OF INTERBRONCHIAL VALVE (IBV) N/A 04/12/2015   Procedure: VIDEO BRONCHOSCOPY WITH INSERTION OF 7MM INTERBRONCHIAL VALVES (IBV) IN SUPERIOR SEGMENT, MEDIAL AND POSTERIOR SEGMENT, AND LATERAL SEGMENT OF RIGHT LOWER LOBE;  Surgeon: Grace Isaac, MD;  Location: [facility] OR;  Service: Thoracic;  Laterality: N/A;  60mm Spiration Valve placed in Superior Segment, Medial and Posterior Segment, and Lateral Segment of Right Lower Lobe  . VIDEO BRONCHOSCOPY WITH INSERTION OF INTERBRONCHIAL VALVE (IBV) Right 04/19/2015   Procedure: VIDEO BRONCHOSCOPY WITH INSERTION OF INTERBRONCHIAL VALVE (IBV);  Surgeon: Grace Isaac, MD;  Location: [place];  Service: Thoracic;  Laterality: Right;  Marland Kitchen VIDEO BRONCHOSCOPY WITH INSERTION OF INTERBRONCHIAL VALVE (IBV) N/A 06/03/2015   Procedure: VIDEO  BRONCHOSCOPY WITH REMOVAL OF INTERBRONCHIAL VALVE (IBV);  Surgeon: Grace Isaac, MD;  Location[facility] OR;  Service: Thoracic;  Laterality: N/A;    Current Medications: Current Meds  Medication Sig  . albuterol (PROVENTIL HFA;VENTOLIN HFA) 108 (90 Base) MCG/ACT inhaler Inhale 2 puffs into the lungs every 4 (four) hours as needed for wheezing or shortness of breath.   Marland Kitchen BREO ELLIPTA 100-25 MCG/INH AEPB 1 puff as needed.  . cetirizine (ZYRTEC) 10 MG tablet Take 10 mg by mouth daily.  . clopidogrel (PLAVIX) 75 MG tablet Take 1 tablet by mouth once daily  . cyclobenzaprine (FLEXERIL) 10 MG tablet Take 10 mg by mouth 3 (three) times daily as needed.  . cycloSPORINE modified (NEORAL) 50 MG capsule 50 mg daily.  Marland Kitchen gabapentin (NEURONTIN) 100 MG capsule Take 100 mg by mouth 2 (two) times daily.   Marland Kitchen ibuprofen (ADVIL) 600 MG tablet Take 600 mg by mouth 3 (three) times daily as needed.  . SYMBICORT 160-4.5 MCG/ACT inhaler Inhale 2 puffs into the lungs as needed.   . traMADol (ULTRAM) 50 MG tablet Take 50 mg by mouth 2 (two) times daily.   Current Facility-Administered Medications for the 02/15/20 encounter (Office Visit) with Richardo Priest, MD  Medication  . omalizumab Arvid Right) injection  300 mg     Allergies:   Codeine, Rosuvastatin, and Sympathomimetics   Social History   Socioeconomic History  . Marital status: Married    Spouse name: Not on file  . Number of children: Not on file  . Years of education: Not on file  . Highest education level: Not on file  Occupational History  . Not on file  Tobacco Use  . Smoking status: Former Smoker    Packs/day: 0.50    Years: 40.00    Pack years: 20.00    Types: Cigarettes    Quit date: 03/25/2015    Years since quitting: 4.8  . Smokeless tobacco: Never Used  . Tobacco comment: down to 4 aday  Vaping Use  . Vaping Use: Never used  Substance and Sexual Activity  . Alcohol use: No    Alcohol/week: 0.0 standard drinks  . Drug use: No  .  Sexual activity: Not on file  Other Topics Concern  . Not on file  Social History Narrative  . Not on file   Social Determinants of Health   Financial Resource Strain:   . Difficulty of Paying Living Expenses:   Food Insecurity:   . Worried About Charity fundraiser in the Last Year:   . Arboriculturist in the Last Year:   Transportation Needs:   . Film/video editor (Medical):   Marland Kitchen Lack of Transportation (Non-Medical):   Physical Activity:   . Days of Exercise per Week:   . Minutes of Exercise per Session:   Stress:   . Feeling of Stress :   Social Connections:   . Frequency of Communication with Friends and Family:   . Frequency of Social Gatherings with Friends and Family:   . Attends Religious Services:   . Active Member of Clubs or Organizations:   . Attends Archivist Meetings:   Marland Kitchen Marital Status:      Family History: The patient's family history includes Cancer in her brother, father, and sister; Cerebrovascular Disease in her brother and brother; Diabetes in her mother; Heart disease in her brother and brother; Stroke in her sister and sister. ROS:   Please see the history of present illness.    All other systems reviewed and are negative.  EKGs/Labs/Other Studies Reviewed:    The following studies were reviewed today:    Recent Labs: No results found for requested labs within last 8760 hours.  Recent Lipid Panel    Component Value Date/Time   CHOL 115 04/16/2018 1218   TRIG 117 04/16/2018 1218   HDL 43 04/16/2018 1218   CHOLHDL 2.7 04/16/2018 1218   CHOLHDL 5.3 04/19/2009 1130   VLDL 23 04/19/2009 1130   LDLCALC 49 04/16/2018 1218    Physical Exam:    VS:  BP 90/70 (BP Location: Right Arm, Patient Position: Sitting, Cuff Size: Normal)   Pulse 82   Ht 5\' 2"  (1.575 m)   Wt 135 lb (61.2 kg)   SpO2 97%   BMI 24.69 kg/m     Wt Readings from Last 3 Encounters:  02/15/20 135 lb (61.2 kg)  07/16/19 136 lb 12.8 oz (62.1 kg)  05/07/19  135 lb (61.2 kg)     GEN:  Well nourished, well developed in no acute distress HEENT: Normal NECK: No JVD; No carotid bruits LYMPHATICS: No lymphadenopathy CARDIAC: RRR, no murmurs, rubs, gallops RESPIRATORY:  Clear to auscultation without rales, wheezing or rhonchi  ABDOMEN: Soft, non-tender, non-distended MUSCULOSKELETAL:  No  edema; No deformity  SKIN: Warm and dry NEUROLOGIC:  Alert and oriented x 3 PSYCHIATRIC:  Normal affect    Signed, Shirlee More, MD  02/15/2020 9:41 AM    [place]

## 2020-02-15 ENCOUNTER — Ambulatory Visit (INDEPENDENT_AMBULATORY_CARE_PROVIDER_SITE_OTHER): Payer: Medicare HMO | Admitting: Cardiology

## 2020-02-15 ENCOUNTER — Encounter: Payer: Self-pay | Admitting: Cardiology

## 2020-02-15 ENCOUNTER — Other Ambulatory Visit: Payer: Self-pay

## 2020-02-15 VITALS — BP 90/70 | HR 82 | Ht 62.0 in | Wt 135.0 lb

## 2020-02-15 DIAGNOSIS — E782 Mixed hyperlipidemia: Secondary | ICD-10-CM | POA: Diagnosis not present

## 2020-02-15 DIAGNOSIS — I25119 Atherosclerotic heart disease of native coronary artery with unspecified angina pectoris: Secondary | ICD-10-CM | POA: Diagnosis not present

## 2020-02-15 LAB — COMPREHENSIVE METABOLIC PANEL
ALT: 7 IU/L (ref 0–32)
AST: 13 IU/L (ref 0–40)
Albumin/Globulin Ratio: 1.7 (ref 1.2–2.2)
Albumin: 4.3 g/dL (ref 3.8–4.9)
Alkaline Phosphatase: 93 IU/L (ref 48–121)
BUN/Creatinine Ratio: 23 (ref 12–28)
BUN: 16 mg/dL (ref 8–27)
Bilirubin Total: 0.5 mg/dL (ref 0.0–1.2)
CO2: 23 mmol/L (ref 20–29)
Calcium: 9.6 mg/dL (ref 8.7–10.3)
Chloride: 103 mmol/L (ref 96–106)
Creatinine, Ser: 0.7 mg/dL (ref 0.57–1.00)
GFR calc Af Amer: 109 mL/min/{1.73_m2} (ref >59)
GFR calc non Af Amer: 94 mL/min/{1.73_m2} (ref >59)
Globulin, Total: 2.5 g/dL (ref 1.5–4.5)
Glucose: 92 mg/dL (ref 65–99)
Potassium: 4.7 mmol/L (ref 3.5–5.2)
Sodium: 141 mmol/L (ref 134–144)
Total Protein: 6.8 g/dL (ref 6.0–8.5)

## 2020-02-15 LAB — LIPID PANEL
Chol/HDL Ratio: 5.5 ratio — ABNORMAL HIGH (ref 0.0–4.4)
Cholesterol, Total: 296 mg/dL — ABNORMAL HIGH (ref 100–199)
HDL: 54 mg/dL (ref >39)
LDL Chol Calc (NIH): 216 mg/dL — ABNORMAL HIGH (ref 0–99)
Triglycerides: 143 mg/dL (ref 0–149)
VLDL Cholesterol Cal: 26 mg/dL (ref 5–40)

## 2020-02-15 NOTE — Patient Instructions (Signed)

## 2020-02-18 ENCOUNTER — Telehealth: Payer: Self-pay

## 2020-02-18 DIAGNOSIS — I25119 Atherosclerotic heart disease of native coronary artery with unspecified angina pectoris: Secondary | ICD-10-CM

## 2020-02-18 DIAGNOSIS — E782 Mixed hyperlipidemia: Secondary | ICD-10-CM

## 2020-02-18 MED ORDER — BEMPEDOIC ACID 180 MG PO TABS: 180.0000 mg | ORAL_TABLET | Freq: Every day | ORAL | 3 refills | Status: DC

## 2020-02-18 NOTE — Telephone Encounter (Signed)
This note was from Dr. Bettina Gavia:   For Michelle King 1961/03/07 I reviewed the note from allergy start bempedoic acid 180 mg daily follow-up lipid profile in 6 weeks   Spoke with patient regarding results and recommendation.  Patient verbalizes understanding and is agreeable to plan of care. Advised patient to call back with any issues or concerns.

## 2020-02-18 NOTE — Telephone Encounter (Signed)
-----   Message from Richardo Priest, MD sent at 02/16/2020  9:49 AM EDT ----- Normal or stable result  Her lipids are severely elevated, unfortunately she has urticaria is on immune suppression with cyclosporine and I sent a note to an allergist that she is saying about whether she could be put on a PCSK9 inhibitor.  Her LDL cholesterol was severely elevated she has familial hyperlipidemia.  If unable to be on medication I Minna send her to see Dr. Debara Pickett is at times these individuals have plasmapheresis.  I suspect the allergist will allow [place] to initiate a PCSK9 inhibitor.

## 2020-02-25 ENCOUNTER — Other Ambulatory Visit: Payer: Self-pay | Admitting: Cardiology

## 2020-02-25 NOTE — Telephone Encounter (Signed)
Prior-authorization has been sent but can take 1-3 days for a response. I will continue to check on the status of this. Patient made aware and verbalizes understanding.    Encouraged patient to call back with any questions or concerns.

## 2020-02-25 NOTE — Telephone Encounter (Signed)
New message  Pt c/o medication issue:  1. Name of Medication: Bempedoic Acid 180 MG TABS  2. How are you currently taking this medication (dosage and times per day)? As directed  3. Are you having a reaction (difficulty breathing--STAT)? no  4. What is your medication issue? Patient states that pharmacy told her that medication requires a prior authorization. Please assist with a prior authorization and refill for medication.

## 2020-03-03 DIAGNOSIS — C3411 Malignant neoplasm of upper lobe, right bronchus or lung: Secondary | ICD-10-CM

## 2020-03-10 ENCOUNTER — Telehealth: Payer: Self-pay | Admitting: Cardiology

## 2020-03-10 NOTE — Telephone Encounter (Signed)
    Pt c/o medication issue:  1. Name of Medication: Lipid meds  2. How are you currently taking this medication (dosage and times per day)?   3. Are you having a reaction (difficulty breathing--STAT)?   4. What is your medication issue? Pt been waiting update for her lipid meds. She said its been 3 weeks and couldn't do the follow up with Dr. Bettina Gavia without starting the medication

## 2020-03-10 NOTE — Telephone Encounter (Signed)
Spoke to the patient just now and let her know that her prior authorization has been approved. She verbalizes understanding and thanks me for the call back.

## 2020-06-30 ENCOUNTER — Other Ambulatory Visit: Payer: Self-pay | Admitting: Oncology

## 2020-06-30 DIAGNOSIS — C3411 Malignant neoplasm of upper lobe, right bronchus or lung: Secondary | ICD-10-CM

## 2020-07-12 ENCOUNTER — Other Ambulatory Visit: Payer: Self-pay | Admitting: Hematology and Oncology

## 2020-07-12 DIAGNOSIS — C3411 Malignant neoplasm of upper lobe, right bronchus or lung: Secondary | ICD-10-CM

## 2020-07-19 ENCOUNTER — Other Ambulatory Visit: Payer: Self-pay | Admitting: Cardiology

## 2020-07-29 ENCOUNTER — Telehealth: Payer: Self-pay

## 2020-07-29 NOTE — Telephone Encounter (Signed)
PA approved from 07-16-20 until 07-15-21.

## 2020-07-29 NOTE — Telephone Encounter (Signed)
PA started on CMM for Nexletol. Key [id number]

## 2020-08-01 ENCOUNTER — Other Ambulatory Visit: Payer: Self-pay

## 2020-08-03 ENCOUNTER — Telehealth: Payer: Self-pay

## 2020-08-03 NOTE — Telephone Encounter (Signed)
   [facility] Pre-operative Risk Assessment    HEARTCARE STAFF: - Please ensure there is not already an duplicate clearance open for this procedure. - Under Visit Info/Reason for Call, type in Other and utilize the format Clearance MM/DD/YY or Clearance TBD. Do not use dashes or single digits. - If request is for dental extraction, please clarify the # of teeth to be extracted.  Request for surgical clearance:  1. What type of surgery is being performed? Colonoscopy  2. When is this surgery scheduled? 08/19/2020   3. What type of clearance is required (medical clearance vs. Pharmacy clearance to hold med vs. Both)? Both  4. Are there any medications that need to be held prior to surgery and how long? Plavix 08/14/2020   5. Practice name and name of physician performing surgery? [facility]   6. What is the office phone number? [phone]   7.   What is the office fax number? [phone]  8.   Anesthesia type (None, local, MAC, general) ? General   Gita Kudo 08/03/2020, 10:48 AM  _________________________________________________________________   (provider comments below)

## 2020-08-04 NOTE — Telephone Encounter (Signed)
   Primary Cardiologist: Shirlee More, MD  Chart reviewed as part of pre-operative protocol coverage. Miss Danique Hartsough has a hx of STEMI s/p PCI of prox LAD in 2010, HLD, COPD, lung cancer. She has been maintained on Plavix. Will route to her primary cardiologist for review.   Loel Dubonnet, NP 08/04/2020, 10:48 AM

## 2020-08-04 NOTE — Telephone Encounter (Signed)
   Primary Cardiologist: Shirlee More, MD  Chart reviewed as part of pre-operative protocol coverage. Given past medical history and time since last visit, based on ACC/AHA guidelines, Michelle King would be at acceptable risk for the planned procedure without further cardiovascular testing.   Per Dr. Bettina Gavia she may hold Plavix 7 days prior to the planned procedure. Recommend resuming 1-2 days afterward at the direction of her GI doctor.   The patient was advised that if she develops new symptoms prior to surgery to contact our office to arrange for a follow-up visit, and she verbalized understanding.  I will route this recommendation to the requesting party via Epic fax function and remove from pre-op pool.  Please call with questions.  Loel Dubonnet, NP 08/04/2020, 1:03 PM

## 2020-08-04 NOTE — Telephone Encounter (Signed)
Her CAD is stable she can withhold her clopidogrel 7 days prior and usually restart 1 to 2 days afterwards at the direction of her GI doctor.

## 2020-08-08 ENCOUNTER — Inpatient Hospital Stay: Payer: Medicare HMO | Attending: Oncology

## 2020-08-08 ENCOUNTER — Other Ambulatory Visit: Payer: Self-pay | Admitting: Hematology and Oncology

## 2020-08-08 ENCOUNTER — Other Ambulatory Visit: Payer: Self-pay

## 2020-08-08 DIAGNOSIS — C3411 Malignant neoplasm of upper lobe, right bronchus or lung: Secondary | ICD-10-CM

## 2020-08-08 LAB — HEPATIC FUNCTION PANEL
ALT: 36 — AB (ref 7–35)
AST: 40 — AB (ref 13–35)
Alkaline Phosphatase: 58 (ref 25–125)
Bilirubin, Total: 0.6

## 2020-08-08 LAB — CBC AND DIFFERENTIAL
HCT: 36 (ref 36–46)
Hemoglobin: 12.3 (ref 12.0–16.0)
Neutrophils Absolute: 1.37
Platelets: 226 (ref 150–399)
WBC: 2.4

## 2020-08-08 LAB — BASIC METABOLIC PANEL
BUN: 16 (ref 4–21)
CO2: 30 — AB (ref 13–22)
Chloride: 107 (ref 99–108)
Creatinine: 0.5 (ref 0.5–1.1)
Glucose: 102
Potassium: 3.6 (ref 3.4–5.3)
Sodium: 141 (ref 137–147)

## 2020-08-08 LAB — COMPREHENSIVE METABOLIC PANEL
Albumin: 3.7 (ref 3.5–5.0)
Calcium: 9 (ref 8.7–10.7)

## 2020-08-08 LAB — CBC: RBC: 4.2 (ref 3.87–5.11)

## 2020-08-11 LAB — CYCLOSPORINE: Cyclosporine, LabCorp: 45 ng/mL — ABNORMAL LOW (ref 100–400)

## 2020-08-12 DIAGNOSIS — M1990 Unspecified osteoarthritis, unspecified site: Secondary | ICD-10-CM | POA: Insufficient documentation

## 2020-08-12 DIAGNOSIS — C801 Malignant (primary) neoplasm, unspecified: Secondary | ICD-10-CM | POA: Insufficient documentation

## 2020-08-12 DIAGNOSIS — I2510 Atherosclerotic heart disease of native coronary artery without angina pectoris: Secondary | ICD-10-CM | POA: Insufficient documentation

## 2020-08-12 DIAGNOSIS — T8859XA Other complications of anesthesia, initial encounter: Secondary | ICD-10-CM | POA: Insufficient documentation

## 2020-08-12 DIAGNOSIS — R112 Nausea with vomiting, unspecified: Secondary | ICD-10-CM | POA: Insufficient documentation

## 2020-08-12 DIAGNOSIS — J189 Pneumonia, unspecified organism: Secondary | ICD-10-CM | POA: Insufficient documentation

## 2020-08-13 ENCOUNTER — Encounter: Payer: Self-pay | Admitting: Oncology

## 2020-08-16 NOTE — Progress Notes (Signed)
Michelle King  [address] [place],  [place]  [address] [phone]  Clinic Day:  08/17/2020  Referring physician: Charlotte Sanes, MD   HISTORY OF PRESENT ILLNESS:  The patient is a 60 y.o. female with mediastinal and right supraclavicular nodal recurrence of her previous lung adenocarcinoma.  She did undergo chemoradiation before completing 1 year of maintenance durvalumab in March 2021.  She comes in today to go over her most recent chest CT as part of her continued radiographic lung cancer surveillance.  Since her last visit, the patient has been doing okay.  She continues to have an intermittently dry cough that has not improved over time.  Otherwise, she denies having progressive shortness of breath, hemoptysis, or any other respiratory symptoms which concern her for possible disease progression.   PHYSICAL EXAM:  Blood pressure 120/75, pulse 84, temperature 98.3 F (36.8 C), resp. rate 14, height 5\' 2"  (1.575 m), weight 131 lb 14.4 oz (59.8 kg), SpO2 97 %. Wt Readings from Last 3 Encounters:  08/17/20 131 lb 14.4 oz (59.8 kg)  08/08/20 131 lb (59.4 kg)  02/15/20 135 lb (61.2 kg)   Body mass index is 24.12 kg/m. Performance status (ECOG): 1 - Symptomatic but completely ambulatory Physical Exam Constitutional:      Appearance: Normal appearance. She is not ill-appearing.  HENT:     Mouth/Throat:     Mouth: Mucous membranes are moist.     Pharynx: Oropharynx is clear. No oropharyngeal exudate or posterior oropharyngeal erythema.  Cardiovascular:     Rate and Rhythm: Normal rate and regular rhythm.     Heart sounds: No murmur heard. No friction rub. No gallop.   Pulmonary:     Effort: Pulmonary effort is normal. No respiratory distress.     Breath sounds: Normal breath sounds. No wheezing, rhonchi or rales.  Chest:  Breasts:     Right: No axillary adenopathy or supraclavicular adenopathy.     Left: No axillary adenopathy or supraclavicular  adenopathy.    Abdominal:     General: Bowel sounds are normal. There is no distension.     Palpations: Abdomen is soft. There is no mass.     Tenderness: There is no abdominal tenderness.  Musculoskeletal:        General: No swelling.     Right lower leg: No edema.     Left lower leg: No edema.  Lymphadenopathy:     Cervical: No cervical adenopathy.     Upper Body:     Right upper body: No supraclavicular or axillary adenopathy.     Left upper body: No supraclavicular or axillary adenopathy.     Lower Body: No right inguinal adenopathy. No left inguinal adenopathy.  Skin:    General: Skin is warm.     Coloration: Skin is not jaundiced.     Findings: No lesion or rash.  Neurological:     General: No focal deficit present.     Mental Status: She is alert and oriented to person, place, and time. Mental status is at baseline.     Cranial Nerves: Cranial nerves are intact.  Psychiatric:        Mood and Affect: Mood normal.        Behavior: Behavior normal.        Thought Content: Thought content normal.    LABS:   CBC Latest Ref Rng & Units 08/08/2020 04/17/2018 08/10/2016  WBC - 2.4 6.7 7.6  Hemoglobin 12.0 - 16.0 12.3  14.7 10.2(L)  Hematocrit 36 - 46 36 46.3(H) 33.6(L)  Platelets 150 - 399 226 337 413(H)   CMP Latest Ref Rng & Units 08/08/2020 02/15/2020 04/17/2018  Glucose 65 - 99 mg/dL - 92 102(H)  BUN 4 - 21 16 16 11   Creatinine 0.5 - 1.1 0.5 0.70 0.57  Sodium 137 - 147 141 141 138  Potassium 3.4 - 5.3 3.6 4.7 4.0  Chloride 99 - 108 107 103 104  CO2 13 - 22 30(A) 23 26  Calcium 8.7 - 10.7 9.0 9.6 9.4  Total Protein 6.0 - 8.5 g/dL - 6.8 7.2  Total Bilirubin 0.0 - 1.2 mg/dL - 0.5 0.7  Alkaline Phos 25 - 125 58 93 84  AST 13 - 35 40(A) 13 19  ALT 7 - 35 36(A) 7 19       STUDIES:  Her chest CT revealed the following: FINDINGS: Cardiovascular: Calcified and noncalcified atheromatous plaque in the thoracic aorta. No sign of aneurysmal dilation of the thoracic aorta.  Central pulmonary vasculature is normal caliber, limited assessment on venous phase. Calcified coronary artery disease and signs of percutaneous coronary intervention as before. No pericardial effusion. Normal heart size.  Mediastinum/Nodes: RIGHT-sided Port-A-Cath terminates in the upper RIGHT atrium as before.  Soft tissue in the mediastinum along RIGHT paratracheal chain and RIGHT mainstem bronchus with similar appearance. No discrete adenopathy in the chest.  Lungs/Pleura: Signs of partial lung resection, RIGHT upper lobectomy and post treatment changes in the RIGHT chest extending into the RIGHT lung apex and along the RIGHT mediastinal border from the RIGHT hilum without change. Distortion of RIGHT hilar structures resulting from post treatment changes as before.  Background pulmonary emphysema.  Nodular foci along the LEFT hemidiaphragm in the LEFT lower lobe, best seen on image 86 of series 4. Three discrete areas along the LEFT hemidiaphragm on this image and another small area of ground-glass on image 82 of series 4. These areas measure between 3 and 7 mm. Mild scarring in the lingula is similar to the previous study.  Upper Abdomen: Post cholecystectomy. Imaged portions of liver, pancreas, spleen, adrenal glands and kidneys are unremarkable. No acute upper abdominal process.  Musculoskeletal: No acute musculoskeletal process. Spinal degenerative changes.  IMPRESSION: 1. Post treatment changes in the RIGHT chest without interval change. 2. Scattered nodular foci in the LEFT lung base along the LEFT hemidiaphragm, findings are of uncertain significance, potentially infectious or inflammatory, close attention on follow-up, consider short interval follow-up for further evaluation. 3. Background pulmonary emphysema. 4. Calcified coronary artery disease and signs of percutaneous coronary intervention as before. 5. Emphysema and aortic atherosclerosis.  Aortic  Atherosclerosis (ICD10-I70.0) and Emphysema (ICD10-J43.9).   ASSESSMENT & PLAN:  Assessment/Plan:  A 60 y.o. female with mediastinal and right supraclavicular nodal recurrence of her lung adenocarcinoma, who completed all of her definitive therapy in March 2021.  In clinic today, I went over her chest CT images with her, for which she could see she continues to show no evidence of disease recurrence.  Understandably, the patient was pleased with her scans.  With respect to her persistent cough, which I believe is due to radiation-induced lung changes, I will have this patient see pulmonology to see if they can give recommendations on how to treat this problem.   Otherwise, I will see this patient back in 4 months for repeat clinical assessment.  A chest CT will be done a day before her next visit for her continued radiographic lung cancer surveillance.  The patient understands all the plans discussed today and is in agreement with them.      Julicia Krieger Macarthur Critchley, MD

## 2020-08-17 ENCOUNTER — Inpatient Hospital Stay: Payer: Medicare HMO | Attending: Oncology | Admitting: Oncology

## 2020-08-17 ENCOUNTER — Telehealth: Payer: Self-pay | Admitting: Oncology

## 2020-08-17 ENCOUNTER — Other Ambulatory Visit: Payer: Self-pay

## 2020-08-17 ENCOUNTER — Other Ambulatory Visit: Payer: Self-pay | Admitting: Oncology

## 2020-08-17 VITALS — BP 120/75 | HR 84 | Temp 98.3°F | Resp 14 | Ht 62.0 in | Wt 131.9 lb

## 2020-08-17 DIAGNOSIS — C3411 Malignant neoplasm of upper lobe, right bronchus or lung: Secondary | ICD-10-CM | POA: Diagnosis not present

## 2020-08-17 MED ORDER — BENZONATATE 100 MG PO CAPS: 100.0000 mg | ORAL_CAPSULE | Freq: Three times a day (TID) | ORAL | 5 refills | Status: DC | PRN

## 2020-08-17 NOTE — Telephone Encounter (Signed)
Per 2/2 LOS, patient to be scheduled for 6/1.  Gave patient Appt Summary

## 2020-08-26 ENCOUNTER — Encounter: Payer: Self-pay | Admitting: Gastroenterology

## 2020-08-28 NOTE — Progress Notes (Signed)
Cardiology Office Note:    Date:  08/29/2020   ID:  Michelle King, DOB 09-19-1960, MRN [id number]  PCP:  Charlotte Sanes, MD  Cardiologist:  Shirlee More, MD    Referring MD: Charlotte Sanes, MD    ASSESSMENT:    1. Coronary artery disease involving native coronary artery of native heart with angina pectoris ([place])   2. Mixed hyperlipidemia   3. Statin intolerance   4. Chronic obstructive pulmonary disease, unspecified COPD type ([place])   5. Malignant neoplasm of upper lobe of right lung (HCC)    PLAN:    In order of problems listed above:  1. Stable CAD having no angina on current treatment continue clopidogrel and been but awake acid as she is statin intolerant.  Recheck lipids today. 2. Stable COPD doing well on current treatment bronchodilators 3. Stable no evidence of recurrence   Next appointment: 1 year   Medication Adjustments/Labs and Tests Ordered: Current medicines are reviewed at length with the patient today.  Concerns regarding medicines are outlined above.  Orders Placed This Encounter  Procedures  . Lipid panel   No orders of the defined types were placed in this encounter.   Chief Complaint  Patient presents with  . Follow-up  . Coronary Artery Disease  . Hyperlipidemia    History of Present Illness:    Michelle King is a 60 y.o. female with a hx of CAD with STEMI PCI and stent to proximal LAD 2010 cardiomyopathy with recovery of ejection fraction dyslipidemia statin intolerant COPD lung cancer and chronic urticaria.  She was last seen 02/15/2020.  She was seen by oncology [phone] for her adenocarcinoma of the lung with recurrence treated with chemo radiation with no evidence of recurrent disease. Compliance with diet, lifestyle and medications: Yes  She tolerates bempedoic acid and needs to have lipids drawn today.  No muscle pain or weakness she has a chronic cough related to her pulmonary disease cancer and radiation  but no shortness of breath edema palpitation or syncope. Past Medical History:  Diagnosis Date  . Angio-edema 09/05/2016  . Arthritis    spine  . CAD (coronary artery disease)   . Cancer ([place])    lung  . Chronic idiopathic urticaria 09/05/2016  . Complication of anesthesia   . COPD (chronic obstructive pulmonary disease) ([place])   . Coronary artery disease involving native coronary artery of native heart with angina pectoris [facility])    Overview:  Anterior MI 2010 with PCI and stent Her last stress test pharmacologic showed normal ejection fraction and no evidence of ischemia. She has a history of previous LAD infarction with severely depressed ejection fraction that normalized with medical therapy and revascularization percutaneously.  . Family history of colon cancer   . Hematochezia 04/14/2015  . History of bilateral tubal ligation   . Hyperlipidemia   . Lung cancer-right upper lobe 11/15/2014  . Myocardial infarction ([place]) 2010  . Obstructive chronic bronchitis without exacerbation ([place]) 03/01/2015  . Pneumonia    hx  . PONV (postoperative nausea and vomiting)   . Preoperative cardiovascular examination 03/02/2015  . 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 Pure hypercholesterolemia 03/01/2015  . S/P hysterectomy   . S/P lobectomy of lung 03/25/2015  . Shortness of breath dyspnea     Past Surgical History:  Procedure Laterality Date  . APPENDECTOMY    . CARDIAC CATHETERIZATION     stents  . COLONOSCOPY N/A 04/18/2015   Procedure: COLONOSCOPY;  Surgeon: Norberto Sorenson T  Fuller Plan, MD;  Location: [facility];  Service: Endoscopy;  Laterality: N/A;  . MOUTH SURGERY     teeth  . TUBAL LIGATION    . VIDEO ASSISTED THORACOSCOPY (VATS)/WEDGE RESECTION Right 03/25/2015   Procedure: VIDEO ASSISTED THORACOSCOPY (VATS)/LUNG RESECTION;  Surgeon: Grace Isaac, MD;  Location: [place];  Service: Thoracic;  Laterality: Right;  Marland Kitchen VIDEO BRONCHOSCOPY N/A 03/25/2015   Procedure: VIDEO BRONCHOSCOPY;  Surgeon: Grace Isaac, MD;  Location: [facility];  Service: Thoracic;  Laterality: N/A;  . VIDEO BRONCHOSCOPY WITH ENDOBRONCHIAL ULTRASOUND N/A 04/18/2018   Procedure: VIDEO BRONCHOSCOPY WITH ENDOBRONCHIAL ULTRASOUND;  Surgeon: Grace Isaac, MD;  Location[facility];  Service: Thoracic;  Laterality: N/A;  . VIDEO BRONCHOSCOPY WITH INSERTION OF INTERBRONCHIAL VALVE (IBV) N/A 04/12/2015   Procedure: VIDEO BRONCHOSCOPY WITH INSERTION OF 7MM INTERBRONCHIAL VALVES (IBV) IN SUPERIOR SEGMENT, MEDIAL AND POSTERIOR SEGMENT, AND LATERAL SEGMENT OF RIGHT LOWER LOBE;  Surgeon: Grace Isaac, MD;  Location: [facility];  Service: Thoracic;  Laterality: N/A;  62mm Spiration Valve placed in Superior Segment, Medial and Posterior Segment, and Lateral Segment of Right Lower Lobe  . VIDEO BRONCHOSCOPY WITH INSERTION OF INTERBRONCHIAL VALVE (IBV) Right 04/19/2015   Procedure: VIDEO BRONCHOSCOPY WITH INSERTION OF INTERBRONCHIAL VALVE (IBV);  Surgeon: Grace Isaac, MD;  Location: [facility];  Service: Thoracic;  Laterality: Right;  Marland Kitchen VIDEO BRONCHOSCOPY WITH INSERTION OF INTERBRONCHIAL VALVE (IBV) N/A 06/03/2015   Procedure: VIDEO BRONCHOSCOPY WITH REMOVAL OF INTERBRONCHIAL VALVE (IBV);  Surgeon: Grace Isaac, MD;  Location: [facility] OR;  Service: Thoracic;  Laterality: N/A;    Current Medications: Current Meds  Medication Sig  . albuterol (PROVENTIL HFA;VENTOLIN HFA) 108 (90 Base) MCG/ACT inhaler Inhale 2 puffs into the lungs every 4 (four) hours as needed for wheezing or shortness of breath.   . benzonatate (TESSALON) 100 MG capsule Take 1 capsule (100 mg total) by mouth 3 (three) times daily as needed for cough.  Marland Kitchen BREO ELLIPTA 100-25 MCG/INH AEPB 1 puff as needed.  . cetirizine (ZYRTEC) 10 MG tablet Take 10 mg by mouth daily.  . clopidogrel (PLAVIX) 75 MG tablet Take 1 tablet by mouth once daily  . cyclobenzaprine (FLEXERIL) 10 MG tablet Take 10 mg by mouth 3 (three) times daily as needed.  . cycloSPORINE modified (NEORAL) 50 MG capsule  50 mg daily.  Marland Kitchen gabapentin (NEURONTIN) 100 MG capsule Take 100 mg by mouth 2 (two) times daily.   Marland Kitchen ibuprofen (ADVIL) 600 MG tablet Take 600 mg by mouth 3 (three) times daily as needed.  Marland Kitchen NEXLETOL 180 MG TABS Take 1 tablet by mouth once daily  . SYMBICORT 160-4.5 MCG/ACT inhaler Inhale 2 puffs into the lungs as needed.   . traMADol (ULTRAM) 50 MG tablet Take 50 mg by mouth 2 (two) times daily.   Current Facility-Administered Medications for the 08/29/20 encounter (Office Visit) with Richardo Priest, MD  Medication  . omalizumab Arvid Right) injection 300 mg     Allergies:   Codeine, Rosuvastatin, and Sympathomimetics   Social History   Socioeconomic History  . Marital status: Married    Spouse name: Not on file  . Number of children: Not on file  . Years of education: Not on file  . Highest education level: Not on file  Occupational History  . Not on file  Tobacco Use  . Smoking status: Former Smoker    Packs/day: 0.50    Years: 40.00    Pack years: 20.00    Types:  Cigarettes    Quit date: 03/25/2015    Years since quitting: 5.4  . Smokeless tobacco: Never Used  . Tobacco comment: down to 4 aday  Vaping Use  . Vaping Use: Never used  Substance and Sexual Activity  . Alcohol use: No    Alcohol/week: 0.0 standard drinks  . Drug use: No  . Sexual activity: Not on file  Other Topics Concern  . Not on file  Social History Narrative  . Not on file   Social Determinants of Health   Financial Resource Strain: Not on file  Food Insecurity: Not on file  Transportation Needs: Not on file  Physical Activity: Not on file  Stress: Not on file  Social Connections: Not on file     Family History: The patient's family history includes Cancer in her brother, father, and sister; Cerebrovascular Disease in her brother and brother; Diabetes in her mother; Heart disease in her brother and brother; Stroke in her sister and sister. ROS:   Please see the history of present illness.     All other systems reviewed and are negative.  EKGs/Labs/Other Studies Reviewed:    The following studies were reviewed today:   Recent Labs: 08/08/2020: ALT 36; BUN 16; Creatinine 0.5; Hemoglobin 12.3; Platelets 226; Potassium 3.6; Sodium 141  Recent Lipid Panel    Component Value Date/Time   CHOL 296 (H) 02/15/2020 0952   TRIG 143 02/15/2020 0952   HDL 54 02/15/2020 0952   CHOLHDL 5.5 (H) 02/15/2020 0952   CHOLHDL 5.3 04/19/2009 1130   VLDL 23 04/19/2009 1130   LDLCALC 216 (H) 02/15/2020 0952    Physical Exam:    VS:  BP 124/60 (BP Location: Right Arm, Patient Position: Sitting, Cuff Size: Normal)   Pulse 91   Ht 5\' 2"  (1.575 m)   Wt 132 lb (59.9 kg)   SpO2 98%   BMI 24.14 kg/m     Wt Readings from Last 3 Encounters:  08/29/20 132 lb (59.9 kg)  08/17/20 131 lb 14.4 oz (59.8 kg)  08/08/20 131 lb (59.4 kg)     GEN:  Well nourished, well developed in no acute distress HEENT: Normal NECK: No JVD; No carotid bruits LYMPHATICS: No lymphadenopathy CARDIAC: RRR, no murmurs, rubs, gallops RESPIRATORY:  Clear to auscultation without rales, wheezing or rhonchi  ABDOMEN: Soft, non-tender, non-distended MUSCULOSKELETAL:  No edema; No deformity  SKIN: Warm and dry NEUROLOGIC:  Alert and oriented x 3 PSYCHIATRIC:  Normal affect    Signed, Shirlee More, MD  08/29/2020 8:55 AM    [place]

## 2020-08-29 ENCOUNTER — Encounter: Payer: Self-pay | Admitting: Cardiology

## 2020-08-29 ENCOUNTER — Ambulatory Visit (INDEPENDENT_AMBULATORY_CARE_PROVIDER_SITE_OTHER): Payer: Medicare HMO | Admitting: Cardiology

## 2020-08-29 ENCOUNTER — Other Ambulatory Visit: Payer: Self-pay

## 2020-08-29 VITALS — BP 124/60 | HR 91 | Ht 62.0 in | Wt 132.0 lb

## 2020-08-29 DIAGNOSIS — Z789 Other specified health status: Secondary | ICD-10-CM

## 2020-08-29 DIAGNOSIS — J449 Chronic obstructive pulmonary disease, unspecified: Secondary | ICD-10-CM

## 2020-08-29 DIAGNOSIS — C3411 Malignant neoplasm of upper lobe, right bronchus or lung: Secondary | ICD-10-CM

## 2020-08-29 DIAGNOSIS — E782 Mixed hyperlipidemia: Secondary | ICD-10-CM | POA: Diagnosis not present

## 2020-08-29 DIAGNOSIS — I25119 Atherosclerotic heart disease of native coronary artery with unspecified angina pectoris: Secondary | ICD-10-CM

## 2020-08-29 NOTE — Patient Instructions (Signed)
Medication Instructions:  Your physician recommends that you continue on your current medications as directed. Please refer to the Current Medication list given to you today.  *If you need a refill on your cardiac medications before your next appointment, please call your pharmacy*   Lab Work: None If you have labs (blood work) drawn today and your tests are completely normal, you will receive your results only by: Marland Kitchen MyChart Message (if you have MyChart) OR . A paper copy in the mail If you have any lab test that is abnormal or we need to change your treatment, we will call you to review the results.   Testing/Procedures: None   Follow-Up: At [facility], you and your health needs are our priority.  As part of our continuing mission to provide you with exceptional heart care, we have created designated Provider Care Teams.  These Care Teams include your primary Cardiologist (physician) and Advanced Practice Providers (APPs -  Physician Assistants and Nurse Practitioners) who all work together to provide you with the care you need, when you need it.  We recommend signing up for the patient portal called "MyChart".  Sign up information is provided on this After Visit Summary.  MyChart is used to connect with patients for Virtual Visits (Telemedicine).  Patients are able to view lab/test results, encounter notes, upcoming appointments, etc.  Non-urgent messages can be sent to your provider as well.   To learn more about what you can do with MyChart, go to [email or web address].    Your next appointment:   9 month(s)  The format for your next appointment:   In Person  Provider:   Shirlee More, MD   Other Instructions

## 2020-08-30 ENCOUNTER — Telehealth: Payer: Self-pay

## 2020-08-30 LAB — LIPID PANEL
Chol/HDL Ratio: 3.7 ratio (ref 0.0–4.4)
Cholesterol, Total: 161 mg/dL (ref 100–199)
HDL: 44 mg/dL (ref >39)
LDL Chol Calc (NIH): 96 mg/dL (ref 0–99)
Triglycerides: 114 mg/dL (ref 0–149)
VLDL Cholesterol Cal: 21 mg/dL (ref 5–40)

## 2020-08-30 NOTE — Telephone Encounter (Signed)
Spoke with patient regarding results and recommendation.  Patient verbalizes understanding and is agreeable to plan of care. Advised patient to call back with any issues or concerns.  

## 2020-08-30 NOTE — Telephone Encounter (Signed)
-----   Message from Richardo Priest, MD sent at 08/30/2020  7:32 AM EST ----- Her LDL cholesterol remains severely elevated and bempedoic acid  I feel strongly she should be taking Repatha refer to Pharm.D. along with bempedoic acid

## 2020-12-15 ENCOUNTER — Other Ambulatory Visit: Payer: Self-pay

## 2020-12-15 ENCOUNTER — Telehealth: Payer: Self-pay | Admitting: Oncology

## 2020-12-15 ENCOUNTER — Inpatient Hospital Stay: Payer: Medicare HMO | Admitting: Oncology

## 2020-12-15 NOTE — Telephone Encounter (Signed)
Scheduled patient for 6/7 Labs, [place] - at 9:00 am [facility] - then she will have CT Chest at the [facility] 10:00 am  Follow Up w/Dr Bobby Rumpf scheduled for 6/8 9:00 am  Patient Aware of the Appt

## 2020-12-19 NOTE — Progress Notes (Signed)
[facility]  [address] [place],    [address] [phone]  Clinic Day:  12/21/2020  Referring physician: Charlotte Sanes, MD  This document serves as a record of services personally performed by Marice Potter, MD. It was created on their behalf by Curry,Lauren E, a trained medical scribe. The creation of this record is based on the scribe's personal observations and the provider's statements to them.  HISTORY OF PRESENT ILLNESS:  The patient is a 60 y.o. female with mediastinal and right supraclavicular nodal recurrence of her previous lung adenocarcinoma.  She did undergo chemoradiation before completing 1 year of maintenance durvalumab in March 2021.  She comes in today to go over her most recent chest CT as part of her continued radiographic lung cancer surveillance.  Since her last visit, the patient has been doing okay.  She claims her coughing has gotten much better.  She denies having progressive shortness of breath, hemoptysis, or any other respiratory symptoms which concern her for possible disease progression.   PHYSICAL EXAM:  Blood pressure 123/75, pulse 82, temperature 98.1 F (36.7 C), resp. rate 16, height 5\' 2"  (1.575 m), weight 134 lb 9.6 oz (61.1 kg), SpO2 99 %. Wt Readings from Last 3 Encounters:  12/21/20 134 lb 9.6 oz (61.1 kg)  08/29/20 132 lb (59.9 kg)  08/17/20 131 lb 14.4 oz (59.8 kg)   Body mass index is 24.62 kg/m. Performance status (ECOG): 1 - Symptomatic but completely ambulatory Physical Exam Constitutional:      Appearance: Normal appearance. She is not ill-appearing.  HENT:     Mouth/Throat:     Mouth: Mucous membranes are moist.     Pharynx: Oropharynx is clear. No oropharyngeal exudate or posterior oropharyngeal erythema.  Cardiovascular:     Rate and Rhythm: Normal rate and regular rhythm.     Heart sounds: No murmur heard. No friction rub. No gallop.   Pulmonary:     Effort: Pulmonary effort is  normal. No respiratory distress.     Breath sounds: Normal breath sounds. No wheezing, rhonchi or rales.  Chest:  Breasts:     Right: No axillary adenopathy or supraclavicular adenopathy.     Left: No axillary adenopathy or supraclavicular adenopathy.    Abdominal:     General: Bowel sounds are normal. There is no distension.     Palpations: Abdomen is soft. There is no mass.     Tenderness: There is no abdominal tenderness.  Musculoskeletal:        General: No swelling.     Right lower leg: No edema.     Left lower leg: No edema.  Lymphadenopathy:     Cervical: No cervical adenopathy.     Upper Body:     Right upper body: No supraclavicular or axillary adenopathy.     Left upper body: No supraclavicular or axillary adenopathy.     Lower Body: No right inguinal adenopathy. No left inguinal adenopathy.  Skin:    General: Skin is warm.     Coloration: Skin is not jaundiced.     Findings: No lesion or rash.  Neurological:     General: No focal deficit present.     Mental Status: She is alert and oriented to person, place, and time. Mental status is at baseline.     Cranial Nerves: Cranial nerves are intact.  Psychiatric:        Mood and Affect: Mood normal.        Behavior:  Behavior normal.        Thought Content: Thought content normal.    LABS:   CBC Latest Ref Rng & Units 12/20/2020 08/08/2020 04/17/2018  WBC - 5.5 2.4 6.7  Hemoglobin 12.0 - 16.0 13.1 12.3 14.7  Hematocrit 36 - 46 39 36 46.3(H)  Platelets 150 - 399 292 226 337   CMP Latest Ref Rng & Units 12/20/2020 08/08/2020 02/15/2020  Glucose 65 - 99 mg/dL - - 92  BUN 4 - 21 13 16 16   Creatinine 0.5 - 1.1 0.5 0.5 0.70  Sodium 137 - 147 140 141 141  Potassium 3.4 - 5.3 4.1 3.6 4.7  Chloride 99 - 108 106 107 103  CO2 13 - 22 29(A) 30(A) 23  Calcium 8.7 - 10.7 9.1 9.0 9.6  Total Protein 6.0 - 8.5 g/dL - - 6.8  Total Bilirubin 0.0 - 1.2 mg/dL - - 0.5  Alkaline Phos 25 - 125 66 58 93  AST 13 - 35 22 40(A) 13  ALT 7 - 35  14 36(A) 7    STUDIES:  Her chest CT revealed the following: FINDINGS: Cardiovascular: The heart size is normal. No substantial pericardial effusion. Coronary artery calcification is evident. Atherosclerotic calcification is noted in the wall of the thoracic aorta.  Mediastinum/Nodes: Right Port-A-Cath tip is positioned in the distal SVC. Upper normal mediastinal lymph nodes are stable. No left hilar lymphadenopathy. No evidence for lymphadenopathy in the right hilum although right suprahilar region is obscured by post treatment change. There is no axillary lymphadenopathy. The esophagus has normal imaging features.  Lungs/Pleura: Post radiation scarring seen in the right apex and suprahilar aspect of the medial right lung. Surgical changes are consistent with right middle lobectomy. Underlying centrilobular emphysema noted.  Nodular foci seen previously at the left lung base along the hemidiaphragm have resolved in the interval. No new suspicious pulmonary nodule or mass. No focal airspace consolidation. No pleural effusion.  Upper Abdomen: Tiny hypodensity an the posterior left liver (97/2) is stable.  Musculoskeletal: No worrisome lytic or sclerotic osseous abnormality.  IMPRESSION: 1. No new or progressive findings to suggest recurrent or metastatic disease. 2. Interval resolution of the nodular foci seen previously at the left lung base along the hemidiaphragm. 3. Aortic Atherosclerosis (ICD10-I70.0) and Emphysema (ICD10-J43.9).  ASSESSMENT & PLAN:  Assessment/Plan:  A 60 y.o. female with mediastinal and right supraclavicular nodal recurrence of her lung adenocarcinoma, who completed all of her definitive therapy in March 2021.  In clinic today, I went over her chest CT images with her, for which she could see she continues to show no evidence of disease recurrence.  Understandably, the patient was pleased with her scans.  Clinically, the patient is doing very well.  I  will see this patient back in 4 months for repeat clinical assessment.  A chest x-ray will be done on the day of her next visit for her continued radiographic lung cancer surveillance.  The patient understands all the plans discussed today and is in agreement with them.     I, Rita Ohara, am acting as scribe for Marice Potter, MD    I have reviewed this report as typed by the medical scribe, and it is complete and accurate.  Dequincy Macarthur Critchley, MD

## 2020-12-20 ENCOUNTER — Other Ambulatory Visit: Payer: Self-pay

## 2020-12-20 ENCOUNTER — Encounter: Payer: Self-pay | Admitting: Hematology and Oncology

## 2020-12-20 ENCOUNTER — Inpatient Hospital Stay: Payer: Medicare HMO | Attending: Oncology

## 2020-12-20 DIAGNOSIS — C3411 Malignant neoplasm of upper lobe, right bronchus or lung: Secondary | ICD-10-CM

## 2020-12-20 LAB — BASIC METABOLIC PANEL
BUN: 13 (ref 4–21)
CO2: 29 — AB (ref 13–22)
Chloride: 106 (ref 99–108)
Creatinine: 0.5 (ref 0.5–1.1)
Glucose: 98
Potassium: 4.1 (ref 3.4–5.3)
Sodium: 140 (ref 137–147)

## 2020-12-20 LAB — CBC AND DIFFERENTIAL
HCT: 39 (ref 36–46)
Hemoglobin: 13.1 (ref 12.0–16.0)
Neutrophils Absolute: 3.91
Platelets: 292 (ref 150–399)
WBC: 5.5

## 2020-12-20 LAB — COMPREHENSIVE METABOLIC PANEL
Albumin: 4.2 (ref 3.5–5.0)
Calcium: 9.1 (ref 8.7–10.7)

## 2020-12-20 LAB — CBC: RBC: 4.51 (ref 3.87–5.11)

## 2020-12-20 LAB — HEPATIC FUNCTION PANEL
ALT: 14 (ref 7–35)
AST: 22 (ref 13–35)
Alkaline Phosphatase: 66 (ref 25–125)
Bilirubin, Total: 0.7

## 2020-12-21 ENCOUNTER — Inpatient Hospital Stay (INDEPENDENT_AMBULATORY_CARE_PROVIDER_SITE_OTHER): Payer: Medicare HMO | Admitting: Oncology

## 2020-12-21 ENCOUNTER — Telehealth: Payer: Self-pay | Admitting: Oncology

## 2020-12-21 ENCOUNTER — Other Ambulatory Visit: Payer: Self-pay | Admitting: Oncology

## 2020-12-21 VITALS — BP 123/75 | HR 82 | Temp 98.1°F | Resp 16 | Ht 62.0 in | Wt 134.6 lb

## 2020-12-21 DIAGNOSIS — C3411 Malignant neoplasm of upper lobe, right bronchus or lung: Secondary | ICD-10-CM | POA: Diagnosis not present

## 2020-12-21 NOTE — Telephone Encounter (Signed)
Per 6/8 los next appt scheduled and given to patient

## 2020-12-22 ENCOUNTER — Encounter: Payer: Self-pay | Admitting: Oncology

## 2021-01-01 ENCOUNTER — Other Ambulatory Visit: Payer: Self-pay | Admitting: Cardiology

## 2021-01-02 NOTE — Telephone Encounter (Signed)
Refill sent to pharmacy.   

## 2021-02-26 DIAGNOSIS — N904 Leukoplakia of vulva: Secondary | ICD-10-CM

## 2021-02-26 DIAGNOSIS — L2084 Intrinsic (allergic) eczema: Secondary | ICD-10-CM

## 2021-02-26 DIAGNOSIS — L299 Pruritus, unspecified: Secondary | ICD-10-CM

## 2021-02-26 HISTORY — DX: Pruritus, unspecified: L29.9

## 2021-02-26 HISTORY — DX: Intrinsic (allergic) eczema: L20.84

## 2021-02-26 HISTORY — DX: Leukoplakia of vulva: N90.4

## 2021-04-13 ENCOUNTER — Other Ambulatory Visit: Payer: Self-pay | Admitting: Cardiology

## 2021-04-19 NOTE — Progress Notes (Signed)
[facility]  [address] [place],  [place]  [address] [phone]  Clinic Day:  04/25/2021  Referring physician: Charlotte Sanes, MD  This document serves as a record of services personally performed by Marice Potter, MD. It was created on their behalf by Curry,Lauren E, a trained medical scribe. The creation of this record is based on the scribe's personal observations and the provider's statements to them.  HISTORY OF PRESENT ILLNESS:  The patient is a 60 y.o. female with mediastinal and right supraclavicular nodal recurrence of her previous lung adenocarcinoma.  She did undergo chemoradiation before completing 1 year of maintenance durvalumab in March 2021.  She comes in today to review her chest x-ray done today as part of her continued radiographic lung cancer surveillance.  Since her last visit, the patient has been doing okay.  She claims her coughing has become more prominent.  This has been an issue ever since she completed the chemoradiation component of her recurrent lung cancer.  She denies having progressive shortness of breath, hemoptysis, or any other respiratory symptoms which concern her for possible disease progression.   PHYSICAL EXAM:  Blood pressure (!) 146/81, pulse 83, temperature 98.2 F (36.8 C), resp. rate 16, height 5\' 2"  (1.575 m), weight 135 lb 6.4 oz (61.4 kg), SpO2 97 %. Wt Readings from Last 3 Encounters:  04/25/21 135 lb 6.4 oz (61.4 kg)  12/21/20 134 lb 9.6 oz (61.1 kg)  08/29/20 132 lb (59.9 kg)   Body mass index is 24.76 kg/m. Performance status (ECOG): 1 - Symptomatic but completely ambulatory Physical Exam Constitutional:      Appearance: Normal appearance. She is not ill-appearing.  HENT:     Mouth/Throat:     Mouth: Mucous membranes are moist.     Pharynx: Oropharynx is clear. No oropharyngeal exudate or posterior oropharyngeal erythema.  Cardiovascular:     Rate and Rhythm: Normal rate and regular rhythm.      Heart sounds: No murmur heard.   No friction rub. No gallop.  Pulmonary:     Effort: Pulmonary effort is normal. No respiratory distress.     Breath sounds: Normal breath sounds. No wheezing, rhonchi or rales.  Abdominal:     General: Bowel sounds are normal. There is no distension.     Palpations: Abdomen is soft. There is no mass.     Tenderness: There is no abdominal tenderness.  Musculoskeletal:        General: No swelling.     Right lower leg: No edema.     Left lower leg: No edema.  Lymphadenopathy:     Cervical: No cervical adenopathy.     Upper Body:     Right upper body: No supraclavicular or axillary adenopathy.     Left upper body: No supraclavicular or axillary adenopathy.     Lower Body: No right inguinal adenopathy. No left inguinal adenopathy.  Skin:    General: Skin is warm.     Coloration: Skin is not jaundiced.     Findings: No lesion or rash.  Neurological:     General: No focal deficit present.     Mental Status: She is alert and oriented to person, place, and time. Mental status is at baseline.     Cranial Nerves: Cranial nerves are intact.  Psychiatric:        Mood and Affect: Mood normal.        Behavior: Behavior normal.  Thought Content: Thought content normal.   LABS:   CBC Latest Ref Rng & Units 04/25/2021 12/20/2020 08/08/2020  WBC - 5.6 5.5 2.4  Hemoglobin 12.0 - 16.0 12.9 13.1 12.3  Hematocrit 36 - 46 40 39 36  Platelets 150 - 399 343 292 226   CMP Latest Ref Rng & Units 04/25/2021 12/20/2020 08/08/2020  Glucose 65 - 99 mg/dL - - -  BUN 4 - 21 14 13 16   Creatinine 0.5 - 1.1 0.6 0.5 0.5  Sodium 137 - 147 138 140 141  Potassium 3.4 - 5.3 4.4 4.1 3.6  Chloride 99 - 108 104 106 107  CO2 13 - 22 29(A) 29(A) 30(A)  Calcium 8.7 - 10.7 9.7 9.1 9.0  Total Protein 6.0 - 8.5 g/dL - - -  Total Bilirubin 0.0 - 1.2 mg/dL - - -  Alkaline Phos 25 - 125 59 66 58  AST 13 - 35 24 22 40(A)  ALT 7 - 35 11 14 36(A)    STUDIES:  Chest x-ray imaging  reveals the following:  FINDINGS: Right chest infusion port catheter with tip projecting at the level of the lower SVC. Heart size within normal limits. Redemonstrated postsurgical changes to the right thorax with scarring in the right lung apex and right perihilar region. Chronic elevation of the right hemidiaphragm. No appreciable consolidation within the left lung. No acute bony abnormality. Surgical clips within the right upper quadrant of the abdomen.  IMPRESSION: No new or progressive findings. Redemonstrated postsurgical changes to the right thorax with scarring in the right lung apex and right perihilar region. Chronic elevation of the right hemidiaphragm.  ASSESSMENT & PLAN:  Assessment/Plan:  A 60 y.o. female with mediastinal and right supraclavicular nodal recurrence of her lung adenocarcinoma, who completed all of her definitive therapy in March 2021.  In clinic today, I went over her chest x-ray images with her, for which she could see she continues to show no evidence of disease recurrence.  Clinically, the patient is doing okay.  However, as her coughing remains prominent, I will refer her to a pulmonologist in [place] to better determine what measures can be taken to remedy this symptom.  Otherwise, I will see this patient back in 4 months for repeat clinical assessment.  A chest CT will be a day before her next visit for her continued radiographic lung cancer surveillance.  The patient understands all the plans discussed today and is in agreement with them.     I, Rita Ohara, am acting as scribe for Marice Potter, MD    I have reviewed this report as typed by the medical scribe, and it is complete and accurate.  Zalia Hautala Macarthur Critchley, MD

## 2021-04-25 ENCOUNTER — Inpatient Hospital Stay: Payer: Medicare HMO | Attending: Oncology | Admitting: Oncology

## 2021-04-25 ENCOUNTER — Inpatient Hospital Stay: Payer: Medicare HMO

## 2021-04-25 ENCOUNTER — Other Ambulatory Visit: Payer: Self-pay | Admitting: Hematology and Oncology

## 2021-04-25 ENCOUNTER — Telehealth: Payer: Self-pay | Admitting: Oncology

## 2021-04-25 ENCOUNTER — Other Ambulatory Visit: Payer: Self-pay | Admitting: Oncology

## 2021-04-25 VITALS — BP 146/81 | HR 83 | Temp 98.2°F | Resp 16 | Ht 62.0 in | Wt 135.4 lb

## 2021-04-25 DIAGNOSIS — Z452 Encounter for adjustment and management of vascular access device: Secondary | ICD-10-CM | POA: Diagnosis not present

## 2021-04-25 DIAGNOSIS — C3491 Malignant neoplasm of unspecified part of right bronchus or lung: Secondary | ICD-10-CM | POA: Insufficient documentation

## 2021-04-25 DIAGNOSIS — C3411 Malignant neoplasm of upper lobe, right bronchus or lung: Secondary | ICD-10-CM | POA: Diagnosis not present

## 2021-04-25 LAB — HEPATIC FUNCTION PANEL
ALT: 11 (ref 7–35)
AST: 24 (ref 13–35)
Alkaline Phosphatase: 59 (ref 25–125)
Bilirubin, Total: 0.5

## 2021-04-25 LAB — CBC: RBC: 4.58 (ref 3.87–5.11)

## 2021-04-25 LAB — CBC AND DIFFERENTIAL
HCT: 40 (ref 36–46)
Hemoglobin: 12.9 (ref 12.0–16.0)
Neutrophils Absolute: 3.86
Platelets: 343 (ref 150–399)
WBC: 5.6

## 2021-04-25 LAB — BASIC METABOLIC PANEL
BUN: 14 (ref 4–21)
CO2: 29 — AB (ref 13–22)
Chloride: 104 (ref 99–108)
Creatinine: 0.6 (ref 0.5–1.1)
Glucose: 94
Potassium: 4.4 (ref 3.4–5.3)
Sodium: 138 (ref 137–147)

## 2021-04-25 LAB — COMPREHENSIVE METABOLIC PANEL
Albumin: 4.2 (ref 3.5–5.0)
Calcium: 9.7 (ref 8.7–10.7)

## 2021-04-25 MED ORDER — SODIUM CHLORIDE 0.9% FLUSH
10.0000 mL | INTRAVENOUS | Status: DC | PRN
  Administered 2021-04-25: 10 mL

## 2021-04-25 MED ORDER — HEPARIN SOD (PORK) LOCK FLUSH 100 UNIT/ML IV SOLN
500.0000 [IU] | Freq: Once | INTRAVENOUS | Status: AC | PRN
  Administered 2021-04-25: 500 [IU]

## 2021-04-25 NOTE — Telephone Encounter (Signed)
Patient called t see if she needed her Port Access before X-Ray.  She does not due to it just being an X-Ray and not CT Chest

## 2021-04-25 NOTE — Telephone Encounter (Signed)
Per 10/11 LOS, patient scheduled for Feb 2023 Follow Up.  Patient will check MyChart for Appt  Will scheduled CT Chest after receiving [facility]

## 2021-05-09 ENCOUNTER — Encounter: Payer: Self-pay | Admitting: Hematology and Oncology

## 2021-05-25 ENCOUNTER — Other Ambulatory Visit: Payer: Self-pay

## 2021-05-25 ENCOUNTER — Telehealth: Payer: Self-pay | Admitting: Pulmonary Disease

## 2021-05-25 NOTE — Telephone Encounter (Signed)
DISREGARD

## 2021-05-26 ENCOUNTER — Encounter: Payer: Self-pay | Admitting: Cardiology

## 2021-05-26 ENCOUNTER — Other Ambulatory Visit: Payer: Self-pay

## 2021-05-26 ENCOUNTER — Ambulatory Visit (INDEPENDENT_AMBULATORY_CARE_PROVIDER_SITE_OTHER): Payer: Medicare HMO | Admitting: Cardiology

## 2021-05-26 VITALS — BP 120/76 | HR 71 | Ht 62.0 in | Wt 135.6 lb

## 2021-05-26 DIAGNOSIS — E782 Mixed hyperlipidemia: Secondary | ICD-10-CM

## 2021-05-26 DIAGNOSIS — J449 Chronic obstructive pulmonary disease, unspecified: Secondary | ICD-10-CM | POA: Diagnosis not present

## 2021-05-26 DIAGNOSIS — I25119 Atherosclerotic heart disease of native coronary artery with unspecified angina pectoris: Secondary | ICD-10-CM | POA: Diagnosis not present

## 2021-05-26 DIAGNOSIS — Z789 Other specified health status: Secondary | ICD-10-CM | POA: Diagnosis not present

## 2021-05-26 LAB — COMPREHENSIVE METABOLIC PANEL
ALT: 7 IU/L (ref 0–32)
AST: 16 IU/L (ref 0–40)
Albumin/Globulin Ratio: 2 (ref 1.2–2.2)
Albumin: 4.5 g/dL (ref 3.8–4.8)
Alkaline Phosphatase: 54 IU/L (ref 44–121)
BUN/Creatinine Ratio: 31 — ABNORMAL HIGH (ref 12–28)
BUN: 17 mg/dL (ref 8–27)
Bilirubin Total: 0.4 mg/dL (ref 0.0–1.2)
CO2: 26 mmol/L (ref 20–29)
Calcium: 9.8 mg/dL (ref 8.7–10.3)
Chloride: 103 mmol/L (ref 96–106)
Creatinine, Ser: 0.55 mg/dL — ABNORMAL LOW (ref 0.57–1.00)
Globulin, Total: 2.3 g/dL (ref 1.5–4.5)
Glucose: 88 mg/dL (ref 70–99)
Potassium: 5.5 mmol/L — ABNORMAL HIGH (ref 3.5–5.2)
Sodium: 142 mmol/L (ref 134–144)
Total Protein: 6.8 g/dL (ref 6.0–8.5)
eGFR: 104 mL/min/{1.73_m2} (ref >59)

## 2021-05-26 LAB — LIPID PANEL
Chol/HDL Ratio: 3.2 ratio (ref 0.0–4.4)
Cholesterol, Total: 166 mg/dL (ref 100–199)
HDL: 52 mg/dL (ref >39)
LDL Chol Calc (NIH): 97 mg/dL (ref 0–99)
Triglycerides: 91 mg/dL (ref 0–149)
VLDL Cholesterol Cal: 17 mg/dL (ref 5–40)

## 2021-05-26 NOTE — Patient Instructions (Signed)
Medication Instructions:  No medication changes. *If you need a refill on your cardiac medications before your next appointment, please call your pharmacy*   Lab Work: Your physician recommends that you have a CMP and lipids done today.  If you have labs (blood work) drawn today and your tests are completely normal, you will receive your results only by: [place] (if you have MyChart) OR A paper copy in the mail If you have any lab test that is abnormal or we need to change your treatment, we will call you to review the results.   Testing/Procedures: None ordered   Follow-Up: At [facility], you and your health needs are our priority.  As part of our continuing mission to provide you with exceptional heart care, we have created designated Provider Care Teams.  These Care Teams include your primary Cardiologist (physician) and Advanced Practice Providers (APPs -  Physician Assistants and Nurse Practitioners) who all work together to provide you with the care you need, when you need it.  We recommend signing up for the patient portal called "MyChart".  Sign up information is provided on this After Visit Summary.  MyChart is used to connect with patients for Virtual Visits (Telemedicine).  Patients are able to view lab/test results, encounter notes, upcoming appointments, etc.  Non-urgent messages can be sent to your provider as well.   To learn more about what you can do with MyChart, go to [email or web address].    Your next appointment:   12 month(s)  The format for your next appointment:   In Person  Provider:   Shirlee More, MD   Other Instructions NA

## 2021-05-26 NOTE — Progress Notes (Signed)
Cardiology Office Note:    Date:  05/26/2021   ID:  Michelle King, DOB 1960-08-17, MRN [id number]  PCP:  Michelle Sanes, MD  Cardiologist:  Michelle More, MD    Referring MD: Michelle Sanes, MD    ASSESSMENT:    1. Coronary artery disease involving native coronary artery of native heart with angina pectoris ([place])   2. Mixed hyperlipidemia   3. Statin intolerance   4. Chronic obstructive pulmonary disease, unspecified COPD type ([place])    PLAN:    In order of problems listed above:  Stable CAD she is having no anginal discomfort at this time I will continue medical therapy clopidogrel along with lipid-lowering I will think she needs cardiac imaging this visit she agrees Continue her nonstatin treatment check CMP lipid profile with statin intolerance Functionally improved continue bronchodilators will be seeing pulmonary as an outpatient   Next appointment: 1 year   Medication Adjustments/Labs and Tests Ordered: Current medicines are reviewed at length with the patient today.  Concerns regarding medicines are outlined above.  Orders Placed This Encounter  Procedures   Comprehensive metabolic panel   Lipid panel   EKG 12-Lead   No orders of the defined types were placed in this encounter.   Chief Complaint  Patient presents with   Follow-up   Coronary Artery Disease   .[facility]  History of Present Illness:    Michelle King is a 60 y.o. female with a hx of coronary artery disease with ST elevation MI PCI and stent of proximal LAD in 2010 with severe ischemic cardiomyopathy and subsequent recovery of ejection fraction with guideline directed therapy, COPD lung cancer dyslipidemia statin intolerance and chronic urticaria.  She continues to follow with oncology with recurrence that was treated with chemoradiation.  She was last seen by me 10-14 2022.  She has ongoing pulmonary complaint of cough and was referred by oncology for pulmonary  evaluation.  Compliance with diet, lifestyle and medications: Yes  Michelle King has been doing well. She tolerates her lipid-lowering treatment bempedoic acid without muscle symptoms She has a chronic cough and irritation in her throat and is pending pulmonary evaluation She is not short of breath does not have wheezing. No edema chest pain palpitation or syncope.  Component Ref Range & Units 9 mo ago  (08/29/20) 1 yr ago  (02/15/20) 3 yr ago  (04/16/18) 3 yr ago  (03/11/18) 4 yr ago  (04/03/17) 4 yr ago  (02/20/17) 12 yr ago  (04/19/09)  Cholesterol, Total 100 - 199 mg/dL 161  296 High   115  162  237 High   262 High     Triglycerides 0 - 149 mg/dL 114  143  117  138  181 High   177 High   116 SLIGHT HEMOLYSIS R   HDL >39 mg/dL 44  54  43  46  46  45  36 SLIGHT HEMOLYSIS Low    VLDL Cholesterol Cal 5 - 40 mg/dL 21  26  23  28   36  35    LDL Chol Calc (NIH) 0 - 99 mg/dL 96  216 High         Chol/HDL Ratio 0.0 - 4.4 ratio 3.7  5.5 High  CM  2.7 CM  3.5 CM  5.2 High  CM   5.3 R    Past Medical History:  Diagnosis Date   Angio-edema 09/05/2016   Arthritis    spine   CAD (coronary artery disease)  Cancer [facility])    lung   Chronic cholecystitis 04/22/2019   Chronic idiopathic urticaria 26/94/8546   Complication of anesthesia    COPD (chronic obstructive pulmonary disease) (HCC)    Coronary artery disease involving native coronary artery of native heart with angina pectoris [facility])    Overview:  Anterior MI 2010 with PCI and stent Her last stress test pharmacologic showed normal ejection fraction and no evidence of ischemia. She has a history of previous LAD infarction with severely depressed ejection fraction that normalized with medical therapy and revascularization percutaneously.   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   Pruritus 02/26/2021   Pure hypercholesterolemia 03/01/2015   Restless legs 03/11/2018   S/P hysterectomy    S/P lobectomy of lung 03/25/2015   Statin intolerance 03/10/2018    Past Surgical History:  Procedure Laterality Date   APPENDECTOMY     CARDIAC CATHETERIZATION     stents   COLONOSCOPY N/A 04/18/2015   Procedure: COLONOSCOPY;  Surgeon: Ladene Artist, MD;  Location: [facility] ENDOSCOPY;  Service: Endoscopy;  Laterality: N/A;   MOUTH SURGERY     teeth   TUBAL LIGATION     VIDEO ASSISTED THORACOSCOPY (VATS)/WEDGE RESECTION Right 03/25/2015   Procedure: VIDEO ASSISTED THORACOSCOPY (VATS)/LUNG RESECTION;  Surgeon: Grace Isaac, MD;  Location: [place];  Service: Thoracic;  Laterality: Right;   VIDEO BRONCHOSCOPY N/A 03/25/2015   Procedure: VIDEO BRONCHOSCOPY;  Surgeon: Grace Isaac, MD;  Location: [place];  Service: Thoracic;  Laterality: N/A;   VIDEO BRONCHOSCOPY WITH ENDOBRONCHIAL ULTRASOUND N/A 04/18/2018   Procedure: VIDEO BRONCHOSCOPY WITH ENDOBRONCHIAL ULTRASOUND;  Surgeon: Grace Isaac, MD;  Location: [place];  Service: Thoracic;  Laterality: N/A;   VIDEO BRONCHOSCOPY WITH INSERTION OF INTERBRONCHIAL VALVE (IBV) N/A 04/12/2015   Procedure: VIDEO BRONCHOSCOPY WITH INSERTION OF 7MM INTERBRONCHIAL VALVES (IBV) IN SUPERIOR SEGMENT, MEDIAL AND POSTERIOR SEGMENT, AND LATERAL SEGMENT OF RIGHT LOWER LOBE;  Surgeon: Grace Isaac, MD;  Location: [facility] OR;  Service: Thoracic;  Laterality: N/A;  19mm Spiration Valve placed in Superior Segment, Medial and Posterior Segment, and Lateral Segment of Right Lower Lobe   VIDEO  BRONCHOSCOPY WITH INSERTION OF INTERBRONCHIAL VALVE (IBV) Right 04/19/2015   Procedure: VIDEO BRONCHOSCOPY WITH INSERTION OF INTERBRONCHIAL VALVE (IBV);  Surgeon: Grace Isaac, MD;  Location: [place];  Service: Thoracic;  Laterality: Right;   VIDEO BRONCHOSCOPY WITH INSERTION OF INTERBRONCHIAL VALVE (IBV) N/A 06/03/2015   Procedure: VIDEO BRONCHOSCOPY WITH REMOVAL OF INTERBRONCHIAL VALVE (IBV);  Surgeon: Grace Isaac, MD;  Location: [facility] OR;  Service: Thoracic;  Laterality: N/A;    Current Medications: Current Meds  Medication Sig   albuterol (PROVENTIL HFA;VENTOLIN HFA) 108 (90 Base) MCG/ACT inhaler Inhale 2 puffs into the lungs every 4 (four) hours as needed for wheezing or shortness of breath.    benzonatate (TESSALON) 100 MG capsule Take 1 capsule (100 mg total) by mouth 3 (three) times daily as needed for cough.  BREO ELLIPTA 100-25 MCG/INH AEPB Inhale 1 puff into the lungs as needed for wheezing or shortness of breath.   cetirizine (ZYRTEC) 10 MG tablet Take 10 mg by mouth daily.   clobetasol ointment (TEMOVATE) 2.59 % Apply 1 application topically 2 (two) times daily.   clopidogrel (PLAVIX) 75 MG tablet Take 1 tablet by mouth once daily   gabapentin (NEURONTIN) 100 MG capsule Take 100 mg by mouth 2 (two) times daily.    ibuprofen (ADVIL) 600 MG tablet Take 600 mg by mouth 3 (three) times daily as needed for pain or headache.   NEXLETOL 180 MG TABS Take 1 tablet by mouth once daily   SYMBICORT 160-4.5 MCG/ACT inhaler Inhale 2 puffs into the lungs as needed for wheezing or shortness of breath.   traMADol (ULTRAM) 50 MG tablet Take 50 mg by mouth as needed for moderate pain.     Allergies:   Codeine, Rosuvastatin, and Sympathomimetics   Social History   Socioeconomic History   Marital status: Married    Spouse name: Not on file   Number of children: Not on file   Years of education: Not on file   Highest education level: Not on file  Occupational History   Not on file   Tobacco Use   Smoking status: Former    Packs/day: 0.50    Years: 40.00    Pack years: 20.00    Types: Cigarettes    Quit date: 03/25/2015    Years since quitting: 6.1   Smokeless tobacco: Never   Tobacco comments:    down to 4 aday  Vaping Use   Vaping Use: Never used  Substance and Sexual Activity   Alcohol use: No    Alcohol/week: 0.0 standard drinks   Drug use: No   Sexual activity: Not on file  Other Topics Concern   Not on file  Social History Narrative   Not on file   Social Determinants of Health   Financial Resource Strain: Not on file  Food Insecurity: Not on file  Transportation Needs: Not on file  Physical Activity: Not on file  Stress: Not on file  Social Connections: Not on file     Family History: The patient's family history includes Cancer in her brother, father, and sister; Cerebrovascular Disease in her brother and brother; Diabetes in her mother; Heart disease in her brother and brother; Stroke in her sister and sister. ROS:   Please see the history of present illness.    All other systems reviewed and are negative.  EKGs/Labs/Other Studies Reviewed:    The following studies were reviewed today:  EKG:  EKG ordered today and personally reviewed.  The ekg ordered today demonstrates sinus rhythm and is normal  Recent Labs: 04/25/2021: ALT 11; BUN 14; Creatinine 0.6; Hemoglobin 12.9; Platelets 343; Potassium 4.4; Sodium 138  Recent Lipid Panel    Component Value Date/Time   CHOL 161 08/29/2020 0848   TRIG 114 08/29/2020 0848   HDL 44 08/29/2020 0848   CHOLHDL 3.7 08/29/2020 0848   CHOLHDL 5.3 04/19/2009 1130   VLDL 23 04/19/2009 1130   LDLCALC 96 08/29/2020 0848    Physical Exam:    VS:  BP 120/76   Pulse 71   Ht 5\' 2"  (1.575 m)   Wt 135 lb 9.6 oz (61.5 kg)   SpO2 97%   BMI 24.80 kg/m     Wt Readings from Last 3 Encounters:  05/26/21 135 lb 9.6 oz (61.5 kg)  04/25/21 135 lb  6.4 oz (61.4 kg)  12/21/20 134 lb 9.6 oz (61.1 kg)      GEN: She does not look chronically ill well nourished, well developed in no acute distress HEENT: Normal NECK: No JVD; No carotid bruits LYMPHATICS: No lymphadenopathy CARDIAC: RRR, no murmurs, rubs, gallops RESPIRATORY:  Clear to auscultation without rales, wheezing or rhonchi  ABDOMEN: Soft, non-tender, non-distended MUSCULOSKELETAL:  No edema; No deformity  SKIN: Warm and dry NEUROLOGIC:  Alert and oriented x 3 PSYCHIATRIC:  Normal affect    Signed, Michelle More, MD  05/26/2021 11:05 AM    [place]

## 2021-05-29 ENCOUNTER — Telehealth: Payer: Self-pay

## 2021-05-29 NOTE — Telephone Encounter (Signed)
-----   Message from Richardo Priest, MD sent at 05/29/2021  8:58 AM EST ----- Good results lipids are ideal no changes

## 2021-05-29 NOTE — Telephone Encounter (Signed)
Spoke with patient regarding results and recommendation.  Patient verbalizes understanding and is agreeable to plan of care. Advised patient to call back with any issues or concerns.  

## 2021-06-02 ENCOUNTER — Encounter: Payer: Self-pay | Admitting: Pulmonary Disease

## 2021-06-02 ENCOUNTER — Other Ambulatory Visit: Payer: Self-pay

## 2021-06-02 ENCOUNTER — Ambulatory Visit (INDEPENDENT_AMBULATORY_CARE_PROVIDER_SITE_OTHER): Payer: Medicare HMO | Admitting: Pulmonary Disease

## 2021-06-02 DIAGNOSIS — R053 Chronic cough: Secondary | ICD-10-CM

## 2021-06-02 DIAGNOSIS — J432 Centrilobular emphysema: Secondary | ICD-10-CM | POA: Diagnosis not present

## 2021-06-02 HISTORY — DX: Chronic cough: R05.3

## 2021-06-02 MED ORDER — DEXTROMETHORPHAN POLISTIREX ER 30 MG/5ML PO SUER: 5.0000 mL | Freq: Two times a day (BID) | ORAL | 0 refills | Status: DC | PRN

## 2021-06-02 MED ORDER — SYMBICORT 160-4.5 MCG/ACT IN AERO: 2.0000 | INHALATION_SPRAY | RESPIRATORY_TRACT | 3 refills | Status: DC | PRN

## 2021-06-02 MED ORDER — OMEPRAZOLE 20 MG PO CPDR: DELAYED_RELEASE_CAPSULE | ORAL | 1 refills | Status: DC

## 2021-06-02 MED ORDER — PREDNISONE 10 MG PO TABS: ORAL_TABLET | ORAL | 0 refills | Status: DC

## 2021-06-02 NOTE — Assessment & Plan Note (Signed)
Refills were provided for Symbicort and albuterol Can discontinue Breo

## 2021-06-02 NOTE — Progress Notes (Signed)
Subjective:    Patient ID: Michelle King, female    DOB: 02-24-1961, 60 y.o.   MRN: [id number]  HPI  60 year old ex-smoker with a history of lung cancer presents for evaluation of chronic cough. She initially presented with cough in 2016 and underwent right upper lobectomy by Dr. Servando Snare.  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 She had recurrence in the mediastinum and presented with right supraclavicular lymphadenopathy and underwent chemoradiation and completed 1 year of maintenance durvalumab in March 2021.  Since then she has had 6 monthly imaging for lung cancer surveillance.  She complains of chronic cough for the past 2 to 3 years.  She denies significant postnasal drip or reflux symptoms. She has been maintained on Breo and Symbicort which she takes alternately on an as-needed basis.  Tessalon Perles have not helped  I have reviewed oncology notes. Chest x-ray 04/25/2021 showed scarring in the right apex and perihilar region with elevated right hemidiaphragm suggesting volume loss on the right  She quit smoking in 2016, more than 40 pack years   PMH -CAD post stents, emphysema, restless leg syndrome  Significant tests/ events reviewed  CT chest 12/2020 -postradiation scarring in the right apex and suprahilar right lung, underlying centrilobular emphysema, nodular foci on left has resolved  02/2015 PFTs showed no evidence of airway obstruction with ratio of 75, FEV1 100%, FVC 102%, DLCO 74%   Past Medical History:  Diagnosis Date   Angio-edema 09/05/2016   Arthritis    spine   CAD (coronary artery disease)    Cancer (HCC)    lung   Chronic cholecystitis 04/22/2019   Chronic idiopathic urticaria 46/96/2952   Complication of anesthesia    COPD (chronic obstructive pulmonary disease) (HCC)    Coronary artery disease  involving native coronary artery of native heart with angina pectoris ([place])    Overview:  Anterior MI 2010 with PCI and stent Her last stress test pharmacologic showed normal ejection fraction and no evidence of ischemia. She has a history of previous LAD infarction with severely depressed ejection fraction that normalized with medical therapy and revascularization percutaneously.   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   Pruritus 02/26/2021   Pure hypercholesterolemia 03/01/2015   Restless legs 03/11/2018   S/P hysterectomy    S/P lobectomy of lung 03/25/2015   Statin intolerance 03/10/2018    Past Surgical History:  Procedure Laterality Date   APPENDECTOMY     CARDIAC CATHETERIZATION     stents   COLONOSCOPY N/A 04/18/2015   Procedure: COLONOSCOPY;  Surgeon: Ladene Artist, MD;  Location: [facility] ENDOSCOPY;  Service: Endoscopy;  Laterality: N/A;   MOUTH SURGERY     teeth   TUBAL LIGATION     VIDEO ASSISTED THORACOSCOPY (VATS)/WEDGE RESECTION Right 03/25/2015   Procedure: VIDEO ASSISTED THORACOSCOPY (VATS)/LUNG RESECTION;  Surgeon: Grace Isaac, MD;  Location: [place] OR;  Service: Thoracic;  Laterality: Right;   VIDEO BRONCHOSCOPY N/A 03/25/2015   Procedure: VIDEO BRONCHOSCOPY;  Surgeon: Grace Isaac, MD;  Location: [place];  Service: Thoracic;  Laterality: N/A;   VIDEO BRONCHOSCOPY WITH ENDOBRONCHIAL ULTRASOUND N/A 04/18/2018   Procedure: VIDEO  BRONCHOSCOPY WITH ENDOBRONCHIAL ULTRASOUND;  Surgeon: Grace Isaac, MD;  Location: [place];  Service: Thoracic;  Laterality: N/A;   VIDEO BRONCHOSCOPY WITH INSERTION OF INTERBRONCHIAL VALVE (IBV) N/A 04/12/2015   Procedure: VIDEO BRONCHOSCOPY WITH INSERTION OF 7MM INTERBRONCHIAL VALVES (IBV) IN SUPERIOR SEGMENT, MEDIAL AND POSTERIOR SEGMENT, AND LATERAL SEGMENT OF RIGHT LOWER LOBE;  Surgeon: Grace Isaac, MD;  Location: [facility] OR;  Service: Thoracic;  Laterality: N/A;  57mm Spiration Valve placed in Superior Segment, Medial and Posterior Segment, and Lateral Segment of Right Lower Lobe   VIDEO BRONCHOSCOPY WITH INSERTION OF INTERBRONCHIAL VALVE (IBV) Right 04/19/2015   Procedure: VIDEO BRONCHOSCOPY WITH INSERTION OF INTERBRONCHIAL VALVE (IBV);  Surgeon: Grace Isaac, MD;  Location: [place];  Service: Thoracic;  Laterality: Right;   VIDEO BRONCHOSCOPY WITH INSERTION OF INTERBRONCHIAL VALVE (IBV) N/A 06/03/2015   Procedure: VIDEO BRONCHOSCOPY WITH REMOVAL OF INTERBRONCHIAL VALVE (IBV);  Surgeon: Grace Isaac, MD;  Location: [facility] OR;  Service: Thoracic;  Laterality: N/A;    Allergies  Allergen Reactions   Codeine Itching, Swelling and Anaphylaxis    Facial swelling Tolerates norco   Rosuvastatin Other (See Comments) and Anaphylaxis    Nose bleeds   Sympathomimetics Other (See Comments)    UNSPECIFIED REACTION  "Pt was not told what the reaction was"    .soc   Family History  Problem Relation Age of Onset   Diabetes Mother    Cancer Father        colon   Stroke Sister    Cancer Brother        colon cancer   Cancer Sister        lung cancer   Heart disease Brother    Cerebrovascular Disease Brother    Heart disease Brother    Cerebrovascular Disease Brother    Stroke Sister       Review of Systems Shortness of breath with activity Nonproductive cough   Constitutional: negative for anorexia, fevers and sweats  Eyes: negative for irritation, redness and visual  disturbance  Ears, nose, mouth, throat, and face: negative for earaches, epistaxis, nasal congestion and sore throat  Respiratory: negative for sputum and wheezing  Cardiovascular: negative for chest pain, lower extremity edema, orthopnea, palpitations and syncope  Gastrointestinal: negative for abdominal pain, constipation, diarrhea, melena, nausea and vomiting  Genitourinary:negative for dysuria, frequency and hematuria  Hematologic/lymphatic: negative for bleeding, easy bruising and lymphadenopathy  Musculoskeletal:negative for arthralgias, muscle weakness and stiff joints  Neurological: negative for coordination problems, gait problems, headaches and weakness  Endocrine: negative for diabetic symptoms including polydipsia, polyuria and weight loss     Objective:   Physical Exam  Gen. Pleasant, well-nourished, in no distress, normal affect ENT - no pallor,icterus, no post nasal drip , edentulous, small amount of crusted blood in the left nare Neck: No JVD, no thyromegaly, no carotid bruits Lungs: no use of accessory muscles, no dullness to percussion, decreased on RT  without rales or rhonchi  Cardiovascular: Rhythm regular, heart sounds  normal, no murmurs or gallops, no peripheral edema Abdomen: soft and non-tender, no hepatosplenomegaly, BS normal. Musculoskeletal: No deformities, no cyanosis or clubbing Neuro:  alert, non focal  Assessment & Plan:    Assessment:    1 or more chronic illnesses with severe exacerbation, progression, or side effects of treatment;   1 acute or chronic illness or injury that poses a threat to life or bodily function  Plan Following Extensive Data Review & Interpretation:  I reviewed prior external note(s) from oncology-    I reviewed the result(s) of CT chest  I have ordered therapeutic trial followed by bronchoscopy if not helpful  Independent interpretation of tests  Review of patient's CT images revealed radiation fibrosis of  right upper lobe. The patient's images have been independently reviewed by me.    Discussion of management or test interpretation with another colleague oncology, radiology.

## 2021-06-02 NOTE — Patient Instructions (Addendum)
  Chronic cough may be related to post nasal drip , GERD or scarring of right lung  Prednisone 10 mg tabs  Take 2 tabs daily with food x 7ds, then 1 tab daily with food x 7ds then STOP  Refills on symbicort 2 puffs twice daily  Rx for omeprazole 20 gm twice daily x 4 weeks to take after   Delsym cough syrup 5 ml twice daily as needed

## 2021-06-02 NOTE — Assessment & Plan Note (Signed)
Cough has been ongoing for more than 2 years, this seems to be different from her initial presentation of cancer and is persistent.  Surveillance CT has not shown any evidence of recurrent malignancy. There is no overt symptoms of postnasal drip or reflux however  Chronic cough may be related to post nasal drip , GERD or postradiation fibrosis of right lung I discussed inspection bronchoscopy but they would like to avoid procedures and try medical therapy first  We will undertake empiric trial of prednisone Prednisone 10 mg tabs  Take 2 tabs daily with food x 7ds, then 1 tab daily with food x 7ds then STOP  Refills on symbicort 2 puffs twice daily -explained to her to be consistent with this  Rx for omeprazole 20 gm twice daily x 4 weeks to take after  -we will undertake sequential treatment of reflux after trial of prednisone and they will report back if any of these trials are helpful  Delsym cough syrup 5 ml twice daily as needed

## 2021-07-19 ENCOUNTER — Ambulatory Visit: Payer: Medicare HMO | Admitting: Pulmonary Disease

## 2021-07-31 ENCOUNTER — Other Ambulatory Visit: Payer: Self-pay | Admitting: Cardiology

## 2021-08-18 ENCOUNTER — Other Ambulatory Visit: Payer: Self-pay

## 2021-08-18 ENCOUNTER — Encounter: Payer: Self-pay | Admitting: Pulmonary Disease

## 2021-08-18 ENCOUNTER — Encounter: Payer: Self-pay | Admitting: Hematology and Oncology

## 2021-08-18 ENCOUNTER — Ambulatory Visit (INDEPENDENT_AMBULATORY_CARE_PROVIDER_SITE_OTHER): Payer: Medicare HMO | Admitting: Pulmonary Disease

## 2021-08-18 VITALS — BP 118/68 | HR 94 | Temp 98.4°F | Ht 61.0 in | Wt 132.6 lb

## 2021-08-18 DIAGNOSIS — C3411 Malignant neoplasm of upper lobe, right bronchus or lung: Secondary | ICD-10-CM

## 2021-08-18 DIAGNOSIS — R053 Chronic cough: Secondary | ICD-10-CM | POA: Diagnosis not present

## 2021-08-18 DIAGNOSIS — J432 Centrilobular emphysema: Secondary | ICD-10-CM

## 2021-08-18 NOTE — Assessment & Plan Note (Signed)
Continue Symbicort 2 puffs twice daily. Use albuterol as needed for wheezing. She supplements with Breo samples on occasion

## 2021-08-18 NOTE — Progress Notes (Signed)
Subjective:    Patient ID: Michelle King, female    DOB: [birth year], 61 y.o.   MRN: [id number]  HPI  2-y o ex-smoker with a history of lung cancer for FU of chronic cough.  PMH -CAD post stents, emphysema, restless leg syndrome  She underwent right upper lobectomy by Dr. Servando Snare in 2016.  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 She had recurrence in the mediastinum  with right supraclavicular lymphadenopathy and underwent chemoradiation and completed 1 year of maintenance durvalumab in March 2021.     She complains of chronic cough for the past 5years.  She denies significant postnasal drip or reflux symptoms. She has been maintained on Breo and Symbicort which she takes alternately on an as-needed basis.  Tessalon Perles have not helped     She quit smoking in 2016, more than 40 pack years    Chief Complaint  Patient presents with   Follow-up    Follow up. Pt states there is no change in her cough. Pt states that it is a dry cough      Initial OV >> trial of prednisone , tessalon perles, PPI sequentially  None of these seem to help. She continues to complain of coughing spells which occasionally wake her up from sleep.  Accompanied by husband who corroborates history.  He is convinced that the cough is coming from her throat and perhaps an inspection would help? She is on gabapentin and tramadol for chronic pain    Significant tests/ events reviewed   CT chest 12/2020 -postradiation scarring in the right apex and suprahilar right lung, underlying centrilobular emphysema, nodular foci on left has resolved   02/2015 PFTs showed no evidence of airway obstruction with ratio of 75, FEV1 100%, FVC 102%, DLCO 74%   Past Medical History:  Diagnosis Date   Angio-edema 09/05/2016   Arthritis    spine   CAD (coronary artery disease)     Cancer (HCC)    lung   Chronic cholecystitis 04/22/2019   Chronic idiopathic urticaria 03/54/6568   Complication of anesthesia    COPD (chronic obstructive pulmonary disease) (HCC)    Coronary artery disease involving native coronary artery of native heart with angina pectoris ([place])    Overview:  Anterior MI 2010 with PCI and stent Her last stress test pharmacologic showed normal ejection fraction and no evidence of ischemia. She has a history of previous LAD infarction with severely depressed ejection fraction that normalized with medical therapy and revascularization percutaneously.   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 Also denies presyncope, palpitations, heartburn, abdominal pain, nausea, vomiting, diarrhea or change in bowel or urinary habits, dysuria,hematuria, rash, arthralgias, visual complaints, headache, numbness weakness or ataxia.     Objective:   Physical Exam  Gen. Pleasant, well-nourished, in no  distress ENT - no thrush, no pallor/icterus,no post nasal drip Neck: No JVD, no thyromegaly, no carotid bruits Lungs: no use of accessory muscles, no dullness to percussion, clear without rales or rhonchi  Cardiovascular: Rhythm regular, heart sounds  normal, no murmurs or gallops, no peripheral edema Musculoskeletal: No deformities, no cyanosis or clubbing        Assessment & Plan:

## 2021-08-18 NOTE — H&P (View-Only) (Signed)
Subjective:    Patient ID: Michelle King, female    DOB: 1960/09/05, 61 y.o.   MRN: [id number]  HPI  61-y o ex-smoker with a history of lung cancer for FU of chronic cough.  PMH -CAD post stents, emphysema, restless leg syndrome  She underwent right upper lobectomy by Dr. Servando Snare in 2016.  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 She had recurrence in the mediastinum  with right supraclavicular lymphadenopathy and underwent chemoradiation and completed 1 year of maintenance durvalumab in March 2021.     She complains of chronic cough for the past 5years.  She denies significant postnasal drip or reflux symptoms. She has been maintained on Breo and Symbicort which she takes alternately on an as-needed basis.  Tessalon Perles have not helped     She quit smoking in 2016, more than 40 pack years    Chief Complaint  Patient presents with   Follow-up    Follow up. Pt states there is no change in her cough. Pt states that it is a dry cough      Initial OV >> trial of prednisone , tessalon perles, PPI sequentially  None of these seem to help. She continues to complain of coughing spells which occasionally wake her up from sleep.  Accompanied by husband who corroborates history.  He is convinced that the cough is coming from her throat and perhaps an inspection would help? She is on gabapentin and tramadol for chronic pain    Significant tests/ events reviewed   CT chest 12/2020 -postradiation scarring in the right apex and suprahilar right lung, underlying centrilobular emphysema, nodular foci on left has resolved   02/2015 PFTs showed no evidence of airway obstruction with ratio of 75, FEV1 100%, FVC 102%, DLCO 74%   Past Medical History:  Diagnosis Date   Angio-edema 09/05/2016   Arthritis    spine   CAD (coronary artery disease)     Cancer (HCC)    lung   Chronic cholecystitis 04/22/2019   Chronic idiopathic urticaria 62/82/9562   Complication of anesthesia    COPD (chronic obstructive pulmonary disease) (HCC)    Coronary artery disease involving native coronary artery of native heart with angina pectoris ([place])    Overview:  Anterior MI 2010 with PCI and stent Her last stress test pharmacologic showed normal ejection fraction and no evidence of ischemia. She has a history of previous LAD infarction with severely depressed ejection fraction that normalized with medical therapy and revascularization percutaneously.   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 Also denies presyncope, palpitations, heartburn, abdominal pain, nausea, vomiting, diarrhea or change in bowel or urinary habits, dysuria,hematuria, rash, arthralgias, visual complaints, headache, numbness weakness or ataxia.     Objective:   Physical Exam  Gen. Pleasant, well-nourished, in no  distress ENT - no thrush, no pallor/icterus,no post nasal drip Neck: No JVD, no thyromegaly, no carotid bruits Lungs: no use of accessory muscles, no dullness to percussion, clear without rales or rhonchi  Cardiovascular: Rhythm regular, heart sounds  normal, no murmurs or gallops, no peripheral edema Musculoskeletal: No deformities, no cyanosis or clubbing        Assessment & Plan:

## 2021-08-18 NOTE — Patient Instructions (Signed)
Trial of Chlor-Trimeton 4 mg at bedtime for 3 to 4 weeks OTC for postnasal drip  X ENT consult for tickle in the throat  I will follow-up CT scan results from next week and we will decide about bronchoscopy.  Please call me back in 2 weeks to report on cough.  We will have to hold Plavix if we proceed with bronchoscopy

## 2021-08-18 NOTE — Assessment & Plan Note (Signed)
Surveillance 29-month CT scan scheduled for next week at [facility]

## 2021-08-18 NOTE — Assessment & Plan Note (Signed)
I explained that the chronic cough that has been lasting for 7 years is most likely due to postnasal drip.  Trial of Chlor-Trimeton 4 mg at bedtime for 3 to 4 weeks OTC for postnasal drip  X ENT consult for tickle in the throat -husband is convinced that cough  is emanating from the throat  I will follow-up CT scan results from next week and we will decide about bronchoscopy.  Please call me back in 2 weeks to report on cough.  We will have to hold Plavix if we proceed with bronchoscopy

## 2021-08-21 ENCOUNTER — Other Ambulatory Visit: Payer: Self-pay

## 2021-08-21 MED ORDER — NEXLETOL 180 MG PO TABS: 1.0000 | ORAL_TABLET | Freq: Every day | ORAL | 2 refills | Status: DC

## 2021-08-21 NOTE — Telephone Encounter (Signed)
Refill of Nexletol 180 mg sent to [facility].

## 2021-08-21 NOTE — Progress Notes (Signed)
[facility]  [address] [place],  [place]  [address] [phone]  Clinic Day:  08/25/2021  Referring physician: Charlotte Sanes, MD  This document serves as a record of services personally performed by Marice Potter, MD. It was created on their behalf by Curry,Lauren E, a trained medical scribe. The creation of this record is based on the scribe's personal observations and the provider's statements to them.  HISTORY OF PRESENT ILLNESS:  The patient is a 61 y.o. female with mediastinal and right supraclavicular nodal recurrence of her previous lung adenocarcinoma.  She did undergo chemoradiation before completing 1 year of maintenance durvalumab in March 2021.  She comes in today to review her CT chest results done today as part of her continued radiographic lung cancer surveillance.  Since her last visit, the patient has been doing okay.  She claims her coughing has gotten somewhat better.  She has been seen by a respiratory therapist who claims her coughing may be sinus related.  She denies having progressive shortness of breath, hemoptysis, or any other respiratory symptoms which concern her for possible disease progression.   PHYSICAL EXAM:  Blood pressure 109/76, pulse 93, temperature 98 F (36.7 C), temperature source Oral, resp. rate 17, weight 132 lb (59.9 kg), SpO2 96 %. Wt Readings from Last 3 Encounters:  08/25/21 132 lb (59.9 kg)  08/18/21 132 lb 9.6 oz (60.1 kg)  06/02/21 136 lb (61.7 kg)   Body mass index is 24.94 kg/m. Performance status (ECOG): 1 - Symptomatic but completely ambulatory Physical Exam Constitutional:      Appearance: Normal appearance. She is not ill-appearing.  HENT:     Mouth/Throat:     Mouth: Mucous membranes are moist.     Pharynx: Oropharynx is clear. No oropharyngeal exudate or posterior oropharyngeal erythema.  Cardiovascular:     Rate and Rhythm: Normal rate and regular rhythm.     Heart sounds: No murmur  heard.   No friction rub. No gallop.  Pulmonary:     Effort: Pulmonary effort is normal. No respiratory distress.     Breath sounds: Normal breath sounds. No wheezing, rhonchi or rales.  Abdominal:     General: Bowel sounds are normal. There is no distension.     Palpations: Abdomen is soft. There is no mass.     Tenderness: There is no abdominal tenderness.  Musculoskeletal:        General: No swelling.     Right lower leg: No edema.     Left lower leg: No edema.  Lymphadenopathy:     Cervical: No cervical adenopathy.     Upper Body:     Right upper body: No supraclavicular or axillary adenopathy.     Left upper body: No supraclavicular or axillary adenopathy.     Lower Body: No right inguinal adenopathy. No left inguinal adenopathy.  Skin:    General: Skin is warm.     Coloration: Skin is not jaundiced.     Findings: No lesion or rash.  Neurological:     General: No focal deficit present.     Mental Status: She is alert and oriented to person, place, and time. Mental status is at baseline.  Psychiatric:        Mood and Affect: Mood normal.        Behavior: Behavior normal.        Thought Content: Thought content normal.   LABS:   CBC Latest Ref Rng & Units 08/25/2021  04/25/2021 12/20/2020  WBC - 5.0 5.6 5.5  Hemoglobin 12.0 - 16.0 13.1 12.9 13.1  Hematocrit 36 - 46 40 40 39  Platelets 150 - 399 282 343 292   CMP Latest Ref Rng & Units 08/25/2021 05/26/2021 04/25/2021  Glucose 70 - 99 mg/dL - 88 -  BUN 4 - 21 17 17 14   Creatinine 0.5 - 1.1 0.5 0.55(L) 0.6  Sodium 137 - 147 141 142 138  Potassium 3.4 - 5.3 3.8 5.5(H) 4.4  Chloride 99 - 108 108 103 104  CO2 13 - 22 27(A) 26 29(A)  Calcium 8.7 - 10.7 9.1 9.8 9.7  Total Protein 6.0 - 8.5 g/dL - 6.8 -  Total Bilirubin 0.0 - 1.2 mg/dL - 0.4 -  Alkaline Phos 25 - 125 65 54 59  AST 13 - 35 24 16 24   ALT 7 - 35 15 7 11     STUDIES:  CT chest imaging reveals the following:  FINDINGS:  Cardiovascular: Heart size is normal.  There is no significant  pericardial fluid, thickening or pericardial calcification. There is  aortic atherosclerosis, as well as atherosclerosis of the great  vessels of the mediastinum and the coronary arteries, including  calcified atherosclerotic plaque in the left main, left anterior  descending, left circumflex and right coronary arteries. Right  internal jugular single-lumen porta cath with tip terminating at the  superior cavoatrial junction.  Mediastinum/Nodes: No definite pathologically enlarged mediastinal  or hilar lymph nodes. Esophagus is unremarkable in appearance. No  axillary lymphadenopathy.  Lungs/Pleura: Status post right upper lobectomy. Compensatory  hyperexpansion of the right lower lobe. Right middle lobe appears  chronically collapsed. 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 Left lung is clear. No suspicious appearing pulmonary  nodules or masses are noted. No pleural effusions. Mild diffuse  bronchial wall thickening with mild centrilobular and paraseptal  emphysema.  Upper Abdomen: Aortic atherosclerosis. Status post cholecystectomy.  Musculoskeletal: There are no aggressive appearing lytic or blastic  lesions noted in the visualized portions of the skeleton.   IMPRESSION:  1. Stable examination demonstrating no findings to suggest locally  recurrent disease or definite metastatic disease in the thorax.  2. Mild diffuse bronchial wall thickening with mild centrilobular  and paraseptal emphysema; imaging findings suggestive of underlying  COPD.  3. Aortic atherosclerosis, in addition to left main and three-vessel  coronary artery disease. Please note that although the presence of  coronary artery calcium documents the presence of coronary  artery  disease, the severity of this disease and any potential stenosis  cannot be assessed on this non-gated CT examination. Assessment for  potential risk factor modification, dietary therapy or pharmacologic  therapy may be warranted, if clinically indicated.   ASSESSMENT & PLAN:  Assessment/Plan:  A 61 y.o. female with mediastinal and right supraclavicular nodal recurrence of her lung adenocarcinoma, who completed all of her definitive therapy in March 2021.  In clinic today, I went over her CT chest images with her, for which she could see she continues to show no evidence of disease recurrence.  Clinically, the patient is doing okay. I will see this patient back in 6 months for repeat clinical assessment.  A chest x-ray will be done on the day of her next visit for her continued radiographic lung cancer surveillance.  The patient understands all the plans discussed today and is in agreement with them.     I, Rita Ohara, am acting as scribe for Marice Potter, MD    I have reviewed this report as typed by the medical scribe, and it is complete and accurate.  Temisha Murley Macarthur Critchley, MD

## 2021-08-22 ENCOUNTER — Telehealth: Payer: Self-pay

## 2021-08-22 NOTE — Telephone Encounter (Signed)
PA started on CMM for Nexletol 180 mg. Key [id number]

## 2021-08-22 NOTE — Telephone Encounter (Signed)
PA for Nexletol 180 mg from 07/16/21 until 07/15/22.

## 2021-08-23 ENCOUNTER — Telehealth: Payer: Self-pay | Admitting: Cardiology

## 2021-08-23 MED ORDER — NEXLETOL 180 MG PO TABS: 1.0000 | ORAL_TABLET | Freq: Every day | ORAL | 2 refills | Status: DC

## 2021-08-23 NOTE — Telephone Encounter (Signed)
Patient states that [facility] in [place] never got her RX for her NEXLETOL. Please resend to the [facility] asap. She's been without it for over 2 weeks.Thank you CB # [phone]

## 2021-08-23 NOTE — Telephone Encounter (Signed)
Refill sent in per request.  

## 2021-08-24 ENCOUNTER — Other Ambulatory Visit: Payer: Medicare HMO

## 2021-08-24 ENCOUNTER — Inpatient Hospital Stay: Payer: Medicare HMO | Attending: Oncology

## 2021-08-24 DIAGNOSIS — C3411 Malignant neoplasm of upper lobe, right bronchus or lung: Secondary | ICD-10-CM

## 2021-08-25 ENCOUNTER — Encounter: Payer: Self-pay | Admitting: Oncology

## 2021-08-25 ENCOUNTER — Other Ambulatory Visit: Payer: Self-pay | Admitting: Oncology

## 2021-08-25 ENCOUNTER — Encounter: Payer: Self-pay | Admitting: Hematology and Oncology

## 2021-08-25 ENCOUNTER — Inpatient Hospital Stay (INDEPENDENT_AMBULATORY_CARE_PROVIDER_SITE_OTHER): Payer: Medicare HMO | Admitting: Oncology

## 2021-08-25 ENCOUNTER — Telehealth: Payer: Self-pay | Admitting: Oncology

## 2021-08-25 ENCOUNTER — Other Ambulatory Visit: Payer: Self-pay

## 2021-08-25 VITALS — BP 109/76 | HR 93 | Temp 98.0°F | Resp 17 | Wt 132.0 lb

## 2021-08-25 DIAGNOSIS — Z902 Acquired absence of lung [part of]: Secondary | ICD-10-CM

## 2021-08-25 DIAGNOSIS — C3411 Malignant neoplasm of upper lobe, right bronchus or lung: Secondary | ICD-10-CM | POA: Diagnosis not present

## 2021-08-25 LAB — COMPREHENSIVE METABOLIC PANEL
Albumin: 3.8 (ref 3.5–5.0)
Calcium: 9.1 (ref 8.7–10.7)

## 2021-08-25 LAB — BASIC METABOLIC PANEL
BUN: 17 (ref 4–21)
CO2: 27 — AB (ref 13–22)
Chloride: 108 (ref 99–108)
Creatinine: 0.5 (ref 0.5–1.1)
Glucose: 97
Potassium: 3.8 (ref 3.4–5.3)
Sodium: 141 (ref 137–147)

## 2021-08-25 LAB — CBC AND DIFFERENTIAL
HCT: 40 (ref 36–46)
Hemoglobin: 13.1 (ref 12.0–16.0)
Neutrophils Absolute: 3.55
Platelets: 282 (ref 150–399)
WBC: 5

## 2021-08-25 LAB — CBC: RBC: 4.61 (ref 3.87–5.11)

## 2021-08-25 LAB — HEPATIC FUNCTION PANEL
ALT: 15 (ref 7–35)
AST: 24 (ref 13–35)
Alkaline Phosphatase: 65 (ref 25–125)
Bilirubin, Total: 0.8

## 2021-08-25 NOTE — Telephone Encounter (Signed)
Patient has been scheduled for follow-up visit per 08/25/21 los. Pt given an appt calendar with date and time along with CXR order.

## 2021-08-28 ENCOUNTER — Other Ambulatory Visit: Payer: Self-pay

## 2021-08-28 ENCOUNTER — Encounter: Payer: Self-pay | Admitting: Oncology

## 2021-08-30 ENCOUNTER — Telehealth: Payer: Self-pay | Admitting: Pulmonary Disease

## 2021-08-30 NOTE — Telephone Encounter (Signed)
Called and spoke with patient to let her know of Bronch information. She expressed understanding. Advised her I would see if she could do covid test the morning of since she does not live local. Will call her back

## 2021-08-30 NOTE — Telephone Encounter (Signed)
I have reviewed her CT scan from February at [facility]. No cause for cough identified. No evidence of recurrent malignancy. 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   Discussed with patient and her husband Merry Proud.  They are willing to proceed with bronchoscopy as discussed during office visit.  Plan to do this at [facility]  I have scheduled the procedure at [facility] on 2/28 at 12:15 PM.  Please let patient know to hold Plavix for 5 days prior to procedure  Please schedule the following:  Provider performing procedure:Mellissa Conley Diagnosis: chronic cough Which side if for nodule / mass? Right Procedure: Bronchoscopy Has patient been spoken to by Provider and given informed consent?  Yes Anesthesia: Propofol sedation Do you need Fluro?  No Duration of procedure: 30 minutes Date: 2/28 Alternate Date: 3/1 or 3/2 Time: AM or PM Location: [facility] Does patient have OSA?  No DM?  No or Latex allergy?  No Medication Restriction: Hold Plavix for 5 days Anticoagulate/Antiplatelet: Hold Plavix for 5 days prior Pre-op Labs Ordered:determined by Anesthesia Imaging request: N/A   Please coordinate Pre-op COVID Testing

## 2021-09-04 ENCOUNTER — Encounter (HOSPITAL_COMMUNITY): Payer: Self-pay | Admitting: Pulmonary Disease

## 2021-09-04 NOTE — Telephone Encounter (Signed)
Per Vista Lawman she can get covid test same day as procedure she will need to be here 3 hours prior to procedure

## 2021-09-04 NOTE — Telephone Encounter (Signed)
Called and spoke with patient's husband per DPR to let him know that she can have covid test the same day as procedure but she needs to be there 3 hours early. So she will need to be at the hospital 9-9:15. He expressed understanding. Nothing further needed at this time.

## 2021-09-12 ENCOUNTER — Ambulatory Visit (HOSPITAL_COMMUNITY)
Admission: RE | Admit: 2021-09-12 | Discharge: 2021-09-12 | Disposition: A | Discharge status: Home / Self Care | Payer: Medicare HMO | Attending: Pulmonary Disease | Admitting: Pulmonary Disease

## 2021-09-12 ENCOUNTER — Ambulatory Visit (HOSPITAL_BASED_OUTPATIENT_CLINIC_OR_DEPARTMENT_OTHER): Payer: Medicare HMO | Admitting: Certified Registered"

## 2021-09-12 ENCOUNTER — Encounter (HOSPITAL_COMMUNITY)
Admission: RE | Disposition: A | Discharge status: Home / Self Care | Payer: Self-pay | Source: Home / Self Care | Attending: Pulmonary Disease

## 2021-09-12 ENCOUNTER — Encounter (HOSPITAL_COMMUNITY): Payer: Self-pay | Admitting: Pulmonary Disease

## 2021-09-12 ENCOUNTER — Ambulatory Visit (HOSPITAL_COMMUNITY): Payer: Medicare HMO | Admitting: Certified Registered"

## 2021-09-12 DIAGNOSIS — Z20822 Contact with and (suspected) exposure to covid-19: Secondary | ICD-10-CM | POA: Diagnosis not present

## 2021-09-12 DIAGNOSIS — Z85118 Personal history of other malignant neoplasm of bronchus and lung: Secondary | ICD-10-CM | POA: Diagnosis not present

## 2021-09-12 DIAGNOSIS — Z79899 Other long term (current) drug therapy: Secondary | ICD-10-CM | POA: Insufficient documentation

## 2021-09-12 DIAGNOSIS — Z7951 Long term (current) use of inhaled steroids: Secondary | ICD-10-CM | POA: Diagnosis not present

## 2021-09-12 DIAGNOSIS — I25119 Atherosclerotic heart disease of native coronary artery with unspecified angina pectoris: Secondary | ICD-10-CM | POA: Diagnosis not present

## 2021-09-12 DIAGNOSIS — Z87891 Personal history of nicotine dependence: Secondary | ICD-10-CM | POA: Insufficient documentation

## 2021-09-12 DIAGNOSIS — J701 Chronic and other pulmonary manifestations due to radiation: Secondary | ICD-10-CM | POA: Diagnosis not present

## 2021-09-12 DIAGNOSIS — Z955 Presence of coronary angioplasty implant and graft: Secondary | ICD-10-CM | POA: Insufficient documentation

## 2021-09-12 DIAGNOSIS — Z923 Personal history of irradiation: Secondary | ICD-10-CM | POA: Diagnosis not present

## 2021-09-12 DIAGNOSIS — I252 Old myocardial infarction: Secondary | ICD-10-CM | POA: Insufficient documentation

## 2021-09-12 DIAGNOSIS — R053 Chronic cough: Secondary | ICD-10-CM | POA: Insufficient documentation

## 2021-09-12 DIAGNOSIS — M1990 Unspecified osteoarthritis, unspecified site: Secondary | ICD-10-CM | POA: Insufficient documentation

## 2021-09-12 DIAGNOSIS — Z902 Acquired absence of lung [part of]: Secondary | ICD-10-CM | POA: Insufficient documentation

## 2021-09-12 DIAGNOSIS — J432 Centrilobular emphysema: Secondary | ICD-10-CM | POA: Insufficient documentation

## 2021-09-12 DIAGNOSIS — R059 Cough, unspecified: Secondary | ICD-10-CM

## 2021-09-12 DIAGNOSIS — Z9221 Personal history of antineoplastic chemotherapy: Secondary | ICD-10-CM | POA: Insufficient documentation

## 2021-09-12 DIAGNOSIS — J449 Chronic obstructive pulmonary disease, unspecified: Secondary | ICD-10-CM

## 2021-09-12 DIAGNOSIS — I2510 Atherosclerotic heart disease of native coronary artery without angina pectoris: Secondary | ICD-10-CM | POA: Insufficient documentation

## 2021-09-12 HISTORY — PX: BRONCHIAL BRUSHINGS: SHX5108

## 2021-09-12 HISTORY — PX: VIDEO BRONCHOSCOPY: SHX5072

## 2021-09-12 HISTORY — PX: BRONCHIAL WASHINGS: SHX5105

## 2021-09-12 LAB — SARS CORONAVIRUS 2 BY RT PCR (HOSPITAL ORDER, PERFORMED IN ~~LOC~~ HOSPITAL LAB): SARS Coronavirus 2: NEGATIVE

## 2021-09-12 SURGERY — VIDEO BRONCHOSCOPY WITHOUT FLUORO
Anesthesia: General

## 2021-09-12 MED ORDER — LIDOCAINE HCL 1 % IJ SOLN: INTRAMUSCULAR | Status: DC | PRN

## 2021-09-12 MED ORDER — SUCCINYLCHOLINE CHLORIDE 200 MG/10ML IV SOSY
PREFILLED_SYRINGE | INTRAVENOUS | Status: DC | PRN
  Administered 2021-09-12: 100 mg via INTRAVENOUS

## 2021-09-12 MED ORDER — LIDOCAINE HCL URETHRAL/MUCOSAL 2 % EX GEL
CUTANEOUS | Status: DC | PRN
  Administered 2021-09-12: 1

## 2021-09-12 MED ORDER — BUTAMBEN-TETRACAINE-BENZOCAINE 2-2-14 % EX AERO: 1.0000 | INHALATION_SPRAY | Freq: Once | CUTANEOUS | Status: DC

## 2021-09-12 MED ORDER — LIDOCAINE HCL URETHRAL/MUCOSAL 2 % EX GEL
CUTANEOUS | Status: AC
  Filled 2021-09-12: qty 30

## 2021-09-12 MED ORDER — PROPOFOL 10 MG/ML IV BOLUS
INTRAVENOUS | Status: DC | PRN
  Administered 2021-09-12: 70 mg via INTRAVENOUS
  Administered 2021-09-12: 10 mg via INTRAVENOUS
  Administered 2021-09-12: 20 mg via INTRAVENOUS

## 2021-09-12 MED ORDER — PROPOFOL 10 MG/ML IV BOLUS
INTRAVENOUS | Status: AC
  Filled 2021-09-12: qty 20

## 2021-09-12 MED ORDER — LIDOCAINE 2% (20 MG/ML) 5 ML SYRINGE
INTRAMUSCULAR | Status: DC | PRN
  Administered 2021-09-12: 40 mg via INTRAVENOUS

## 2021-09-12 MED ORDER — LIDOCAINE HCL URETHRAL/MUCOSAL 2 % EX GEL: 1.0000 "application " | Freq: Once | CUTANEOUS | Status: DC

## 2021-09-12 MED ORDER — ONDANSETRON HCL 4 MG/2ML IJ SOLN
INTRAMUSCULAR | Status: DC | PRN
  Administered 2021-09-12: 4 mg via INTRAVENOUS

## 2021-09-12 MED ORDER — LIDOCAINE HCL 1 % IJ SOLN
INTRAMUSCULAR | Status: AC
  Filled 2021-09-12: qty 20

## 2021-09-12 MED ORDER — PROPOFOL 1000 MG/100ML IV EMUL
INTRAVENOUS | Status: AC
  Filled 2021-09-12: qty 100

## 2021-09-12 MED ORDER — LACTATED RINGERS IV SOLN
INTRAVENOUS | Status: AC | PRN
Start: 2021-09-12 — End: 2021-09-12
  Administered 2021-09-12: 10 mL/h via INTRAVENOUS

## 2021-09-12 MED ORDER — FENTANYL CITRATE (PF) 100 MCG/2ML IJ SOLN
INTRAMUSCULAR | Status: AC
  Filled 2021-09-12: qty 2

## 2021-09-12 MED ORDER — PROPOFOL 500 MG/50ML IV EMUL
INTRAVENOUS | Status: DC | PRN
Start: 2021-09-12 — End: 2021-09-12
  Administered 2021-09-12: 150 ug/kg/min via INTRAVENOUS

## 2021-09-12 NOTE — Op Note (Signed)
Indication : Chronic cough in this ex smoker with history of right upper lobectomy and radiation therapy for lung cancer.   Written informed consent was obtained prior to the procedure. The risks of the procedure including coughing, bleeding and the small chance of lung puncture requiring chest tube were discussed in great detail. The benefits & alternatives including serial follow up were also discussed.  Sedation/general anesthesia was provided by anesthesiology Bronchoscope entered from the right nare. Upper airway appeared normal. Vocal cords showed nml appearance & motion.  Arytenoids appeared swollen posteriorly likely from chronic cough.  Following this patient was intubated and bronchoscope was inserted via ET tube Trachea & bronchial tree examined to the subsegmental level. Right upper lobectomy stump appeared scarred.  No endobronchial lesions were noted Mild amount of white mucoid secretions were noted. No endobronchial lesions seen. BAL was obtained from right middle and right lower lobes Brushings was obtained from the stump   Patient tolerated procedure well and was extubated in the endoscopy room and taken to recovery  Specimen sent: BAL for micro, AFB, fungal, cytology Brushings for cytology  Dala Breault V.  [phone]

## 2021-09-12 NOTE — Anesthesia Preprocedure Evaluation (Addendum)
Anesthesia Evaluation  Patient identified by MRN, date of birth, ID band Patient awake    Reviewed: Allergy & Precautions, H&P , NPO status , Patient's Chart, lab work & pertinent test results  History of Anesthesia Complications (+) PONV and history of anesthetic complications  Airway Mallampati: III  TM Distance: >3 FB Neck ROM: Full    Dental no notable dental hx. (+) Edentulous Upper, Partial Lower, Dental Advisory Given   Pulmonary COPD,  COPD inhaler, former smoker,    Pulmonary exam normal breath sounds clear to auscultation       Cardiovascular + angina + CAD and + Past MI   Rhythm:Regular Rate:Normal     Neuro/Psych negative neurological ROS  negative psych ROS   GI/Hepatic negative GI ROS, Neg liver ROS,   Endo/Other  negative endocrine ROS  Renal/GU negative Renal ROS  negative genitourinary   Musculoskeletal  (+) Arthritis , Osteoarthritis,    Abdominal   Peds  Hematology  (+) Blood dyscrasia, anemia ,   Anesthesia Other Findings   Reproductive/Obstetrics negative OB ROS                            Anesthesia Physical Anesthesia Plan  ASA: 3  Anesthesia Plan: MAC   Post-op Pain Management: Minimal or no pain anticipated   Induction: Intravenous  PONV Risk Score and Plan: 4 or greater and 3 and Ondansetron, Dexamethasone and Propofol infusion  Airway Management Planned: Simple Face Mask  Additional Equipment:   Intra-op Plan:   Post-operative Plan:   Informed Consent: I have reviewed the patients History and Physical, chart, labs and discussed the procedure including the risks, benefits and alternatives for the proposed anesthesia with the patient or authorized representative who has indicated his/her understanding and acceptance.     Dental advisory given  Plan Discussed with: CRNA  Anesthesia Plan Comments:        Anesthesia Quick Evaluation

## 2021-09-12 NOTE — Anesthesia Procedure Notes (Signed)
Procedure Name: Intubation Date/Time: 09/12/2021 12:59 PM Performed by: Eben Burow, CRNA Pre-anesthesia Checklist: Patient identified, Emergency Drugs available, Suction available, Patient being monitored and Timeout performed Patient Re-evaluated:Patient Re-evaluated prior to induction Oxygen Delivery Method: Circle system utilized Preoxygenation: Pre-oxygenation with 100% oxygen Induction Type: IV induction Ventilation: Mask ventilation without difficulty Laryngoscope Size: Mac and 4 Grade View: Grade I Tube type: Oral Tube size: 8.0 mm Number of attempts: 1 Airway Equipment and Method: Stylet Placement Confirmation: ETT inserted through vocal cords under direct vision, positive ETCO2 and breath sounds checked- equal and bilateral Secured at: 20 cm Tube secured with: Tape Dental Injury: Teeth and Oropharynx as per pre-operative assessment

## 2021-09-12 NOTE — Interval H&P Note (Signed)
History and Physical Interval Note:  09/12/2021 12:34 PM  Michelle King  has presented today for surgery, with the diagnosis of chronic cough, hx lung CA.  The various methods of treatment have been discussed with the patient and family. After consideration of risks, benefits and other options for treatment, the patient has consented to  Procedure(s): VIDEO BRONCHOSCOPY WITHOUT FLUORO (N/A) as a surgical intervention.  The patient's history has been reviewed, patient examined, no change in status, stable for surgery.  I have reviewed the patient's chart and labs.  Questions were answered to the patient's satisfaction.     Leanna Sato Elsworth Soho

## 2021-09-12 NOTE — Anesthesia Procedure Notes (Signed)
Procedure Name: MAC Date/Time: 09/12/2021 12:40 PM Performed by: Eben Burow, CRNA Pre-anesthesia Checklist: Patient identified, Emergency Drugs available, Suction available, Patient being monitored and Timeout performed Oxygen Delivery Method: Simple face mask Placement Confirmation: positive ETCO2

## 2021-09-12 NOTE — Anesthesia Postprocedure Evaluation (Signed)
Anesthesia Post Note  Patient: Michelle King  Procedure(s) Performed: VIDEO BRONCHOSCOPY WITHOUT FLUORO BRONCHIAL BRUSHINGS BRONCHIAL WASHINGS     Patient location during evaluation: Endoscopy Anesthesia Type: General Level of consciousness: awake and alert Pain management: pain level controlled Vital Signs Assessment: post-procedure vital signs reviewed and stable Respiratory status: spontaneous breathing, nonlabored ventilation and respiratory function stable Cardiovascular status: blood pressure returned to baseline and stable Postop Assessment: no apparent nausea or vomiting Anesthetic complications: no   No notable events documented.  Last Vitals:  Vitals:   09/12/21 1345 09/12/21 1355  BP: 128/71   Pulse: 80 82  Resp: 15 13  Temp:    SpO2: 100% 92%    Last Pain:  Vitals:   09/12/21 1345  TempSrc:   PainSc: 0-No pain                 Keiera Strathman,W. EDMOND

## 2021-09-12 NOTE — Transfer of Care (Signed)
Immediate Anesthesia Transfer of Care Note  Patient: Michelle King  Procedure(s) Performed: VIDEO BRONCHOSCOPY WITHOUT FLUORO BRONCHIAL BRUSHINGS BRONCHIAL WASHINGS  Patient Location: PACU and Endoscopy Unit  Anesthesia Type:General  Level of Consciousness: awake, alert  and patient cooperative  Airway & Oxygen Therapy: Patient Spontanous Breathing and Patient connected to face mask oxygen  Post-op Assessment: Report given to RN and Post -op Vital signs reviewed and stable  Post vital signs: Reviewed and stable  Last Vitals:  Vitals Value Taken Time  BP    Temp    Pulse 94 09/12/21 1323  Resp 18 09/12/21 1323  SpO2 100 % 09/12/21 1323  Vitals shown include unvalidated device data.  Last Pain:  Vitals:   09/12/21 1103  TempSrc: Temporal  PainSc: 0-No pain         Complications: No notable events documented.

## 2021-09-13 ENCOUNTER — Encounter (HOSPITAL_COMMUNITY): Payer: Self-pay | Admitting: Pulmonary Disease

## 2021-09-13 LAB — CYTOLOGY - NON PAP

## 2021-09-14 LAB — FUNGAL STAIN REFLEX

## 2021-09-14 LAB — CULTURE, RESPIRATORY W GRAM STAIN
Culture: NORMAL
Gram Stain: NONE SEEN

## 2021-09-14 LAB — FUNGUS STAIN

## 2021-09-18 LAB — ACID FAST SMEAR (AFB, MYCOBACTERIA): Acid Fast Smear: NEGATIVE

## 2021-10-17 LAB — FUNGUS CULTURE RESULT

## 2021-10-17 LAB — FUNGAL ORGANISM REFLEX

## 2021-10-17 LAB — FUNGUS CULTURE WITH STAIN

## 2021-10-18 DIAGNOSIS — K219 Gastro-esophageal reflux disease without esophagitis: Secondary | ICD-10-CM

## 2021-10-18 HISTORY — DX: Gastro-esophageal reflux disease without esophagitis: K21.9

## 2021-10-30 LAB — ACID FAST CULTURE WITH REFLEXED SENSITIVITIES (MYCOBACTERIA): Acid Fast Culture: NEGATIVE

## 2021-11-13 ENCOUNTER — Encounter: Payer: Self-pay | Admitting: Hematology and Oncology

## 2021-11-20 ENCOUNTER — Encounter: Payer: Self-pay | Admitting: Pulmonary Disease

## 2021-11-20 ENCOUNTER — Ambulatory Visit (INDEPENDENT_AMBULATORY_CARE_PROVIDER_SITE_OTHER): Payer: Medicare HMO | Admitting: Pulmonary Disease

## 2021-11-20 DIAGNOSIS — R053 Chronic cough: Secondary | ICD-10-CM | POA: Diagnosis not present

## 2021-11-20 DIAGNOSIS — C3411 Malignant neoplasm of upper lobe, right bronchus or lung: Secondary | ICD-10-CM

## 2021-11-20 MED ORDER — SYMBICORT 160-4.5 MCG/ACT IN AERO
2.0000 | INHALATION_SPRAY | RESPIRATORY_TRACT | 3 refills | Status: DC | PRN
Start: 2021-11-20 — End: 2023-08-29

## 2021-11-20 MED ORDER — ALBUTEROL SULFATE HFA 108 (90 BASE) MCG/ACT IN AERS: 2.0000 | INHALATION_SPRAY | RESPIRATORY_TRACT | 4 refills | Status: AC | PRN

## 2021-11-20 NOTE — Progress Notes (Signed)
? ?  Subjective:  ? ? Patient ID: Michelle King, female    DOB: [birth year], 61 y.o.   MRN: [id number] ? ?HPI ? ?3 y o ex-smoker with a history of lung cancer for FU of chronic cough x 5 y ?  ?PMH -CAD post stents, emphysema, restless leg syndrome ?-On Dupixent for atopic dermatitis ? ?She underwent right upper lobectomy by Dr. Servando Snare in 2016.  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 ?She had recurrence in the mediastinum  with right supraclavicular lymphadenopathy and underwent chemoradiation and completed 1 year of maintenance durvalumab in March 2021.   ? ?She was maintained on Breo and Symbicort which she takes alternately on an as-needed basis.  Tessalon Perles have not helped ?Initial OV >> trial of prednisone , tessalon perles, PPI sequentially  ? She is on gabapentin and tramadol for chronic pain ?  ?She quit smoking in 2016, more than 40 pack years ? ?We undertook trial of Chlor-Trimeton 4 mg at bedtime for 3 to 4 weeks OTC for postnasal drip >> no relief ?-Trial of prednisone, no relief ?Bronchoscopy was nondiagnostic. ?She saw ENT at [facility], reviewed their consultation, CT sinuses showed left maxillary sinus thickening, given clindamycin 3 times daily.  She developed closing of her throat and swelling of her fingers, this was changed to Augmentin which she took for 7 days with remarkable improvement in her cough. ?Husband states that cough is 90% improved ?She increased dose of gabapentin ? ? ?Significant tests/ events reviewed ? ?BAL 08/2021 neg afb, fungal ?CT chest 12/2020 -postradiation scarring in the right apex and suprahilar right lung, underlying centrilobular emphysema, nodular foci on left has resolved ?  ?02/2015 PFTs showed no evidence of airway obstruction with ratio of 75, FEV1 100%, FVC 102%, DLCO 74% ? ? ? ? ? ? ?Review of Systems ?neg for any  significant sore throat, dysphagia, itching, sneezing, nasal congestion or excess/ purulent secretions, fever, chills, sweats, unintended wt loss, pleuritic or exertional cp, hempoptysis, orthopnea pnd or change in chronic leg swelling. Also denies presyncope, palpitations, heartburn, abdominal pain, nausea, vomiting, diarrhea or change in bowel or urinary habits, dysuria,hematuria, rash, arthralgias, visual complaints, headache, numbness weakness or ataxia. ? ?   ?Objective:  ? Physical Exam ? ?Gen. Pleasant, well-nourished, in no distress ?ENT - no thrush, no pallor/icterus,no post nasal drip ?Neck: No JVD, no thyromegaly, no carotid bruits ?Lungs: no use of accessory muscles, no dullness to percussion, clear without rales or rhonchi  ?Cardiovascular: Rhythm regular, heart sounds  normal, no murmurs or gallops, no peripheral edema ?Musculoskeletal: No deformities, no cyanosis or clubbing  ? ? ? ?   ?Assessment & Plan:  ? ? ?

## 2021-11-20 NOTE — Assessment & Plan Note (Signed)
She had remarkable improvement after course of Augmentin.  Left maxillary sinus thickening was noted on CT.  It is conceivable that chronic sinusitis was treated and postnasal drip improved after course of antibiotics. ?If cough returns we will treat postnasal drip more aggressively with chlorpheniramine/pseudoephedrine combination over-the-counter and if this does not provide any relief can undertake another trial of Augmentin for 14 days. ? ?

## 2021-11-20 NOTE — Assessment & Plan Note (Signed)
She will continue her 6 monthly surveillance CT scans per oncology ?

## 2021-11-20 NOTE — Patient Instructions (Signed)
?  X refills on symbicort ? ?If cough returns, take [facility] brand sinus PE x 1 week & call for antibiotic if cough persists ?

## 2022-01-12 DIAGNOSIS — L280 Lichen simplex chronicus: Secondary | ICD-10-CM | POA: Insufficient documentation

## 2022-01-12 HISTORY — DX: Lichen simplex chronicus: L28.0

## 2022-02-21 NOTE — Progress Notes (Unsigned)
[facility]  [address] [place],  [place]  [address] [phone]  Clinic Day:  02/22/2022  Referring physician: Charlotte Sanes, MD  HISTORY OF PRESENT ILLNESS:  The patient is a 61 y.o. female with mediastinal and right supraclavicular nodal recurrence of her previous lung adenocarcinoma.  She did undergo chemoradiation before completing 1 year of maintenance durvalumab for her disease recurrence in March 2021.  She comes in today to review her chest x-ray done today as part of her continued radiographic lung cancer surveillance.  Since her last visit, the patient has been doing better.  She claims her coughing is nowhere as prominent as it was previously.  She denies having progressive shortness of breath, hemoptysis, or any other respiratory symptoms which concern her for possible disease recurrence.   PHYSICAL EXAM:  Blood pressure 126/73, pulse 83, temperature 98.1 F (36.7 C), resp. rate 16, height 5\' 1"  (1.549 m), weight 133 lb 9.6 oz (60.6 kg), SpO2 98 %. Wt Readings from Last 3 Encounters:  02/22/22 133 lb 9.6 oz (60.6 kg)  11/20/21 132 lb 9.6 oz (60.1 kg)  09/12/21 132 lb (59.9 kg)   Body mass index is 25.24 kg/m. Performance status (ECOG): 1 - Symptomatic but completely ambulatory Physical Exam Constitutional:      Appearance: Normal appearance. She is not ill-appearing.  HENT:     Mouth/Throat:     Mouth: Mucous membranes are moist.     Pharynx: Oropharynx is clear. No oropharyngeal exudate or posterior oropharyngeal erythema.  Cardiovascular:     Rate and Rhythm: Normal rate and regular rhythm.     Heart sounds: No murmur heard.    No friction rub. No gallop.  Pulmonary:     Effort: Pulmonary effort is normal. No respiratory distress.     Breath sounds: Normal breath sounds. No wheezing, rhonchi or rales.  Abdominal:     General: Bowel sounds are normal. There is no distension.     Palpations: Abdomen is soft. There is no mass.      Tenderness: There is no abdominal tenderness.  Musculoskeletal:        General: No swelling.     Right lower leg: No edema.     Left lower leg: No edema.  Lymphadenopathy:     Cervical: No cervical adenopathy.     Upper Body:     Right upper body: No supraclavicular or axillary adenopathy.     Left upper body: No supraclavicular or axillary adenopathy.     Lower Body: No right inguinal adenopathy. No left inguinal adenopathy.  Skin:    General: Skin is warm.     Coloration: Skin is not jaundiced.     Findings: No lesion or rash.  Neurological:     General: No focal deficit present.     Mental Status: She is alert and oriented to person, place, and time. Mental status is at baseline.  Psychiatric:        Mood and Affect: Mood normal.        Behavior: Behavior normal.        Thought Content: Thought content normal.    LABS:      Latest Ref Rng & Units 02/22/2022   12:00 AM 08/24/2021   12:00 AM 04/25/2021   12:00 AM  CBC  WBC  5.3     5.0  5.6      Hemoglobin 12.0 - 16.0 12.7     13.1  12.9  Hematocrit 36 - 46 37     40  40      Platelets 150 - 400 K/uL 309     282  343         This result is from an external source.      Latest Ref Rng & Units 02/22/2022   12:00 AM 08/25/2021   12:00 AM 05/26/2021   10:45 AM  CMP  Glucose 70 - 99 mg/dL   88   BUN 4 - 21 24     17  17    Creatinine 0.5 - 1.1 0.5     0.5  0.55   Sodium 137 - 147 141     141  142   Potassium 3.5 - 5.1 mEq/L 4.1     3.8  5.5   Chloride 99 - 108 107     108  103   CO2 13 - 22 28     27  26    Calcium 8.7 - 10.7 9.0     9.1  9.8   Total Protein 6.0 - 8.5 g/dL   6.8   Total Bilirubin 0.0 - 1.2 mg/dL   0.4   Alkaline Phos 25 - 125 49     65  54   AST 13 - 35 28     24  16    ALT 7 - 35 U/L 15     15  7       This result is from an external source.    STUDIES:  Her chest x-ray revealed the following:  FINDINGS: Stable present right anterior chest wall Port-A-Cath tip projects over the superior  vena cava. Stable appearance of the right hemithorax. Left lung is well aerated. No pleural effusion or pneumothorax. Thoracic spine degenerative changes.  IMPRESSION: Stable appearance of the right hemithorax. No acute process.  ASSESSMENT & PLAN:  Assessment/Plan:  61 y.o. female with mediastinal and right supraclavicular nodal recurrence of her lung adenocarcinoma, who completed all of her definitive therapy in March 2021.  In clinic today, I went over her chest x-ray images with her, for which she could see she continues to show no evidence of disease recurrence.  Clinically, the patient is doing well. I will see this patient back in 6 months for repeat clinical assessment.  A chest CT will be a day before her next visit for her continued radiographic lung cancer surveillance.  The patient understands all the plans discussed today and is in agreement with them.    Priest Lockridge Macarthur Critchley, MD

## 2022-02-22 ENCOUNTER — Other Ambulatory Visit: Payer: Self-pay | Admitting: Oncology

## 2022-02-22 ENCOUNTER — Inpatient Hospital Stay: Payer: Medicare HMO

## 2022-02-22 ENCOUNTER — Inpatient Hospital Stay: Payer: Medicare HMO | Attending: Oncology | Admitting: Oncology

## 2022-02-22 VITALS — BP 126/73 | HR 83 | Temp 98.1°F | Resp 16 | Ht 61.0 in | Wt 133.6 lb

## 2022-02-22 DIAGNOSIS — Z902 Acquired absence of lung [part of]: Secondary | ICD-10-CM

## 2022-02-22 DIAGNOSIS — C3411 Malignant neoplasm of upper lobe, right bronchus or lung: Secondary | ICD-10-CM

## 2022-02-22 LAB — CBC AND DIFFERENTIAL
HCT: 37 (ref 36–46)
Hemoglobin: 12.7 (ref 12.0–16.0)
Neutrophils Absolute: 3.55
Platelets: 309 10*3/uL (ref 150–400)
WBC: 5.3

## 2022-02-22 LAB — COMPREHENSIVE METABOLIC PANEL
Albumin: 4.2 (ref 3.5–5.0)
Calcium: 9 (ref 8.7–10.7)

## 2022-02-22 LAB — BASIC METABOLIC PANEL
BUN: 24 — AB (ref 4–21)
CO2: 28 — AB (ref 13–22)
Chloride: 107 (ref 99–108)
Creatinine: 0.5 (ref 0.5–1.1)
Glucose: 91
Potassium: 4.1 mEq/L (ref 3.5–5.1)
Sodium: 141 (ref 137–147)

## 2022-02-22 LAB — CBC: RBC: 4.31 (ref 3.87–5.11)

## 2022-02-22 LAB — HEPATIC FUNCTION PANEL
ALT: 15 U/L (ref 7–35)
AST: 28 (ref 13–35)
Alkaline Phosphatase: 49 (ref 25–125)
Bilirubin, Total: 0.8

## 2022-03-12 ENCOUNTER — Encounter: Payer: Self-pay | Admitting: Oncology

## 2022-05-20 ENCOUNTER — Other Ambulatory Visit: Payer: Self-pay | Admitting: Cardiology

## 2022-05-21 NOTE — Telephone Encounter (Signed)
Rx refill sent to pharmacy. 

## 2022-05-30 ENCOUNTER — Ambulatory Visit: Payer: Medicare HMO | Attending: Cardiology | Admitting: Cardiology

## 2022-05-30 ENCOUNTER — Encounter: Payer: Self-pay | Admitting: Cardiology

## 2022-05-30 VITALS — BP 118/70 | HR 72 | Ht 61.0 in | Wt 131.0 lb

## 2022-05-30 DIAGNOSIS — I25119 Atherosclerotic heart disease of native coronary artery with unspecified angina pectoris: Secondary | ICD-10-CM | POA: Diagnosis not present

## 2022-05-30 DIAGNOSIS — E782 Mixed hyperlipidemia: Secondary | ICD-10-CM

## 2022-05-30 DIAGNOSIS — J449 Chronic obstructive pulmonary disease, unspecified: Secondary | ICD-10-CM | POA: Diagnosis not present

## 2022-05-30 DIAGNOSIS — C3411 Malignant neoplasm of upper lobe, right bronchus or lung: Secondary | ICD-10-CM

## 2022-05-30 DIAGNOSIS — Z789 Other specified health status: Secondary | ICD-10-CM

## 2022-05-30 NOTE — Progress Notes (Signed)
Cardiology Office Note:    Date:  05/30/2022   ID:  Michelle King, DOB [birth year], MRN [id number]  PCP:  Charlotte Sanes, MD  Cardiologist:  Shirlee More, MD    Referring MD: Charlotte Sanes, MD    ASSESSMENT:    1. Coronary artery disease involving native coronary artery of native heart with angina pectoris ([place])   2. Mixed hyperlipidemia   3. Statin intolerance   4. Chronic obstructive pulmonary disease, unspecified COPD type ([place])   5. Malignant neoplasm of upper lobe of right lung ([facility])    PLAN:    In order of problems listed above:  She continues to do exceptionally well with her CAD she is no anginal discomfort remote from PCI and with baseline severe cardiomyopathy has normalized function.  She has done well with medical therapy and will continue clopidogrel antiplatelet lipid-lowering with bempedoic acid she is absolutely statin intolerant and is on no antianginal therapy or antihypertensives at this time with previous symptomatic hypotension. COPD is improved she will continue her current bronchodilator Stable lung cancer no clinical recurrence followed by oncology   Next appointment: 1 year   Medication Adjustments/Labs and Tests Ordered: Current medicines are reviewed at length with the patient today.  Concerns regarding medicines are outlined above.  Orders Placed This Encounter  Procedures   Lipid panel   Lipoprotein A (LPA)   No orders of the defined types were placed in this encounter.   Chief complaint follow-up for CAD and cardiomyopathy  History of Present Illness:    Michelle King is a 61 y.o. female with a hx of  coronary artery disease with ST elevation MI PCI and stent of proximal LAD in 2010 with severe ischemic cardiomyopathy and subsequent recovery of ejection fraction with guideline directed therapy, COPD lung cancer dyslipidemia statin intolerance and chronic urticaria.  She continues to follow with oncology with  recurrence that was treated with chemoradiation last seen 05/26/2021.  Compliance with diet, lifestyle and medications: Yes  I was unaware she had been at [facility] emergency room seen 05/14/2022 she presented with posterior pleuritic or renal colic symptoms she had a CT scan which showed no pathology in the chest and no obstructive uropathy or obvious calculus the symptoms resolved quickly and have not recurred.  Speaking to her I think she had renal colic.  Other evaluation in the emergency room showed a CMP with a potassium of 3.7 troponin undetectable proBNP low at 132 creatinine 0.4 CBC was normal including white count 6300 and differential.  Her EKG independently reviewed by me shows sinus rhythm left axis deviation low voltage otherwise normal.  She is continues to follow with oncology and shows no recurrence of her metastatic lung cancer  She continues to improve she is not having trouble with cough shortness of breath edema chest pain palpitation or syncope She had a history of statin without muscle pain or weakness  Past Medical History:  Diagnosis Date   Angio-edema 09/05/2016   Arthritis    spine   CAD (coronary artery disease)    Cancer ([place])    lung   Chronic cholecystitis 04/22/2019   Chronic idiopathic urticaria 19/37/9024   Complication of anesthesia    COPD (chronic obstructive pulmonary disease) ([place])    Coronary artery disease involving native coronary artery of native heart with angina pectoris ([place])    Overview:  Anterior MI 2010 with PCI and stent Her last stress test pharmacologic showed normal ejection fraction and no  evidence of ischemia. She has a history of previous LAD infarction with severely depressed ejection fraction that normalized with medical therapy and revascularization percutaneously.   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   Pruritus 02/26/2021   Pure hypercholesterolemia 03/01/2015   Restless legs 03/11/2018   S/P hysterectomy    S/P lobectomy of lung 03/25/2015   Statin intolerance 03/10/2018    Past Surgical History:  Procedure Laterality Date   APPENDECTOMY     BRONCHIAL BRUSHINGS  09/12/2021   Procedure: BRONCHIAL BRUSHINGS;  Surgeon: Rigoberto Noel, MD;  Location: [facility];  Service: Cardiopulmonary;;   BRONCHIAL WASHINGS  09/12/2021   Procedure: BRONCHIAL WASHINGS;  Surgeon: Rigoberto Noel, MD;  Location: [facility];  Service: Cardiopulmonary;;   CARDIAC CATHETERIZATION     stents   COLONOSCOPY N/A 04/18/2015   Procedure: COLONOSCOPY;  Surgeon: Ladene Artist, MD;  Location: [facility];  Service: Endoscopy;  Laterality: N/A;   MOUTH SURGERY     teeth   TUBAL LIGATION     VIDEO ASSISTED THORACOSCOPY (VATS)/WEDGE RESECTION Right 03/25/2015   Procedure: VIDEO ASSISTED THORACOSCOPY (VATS)/LUNG RESECTION;  Surgeon: Grace Isaac, MD;  Location: [facility];  Service: Thoracic;  Laterality: Right;   VIDEO BRONCHOSCOPY N/A 03/25/2015   Procedure: VIDEO BRONCHOSCOPY;  Surgeon: Grace Isaac, MD;  Location: [facility];  Service: Thoracic;  Laterality: N/A;   VIDEO BRONCHOSCOPY N/A 09/12/2021   Procedure: VIDEO BRONCHOSCOPY WITHOUT FLUORO;  Surgeon: Rigoberto Noel, MD;  Location: [facility];  Service: Cardiopulmonary;  Laterality: N/A;   VIDEO BRONCHOSCOPY WITH ENDOBRONCHIAL ULTRASOUND N/A 04/18/2018   Procedure: VIDEO BRONCHOSCOPY WITH ENDOBRONCHIAL ULTRASOUND;  Surgeon: Grace Isaac, MD;   Location: [facility];  Service: Thoracic;  Laterality: N/A;   VIDEO BRONCHOSCOPY WITH INSERTION OF INTERBRONCHIAL VALVE (IBV) N/A 04/12/2015   Procedure: VIDEO BRONCHOSCOPY WITH INSERTION OF 7MM INTERBRONCHIAL VALVES (IBV) IN SUPERIOR SEGMENT, MEDIAL AND POSTERIOR SEGMENT, AND LATERAL SEGMENT OF RIGHT LOWER LOBE;  Surgeon: Grace Isaac, MD;  Location: [facility] OR;  Service: Thoracic;  Laterality: N/A;  88mm Spiration Valve placed in Superior Segment, Medial and Posterior Segment, and Lateral Segment of Right Lower Lobe   VIDEO BRONCHOSCOPY WITH INSERTION OF INTERBRONCHIAL VALVE (IBV) Right 04/19/2015   Procedure: VIDEO BRONCHOSCOPY WITH INSERTION OF INTERBRONCHIAL VALVE (IBV);  Surgeon: Grace Isaac, MD;  Location: [facility];  Service: Thoracic;  Laterality: Right;   VIDEO BRONCHOSCOPY WITH INSERTION OF INTERBRONCHIAL VALVE (IBV) N/A 06/03/2015   Procedure: VIDEO BRONCHOSCOPY WITH REMOVAL OF INTERBRONCHIAL VALVE (IBV);  Surgeon: Grace Isaac, MD;  Location: [facility] OR;  Service: Thoracic;  Laterality: N/A;    Current Medications: Current Meds  Medication Sig   albuterol (VENTOLIN HFA) 108 (90 Base) MCG/ACT inhaler Inhale 2 puffs into the lungs every 4 (four) hours as needed for wheezing or shortness of breath.   Bempedoic Acid (NEXLETOL) 180 MG TABS Take 1 tablet by mouth daily.   benzonatate (TESSALON) 100 MG capsule Take  1 capsule (100 mg total) by mouth 3 (three) times daily as needed for cough.   clopidogrel (PLAVIX) 75 MG tablet Take 1 tablet by mouth once daily   dextromethorphan (DELSYM COUGH CHILDRENS) 30 MG/5ML liquid Take 5 mLs (30 mg total) by mouth 2 (two) times daily as needed for cough.   DUPIXENT 300 MG/2ML SOPN Inject 300 mg into the skin every 14 (fourteen) days.   fluocinonide cream (LIDEX) 6.29 % Apply 1 application topically 2 (two) times daily.   gabapentin (NEURONTIN) 100 MG capsule Take 100 mg by mouth 2 (two) times daily.    ibuprofen (ADVIL) 600 MG tablet Take 600 mg by mouth 3  (three) times daily as needed for pain or headache.   SYMBICORT 160-4.5 MCG/ACT inhaler Inhale 2 puffs into the lungs as needed. (Patient taking differently: Inhale 2 puffs into the lungs as needed (shortness of breath or wheezing).)   traMADol (ULTRAM) 50 MG tablet Take 50 mg by mouth every 12 (twelve) hours as needed for moderate pain.     Allergies:   Codeine, Rosuvastatin, Clindamycin/lincomycin, and Sympathomimetics   Social History   Socioeconomic History   Marital status: Married    Spouse name: Not on file   Number of children: Not on file   Years of education: Not on file   Highest education level: Not on file  Occupational History   Not on file  Tobacco Use   Smoking status: Former    Packs/day: 0.50    Years: 40.00    Total pack years: 20.00    Types: Cigarettes    Quit date: 03/25/2015    Years since quitting: 7.1   Smokeless tobacco: Never   Tobacco comments:    down to 4 aday  Vaping Use   Vaping Use: Never used  Substance and Sexual Activity   Alcohol use: No    Alcohol/week: 0.0 standard drinks of alcohol   Drug use: No   Sexual activity: Not on file  Other Topics Concern   Not on file  Social History Narrative   Not on file   Social Determinants of Health   Financial Resource Strain: Not on file  Food Insecurity: Not on file  Transportation Needs: Not on file  Physical Activity: Not on file  Stress: Not on file  Social Connections: Not on file     Family History: The patient's family history includes Cancer in her brother, father, and sister; Cerebrovascular Disease in her brother and brother; Diabetes in her mother; Heart disease in her brother and brother; Stroke in her sister and sister. ROS:   Please see the history of present illness.    All other systems reviewed and are negative.  EKGs/Labs/Other Studies Reviewed:    The following studies were reviewed today:   Recent Labs: 02/22/2022: ALT 15; BUN 24; Creatinine 0.5; Hemoglobin 12.7;  Platelets 309; Potassium 4.1; Sodium 141  Recent Lipid Panel    Component Value Date/Time   CHOL 166 05/26/2021 1045   TRIG 91 05/26/2021 1045   HDL 52 05/26/2021 1045   CHOLHDL 3.2 05/26/2021 1045   CHOLHDL 5.3 04/19/2009 1130   VLDL 23 04/19/2009 1130   LDLCALC 97 05/26/2021 1045    Physical Exam:    VS:  BP 118/70 (BP Location: Right Arm, Patient Position: Sitting, Cuff Size: Normal)   Pulse 72   Ht 5\' 1"  (1.549 m)   Wt 131 lb (59.4 kg)   SpO2 99%   BMI 24.75 kg/m  Wt Readings from Last 3 Encounters:  05/30/22 131 lb (59.4 kg)  02/22/22 133 lb 9.6 oz (60.6 kg)  11/20/21 132 lb 9.6 oz (60.1 kg)     GEN:  Well nourished, well developed in no acute distress HEENT: Normal NECK: No JVD; No carotid bruits LYMPHATICS: No lymphadenopathy CARDIAC: RRR, no murmurs, rubs, gallops RESPIRATORY:  Clear to auscultation without rales, wheezing or rhonchi  ABDOMEN: Soft, non-tender, non-distended MUSCULOSKELETAL:  No edema; No deformity  SKIN: Warm and dry NEUROLOGIC:  Alert and oriented x 3 PSYCHIATRIC:  Normal affect    Signed, Shirlee More, MD  05/30/2022 11:50 AM    [place]

## 2022-05-30 NOTE — Patient Instructions (Signed)
Medication Instructions:  Your physician recommends that you continue on your current medications as directed. Please refer to the Current Medication list given to you today. *If you need a refill on your cardiac medications before your next appointment, please call your pharmacy*   Lab Work: Your physician recommends that you return for lab work in: Today for [place]  If you have labs (blood work) drawn today and your tests are completely normal, you will receive your results only by: MyChart Message (if you have [place]) OR A paper copy in the mail If you have any lab test that is abnormal or we need to change your treatment, we will call you to review the results.   Testing/Procedures: NONE   Follow-Up: At [facility], you and your health needs are our priority.  As part of our continuing mission to provide you with exceptional heart care, we have created designated Provider Care Teams.  These Care Teams include your primary Cardiologist (physician) and Advanced Practice Providers (APPs -  Physician Assistants and Nurse Practitioners) who all work together to provide you with the care you need, when you need it.  We recommend signing up for the patient portal called "MyChart".  Sign up information is provided on this After Visit Summary.  MyChart is used to connect with patients for Virtual Visits (Telemedicine).  Patients are able to view lab/test results, encounter notes, upcoming appointments, etc.  Non-urgent messages can be sent to your provider as well.   To learn more about what you can do with MyChart, go to [email or web address].    Your next appointment:   1 year(s)  The format for your next appointment:   In Person  Provider:    Dr. Bettina Gavia, MD  Other Instructions   Important Information About [place]

## 2022-05-31 LAB — LIPID PANEL
Chol/HDL Ratio: 4 ratio (ref 0.0–4.4)
Cholesterol, Total: 165 mg/dL (ref 100–199)
HDL: 41 mg/dL (ref >39)
LDL Chol Calc (NIH): 101 mg/dL — ABNORMAL HIGH (ref 0–99)
Triglycerides: 126 mg/dL (ref 0–149)
VLDL Cholesterol Cal: 23 mg/dL (ref 5–40)

## 2022-05-31 LAB — LIPOPROTEIN A (LPA): Lipoprotein (a): 155.9 nmol/L — ABNORMAL HIGH (ref <75.0)

## 2022-08-19 ENCOUNTER — Other Ambulatory Visit: Payer: Self-pay | Admitting: Cardiology

## 2022-08-23 ENCOUNTER — Inpatient Hospital Stay: Payer: Medicare HMO | Attending: Oncology

## 2022-08-23 DIAGNOSIS — C778 Secondary and unspecified malignant neoplasm of lymph nodes of multiple regions: Secondary | ICD-10-CM | POA: Diagnosis not present

## 2022-08-23 DIAGNOSIS — C3411 Malignant neoplasm of upper lobe, right bronchus or lung: Secondary | ICD-10-CM | POA: Insufficient documentation

## 2022-08-23 DIAGNOSIS — Z452 Encounter for adjustment and management of vascular access device: Secondary | ICD-10-CM | POA: Diagnosis present

## 2022-08-23 LAB — BASIC METABOLIC PANEL
BUN: 22 — AB (ref 4–21)
CO2: 25 — AB (ref 13–22)
Chloride: 106 (ref 99–108)
Creatinine: 0.5 (ref 0.5–1.1)
Glucose: 95
Potassium: 4.1 mEq/L (ref 3.5–5.1)
Sodium: 140 (ref 137–147)

## 2022-08-23 LAB — CBC AND DIFFERENTIAL
HCT: 38 (ref 36–46)
Hemoglobin: 12.7 (ref 12.0–16.0)
Neutrophils Absolute: 3.48
Platelets: 308 10*3/uL (ref 150–400)
WBC: 5.2

## 2022-08-23 LAB — CBC: RBC: 4.45 (ref 3.87–5.11)

## 2022-08-23 LAB — COMPREHENSIVE METABOLIC PANEL
Albumin: 4.2 (ref 3.5–5.0)
Calcium: 9.1 (ref 8.7–10.7)

## 2022-08-23 LAB — HEPATIC FUNCTION PANEL
ALT: 9 U/L (ref 7–35)
AST: 28 (ref 13–35)
Alkaline Phosphatase: 47 (ref 25–125)
Bilirubin, Total: 0.8

## 2022-08-23 MED ORDER — HEPARIN SOD (PORK) LOCK FLUSH 100 UNIT/ML IV SOLN
500.0000 [IU] | Freq: Once | INTRAVENOUS | Status: AC | PRN
  Administered 2022-08-23: 500 [IU]

## 2022-08-23 MED ORDER — SODIUM CHLORIDE 0.9% FLUSH
10.0000 mL | INTRAVENOUS | Status: DC | PRN
  Administered 2022-08-23: 10 mL

## 2022-08-23 NOTE — Progress Notes (Signed)
Michelle King  [address] [place],  [place]  [address] [phone]  Clinic Day:  08/24/2022  Referring physician: Charlotte Sanes, MD  HISTORY OF PRESENT ILLNESS:  The patient is a 62 y.o. female with mediastinal and right supraclavicular nodal recurrence of her previous lung adenocarcinoma.  She did undergo chemoradiation before completing 1 year of maintenance durvalumab for her disease recurrence in March 2021.  She comes in today to review her chest CT done today as part of her continued radiographic lung cancer surveillance.  Since her last visit, the patient has been doing well.  Although she still coughs, she claims her coughing is nowhere as prominent as it was previously.  She denies having progressive shortness of breath, hemoptysis, or any other respiratory symptoms which concern her for possible disease recurrence.   PHYSICAL EXAM:  Blood pressure 103/67, pulse 77, temperature 98.7 F (37.1 C), resp. rate 16, height 5\' 1"  (1.549 m), weight 130 lb 1.6 oz (59 kg), SpO2 97 %. Wt Readings from Last 3 Encounters:  08/24/22 130 lb 1.6 oz (59 kg)  08/23/22 128 lb 11.2 oz (58.4 kg)  05/30/22 131 lb (59.4 kg)   Body mass index is 24.58 kg/m. Performance status (ECOG): 1 - Symptomatic but completely ambulatory Physical Exam Constitutional:      Appearance: Normal appearance. She is not ill-appearing.  HENT:     Mouth/Throat:     Mouth: Mucous membranes are moist.     Pharynx: Oropharynx is clear. No oropharyngeal exudate or posterior oropharyngeal erythema.  Cardiovascular:     Rate and Rhythm: Normal rate and regular rhythm.     Heart sounds: No murmur heard.    No friction rub. No gallop.  Pulmonary:     Effort: Pulmonary effort is normal. No respiratory distress.     Breath sounds: Normal breath sounds. No wheezing, rhonchi or rales.  Abdominal:     General: Bowel sounds are normal. There is no distension.     Palpations: Abdomen is soft.  There is no mass.     Tenderness: There is no abdominal tenderness.  Musculoskeletal:        General: No swelling.     Right lower leg: No edema.     Left lower leg: No edema.  Lymphadenopathy:     Cervical: No cervical adenopathy.     Upper Body:     Right upper body: No supraclavicular or axillary adenopathy.     Left upper body: No supraclavicular or axillary adenopathy.     Lower Body: No right inguinal adenopathy. No left inguinal adenopathy.  Skin:    General: Skin is warm.     Coloration: Skin is not jaundiced.     Findings: No lesion or rash.  Neurological:     General: No focal deficit present.     Mental Status: She is alert and oriented to person, place, and time. Mental status is at baseline.  Psychiatric:        Mood and Affect: Mood normal.        Behavior: Behavior normal.        Thought Content: Thought content normal.   SCANS: Her chest CT done yesterday revealed the following: FINDINGS: Cardiovascular: RIGHT-sided Port-A-Cath terminates at the caval to atrial junction entering via subclavian approach. Heart size is normal without pericardial effusion or sign of pericardial nodularity with three-vessel coronary artery calcifications. Calcified and noncalcified aortic atherosclerotic plaque in the thoracic aorta.  Central pulmonary vasculature is  unremarkable aside from distortion of the RIGHT hilum which is chronic and related to post treatment changes.  Mediastinum/Nodes: No discrete adenopathy in the chest. Esophagus grossly normal. Soft tissue along the RIGHT paratracheal chain contiguous with paramediastinal fibrotic changes and perihilar consolidative and soft tissue changes unchanged since June of 2022.  Lungs/Pleura: Consolidation and soft tissue tracking from the RIGHT lung apex along the RIGHT mediastinal border following RIGHT upper lobectomy partial lung resection. Area measuring approximately 4.9 x 3.7 cm. Blurring of fat planes along the  pleural margins in the upper lobe and along the mediastinal border, all similar to June of 2022. No sign of pleural effusion or pleural thickening elsewhere in the chest. No new suspicious pulmonary nodule with signs of mild pulmonary emphysema in the LEFT upper lobe stable juxtapleural nodule in the LEFT upper lobe on image 18/4 measuring 3 mm.  Upper Abdomen: Signs of hepatic steatosis. Mild fissural widening of hepatic fissures. No suspicious lesion or signs of acute process related to liver biliary tree on limited evaluation. Imaged portions of pancreas, spleen, adrenal glands and kidneys without acute findings.  No acute upper abdominal process related to gastrointestinal tract to the extent evaluated.  Musculoskeletal: No chest wall mass. Irregularity of upper ribs in the RIGHT chest involving ribs 1 through 4 with subtle sclerosis likely related to post treatment changes and without change. Osteopenia.  IMPRESSION: 1. Stable post treatment changes in the RIGHT chest without evidence of recurrent or metastatic disease. 2. Signs of hepatic steatosis. Mild fissural widening of hepatic fissures. Correlate with any clinical or laboratory evidence of liver disease. 3. Aortic atherosclerosis and three-vessel coronary artery calcifications. 4. Pulmonary emphysema as before.  Aortic Atherosclerosis (ICD10-I70.0) and Emphysema (ICD10-J43.9).  LABS:      Latest Ref Rng & Units 08/23/2022   12:00 AM 02/22/2022   12:00 AM 08/24/2021   12:00 AM  CBC  WBC  5.2     5.3     5.0   Hemoglobin 12.0 - 16.0 12.7     12.7     13.1   Hematocrit 36 - 46 38     37     40   Platelets 150 - 400 K/uL 308     309     282      This result is from an external source.      Latest Ref Rng & Units 08/23/2022   12:00 AM 02/22/2022   12:00 AM 08/25/2021   12:00 AM  CMP  BUN 4 - 21 22     24     17    Creatinine 0.5 - 1.1 0.5     0.5     0.5   Sodium 137 - 147 140     141     141   Potassium 3.5 -  5.1 mEq/L 4.1     4.1     3.8   Chloride 99 - 108 106     107     108   CO2 13 - 22 25     28     27    Calcium 8.7 - 10.7 9.1     9.0     9.1   Alkaline Phos 25 - 125 47     49     65   AST 13 - 35 28     28     24    ALT 7 - 35 U/L 9     15  15      This result is from an external source.   ASSESSMENT & PLAN:  Assessment/Plan:  A 62 y.o. female with mediastinal and right supraclavicular nodal recurrence of her lung adenocarcinoma, who completed all of her definitive therapy in March 2021.  In clinic today, I went over her chest CT images with her, for which she could see she continues to show no evidence of disease recurrence.  Clinically, the patient is doing well. I will see this patient back in 6 months for repeat clinical assessment.  A chest x-ray will be done on the day of her next visit for her continued radiographic lung cancer surveillance.  The patient understands all the plans discussed today and is in agreement with them.    Robertine Kipper Macarthur Critchley, MD

## 2022-08-24 ENCOUNTER — Encounter: Payer: Self-pay | Admitting: Hematology and Oncology

## 2022-08-24 ENCOUNTER — Other Ambulatory Visit: Payer: Self-pay | Admitting: Oncology

## 2022-08-24 ENCOUNTER — Inpatient Hospital Stay (INDEPENDENT_AMBULATORY_CARE_PROVIDER_SITE_OTHER): Payer: Medicare HMO | Admitting: Oncology

## 2022-08-24 VITALS — BP 103/67 | HR 77 | Temp 98.7°F | Resp 16 | Ht 61.0 in | Wt 130.1 lb

## 2022-08-24 DIAGNOSIS — C3411 Malignant neoplasm of upper lobe, right bronchus or lung: Secondary | ICD-10-CM

## 2022-08-28 ENCOUNTER — Other Ambulatory Visit: Payer: Self-pay | Admitting: Pulmonary Disease

## 2022-08-28 ENCOUNTER — Other Ambulatory Visit: Payer: Self-pay | Admitting: Oncology

## 2022-08-29 ENCOUNTER — Encounter: Payer: Self-pay | Admitting: Oncology

## 2022-09-23 ENCOUNTER — Other Ambulatory Visit: Payer: Self-pay | Admitting: Cardiology

## 2022-09-30 DIAGNOSIS — L900 Lichen sclerosus et atrophicus: Secondary | ICD-10-CM

## 2022-09-30 HISTORY — DX: Lichen sclerosus et atrophicus: L90.0

## 2022-11-08 ENCOUNTER — Encounter: Payer: Self-pay | Admitting: Hematology and Oncology

## 2022-11-08 NOTE — Telephone Encounter (Signed)
Done

## 2022-12-18 ENCOUNTER — Ambulatory Visit (HOSPITAL_BASED_OUTPATIENT_CLINIC_OR_DEPARTMENT_OTHER): Payer: Medicare HMO | Admitting: Pulmonary Disease

## 2023-02-04 ENCOUNTER — Telehealth: Payer: Self-pay | Admitting: Oncology

## 2023-02-04 NOTE — Telephone Encounter (Signed)
02/04/23 Spoke with patients husband and chest xray order sent to outpatient center at [facility].

## 2023-02-22 ENCOUNTER — Ambulatory Visit: Payer: Medicare HMO | Admitting: Oncology

## 2023-02-22 ENCOUNTER — Other Ambulatory Visit: Payer: Medicare HMO

## 2023-02-26 NOTE — Progress Notes (Unsigned)
[facility]  [address] [place],  [place]  [address] [phone]  Clinic Day:  02/27/2023  Referring physician: Shelle Iron, MD  HISTORY OF PRESENT ILLNESS:  The patient is a 62 y.o. female with mediastinal and right supraclavicular nodal recurrence of her previous lung adenocarcinoma.  She did undergo chemoradiation with carboplatin/paclitaxel before completing 1 year of maintenance durvalumab for her disease recurrence in March 2021.  She comes in today to review her chest x-ray done today as part of her continued radiographic lung cancer surveillance.  Since her last visit, the patient has been doing well.  Although she still coughs, she claims her coughing is nowhere as prominent as it was previously.  She denies having progressive shortness of breath, hemoptysis, or any other respiratory symptoms which concern her for possible disease recurrence.   PHYSICAL EXAM:  Blood pressure 111/70, pulse 73, temperature 98.4 F (36.9 C), resp. rate 16, height 5\' 1"  (1.549 m), weight 126 lb 3.2 oz (57.2 kg), SpO2 100%. Wt Readings from Last 3 Encounters:  02/27/23 126 lb 3.2 oz (57.2 kg)  08/24/22 130 lb 1.6 oz (59 kg)  08/23/22 128 lb 11.2 oz (58.4 kg)   Body mass index is 23.85 kg/m. Performance status (ECOG): 1 - Symptomatic but completely ambulatory Physical Exam Constitutional:      Appearance: Normal appearance. She is not ill-appearing.  HENT:     Mouth/Throat:     Mouth: Mucous membranes are moist.     Pharynx: Oropharynx is clear. No oropharyngeal exudate or posterior oropharyngeal erythema.  Cardiovascular:     Rate and Rhythm: Normal rate and regular rhythm.     Heart sounds: No murmur heard.    No friction rub. No gallop.  Pulmonary:     Effort: Pulmonary effort is normal. No respiratory distress.     Breath sounds: Normal breath sounds. No wheezing, rhonchi or rales.  Abdominal:     General: Bowel sounds are normal. There is no  distension.     Palpations: Abdomen is soft. There is no mass.     Tenderness: There is no abdominal tenderness.  Musculoskeletal:        General: No swelling.     Right lower leg: No edema.     Left lower leg: No edema.  Lymphadenopathy:     Cervical: No cervical adenopathy.     Upper Body:     Right upper body: No supraclavicular or axillary adenopathy.     Left upper body: No supraclavicular or axillary adenopathy.     Lower Body: No right inguinal adenopathy. No left inguinal adenopathy.  Skin:    General: Skin is warm.     Coloration: Skin is not jaundiced.     Findings: No lesion or rash.  Neurological:     General: No focal deficit present.     Mental Status: She is alert and oriented to person, place, and time. Mental status is at baseline.  Psychiatric:        Mood and Affect: Mood normal.        Behavior: Behavior normal.        Thought Content: Thought content normal.   SCANS: Her chest x-ray done today revealed the following: FINDINGS: The heart size and mediastinal contours are stable. Right central venous line is unchanged. Chronic change of the right upper thorax is unchanged. The left lung is clear. The visualized skeletal structures are stable.  IMPRESSION: Chronic change of the right upper thorax  is unchanged.  LABS:      Latest Ref Rng & Units 08/23/2022   12:00 AM 02/22/2022   12:00 AM 08/24/2021   12:00 AM  CBC  WBC  5.2     5.3     5.0   Hemoglobin 12.0 - 16.0 12.7     12.7     13.1   Hematocrit 36 - 46 38     37     40   Platelets 150 - 400 K/uL 308     309     282      This result is from an external source.      Latest Ref Rng & Units 08/23/2022   12:00 AM 02/22/2022   12:00 AM 08/25/2021   12:00 AM  CMP  BUN 4 - 21 22     24     17    Creatinine 0.5 - 1.1 0.5     0.5     0.5   Sodium 137 - 147 140     141     141   Potassium 3.5 - 5.1 mEq/L 4.1     4.1     3.8   Chloride 99 - 108 106     107     108   CO2 13 - 22 25     28     27    Calcium  8.7 - 10.7 9.1     9.0     9.1   Alkaline Phos 25 - 125 47     49     65   AST 13 - 35 28     28     24    ALT 7 - 35 U/L 9     15     15       This result is from an external source.   ASSESSMENT & PLAN:  Assessment/Plan:  A 62 y.o. female with mediastinal and right supraclavicular nodal recurrence of her lung adenocarcinoma, who completed all of her definitive therapy in March 2021.  In clinic today, I went over her chest x-ray images with her, for which she could see she continues to show no evidence of disease recurrence.  Clinically, the patient is doing well. I will see this patient back in 6 months for repeat clinical assessment.  A chest CT will be done a day before her next visit for her continued radiographic lung cancer surveillance.  The patient understands all the plans discussed today and is in agreement with them.     Kirby Funk, MD

## 2023-02-27 ENCOUNTER — Other Ambulatory Visit: Payer: Self-pay | Admitting: Oncology

## 2023-02-27 ENCOUNTER — Inpatient Hospital Stay: Payer: Medicare HMO | Attending: Oncology

## 2023-02-27 ENCOUNTER — Inpatient Hospital Stay (INDEPENDENT_AMBULATORY_CARE_PROVIDER_SITE_OTHER): Payer: Medicare HMO | Admitting: Oncology

## 2023-02-27 VITALS — BP 111/70 | HR 73 | Temp 98.4°F | Resp 16 | Ht 61.0 in | Wt 126.2 lb

## 2023-02-27 DIAGNOSIS — Z9221 Personal history of antineoplastic chemotherapy: Secondary | ICD-10-CM | POA: Diagnosis not present

## 2023-02-27 DIAGNOSIS — Z85118 Personal history of other malignant neoplasm of bronchus and lung: Secondary | ICD-10-CM | POA: Diagnosis present

## 2023-02-27 DIAGNOSIS — C3411 Malignant neoplasm of upper lobe, right bronchus or lung: Secondary | ICD-10-CM

## 2023-02-27 DIAGNOSIS — C3401 Malignant neoplasm of right main bronchus: Secondary | ICD-10-CM

## 2023-02-27 DIAGNOSIS — Z923 Personal history of irradiation: Secondary | ICD-10-CM | POA: Insufficient documentation

## 2023-02-27 LAB — CBC WITH DIFFERENTIAL (CANCER CENTER ONLY)
Abs Immature Granulocytes: 0.02 10*3/uL (ref 0.00–0.07)
Basophils Absolute: 0 10*3/uL (ref 0.0–0.1)
Basophils Relative: 1 %
Eosinophils Absolute: 0.1 10*3/uL (ref 0.0–0.5)
Eosinophils Relative: 3 %
HCT: 34.3 % — ABNORMAL LOW (ref 36.0–46.0)
Hemoglobin: 10.7 g/dL — ABNORMAL LOW (ref 12.0–15.0)
Immature Granulocytes: 0 %
Lymphocytes Relative: 16 %
Lymphs Abs: 0.8 10*3/uL (ref 0.7–4.0)
MCH: 27.7 pg (ref 26.0–34.0)
MCHC: 31.2 g/dL (ref 30.0–36.0)
MCV: 88.9 fL (ref 80.0–100.0)
Monocytes Absolute: 0.5 10*3/uL (ref 0.1–1.0)
Monocytes Relative: 10 %
Neutro Abs: 3.3 10*3/uL (ref 1.7–7.7)
Neutrophils Relative %: 70 %
Platelet Count: 312 10*3/uL (ref 150–400)
RBC: 3.86 MIL/uL — ABNORMAL LOW (ref 3.87–5.11)
RDW: 13.2 % (ref 11.5–15.5)
WBC Count: 4.8 10*3/uL (ref 4.0–10.5)
nRBC: 0 % (ref 0.0–0.2)

## 2023-02-27 LAB — CMP (CANCER CENTER ONLY)
ALT: 10 U/L (ref 0–44)
AST: 16 U/L (ref 15–41)
Albumin: 4.2 g/dL (ref 3.5–5.0)
Alkaline Phosphatase: 41 U/L (ref 38–126)
Anion gap: 7 (ref 5–15)
BUN: 19 mg/dL (ref 8–23)
CO2: 27 mmol/L (ref 22–32)
Calcium: 9 mg/dL (ref 8.9–10.3)
Chloride: 104 mmol/L (ref 98–111)
Creatinine: 0.57 mg/dL (ref 0.44–1.00)
GFR, Estimated: >60 mL/min (ref >60)
Glucose, Bld: 87 mg/dL (ref 70–99)
Potassium: 4 mmol/L (ref 3.5–5.1)
Sodium: 138 mmol/L (ref 135–145)
Total Bilirubin: 0.6 mg/dL (ref 0.3–1.2)
Total Protein: 6.8 g/dL (ref 6.5–8.1)

## 2023-02-27 MED ORDER — HEPARIN SOD (PORK) LOCK FLUSH 100 UNIT/ML IV SOLN
500.0000 [IU] | Freq: Once | INTRAVENOUS | Status: AC | PRN
  Administered 2023-02-27: 500 [IU]

## 2023-02-27 MED ORDER — SODIUM CHLORIDE 0.9% FLUSH
10.0000 mL | INTRAVENOUS | Status: DC | PRN
  Administered 2023-02-27: 10 mL

## 2023-03-08 ENCOUNTER — Encounter: Payer: Self-pay | Admitting: Oncology

## 2023-04-29 ENCOUNTER — Other Ambulatory Visit: Payer: Self-pay | Admitting: Cardiology

## 2023-05-31 ENCOUNTER — Telehealth: Payer: Self-pay | Admitting: Cardiology

## 2023-05-31 ENCOUNTER — Other Ambulatory Visit: Payer: Self-pay

## 2023-05-31 MED ORDER — CLOPIDOGREL BISULFATE 75 MG PO TABS: 75.0000 mg | ORAL_TABLET | Freq: Every day | ORAL | 0 refills | Status: DC

## 2023-05-31 MED ORDER — NEXLETOL 180 MG PO TABS: 1.0000 | ORAL_TABLET | Freq: Every day | ORAL | 0 refills | Status: DC

## 2023-05-31 NOTE — Telephone Encounter (Signed)
*  STAT* If patient is at the pharmacy, call can be transferred to refill team.   1. Which medications need to be refilled? (please list name of each medication and dose if known)   clopidogrel (PLAVIX) 75 MG tablet  Bempedoic Acid (NEXLETOL) 180 MG TABS   2. Would you like to learn more about the convenience, safety, & potential cost savings by using the [facility]?   3. Are you open to using the [facility] (Type [facility]. ).  4. Which pharmacy/location (including street and city if local pharmacy) is medication to be sent to?  [id number] - [place], [place] - [address]   5. Do they need a 30 day or 90 day supply?   90 day  Patient stated she is almost out of these medications.  Patient has appointment scheduled on 1/13.

## 2023-05-31 NOTE — Telephone Encounter (Signed)
Prescriptions filled to next appointment.

## 2023-05-31 NOTE — Progress Notes (Signed)
Prescriptions filled to next appointment.

## 2023-07-25 NOTE — Progress Notes (Signed)
 Cardiology Office Note:    Date:  07/29/2023   ID:  Michelle King, DOB [birth year], MRN [id number]  PCP:  Marelyn Axon, MD  Cardiologist:  Redell Leiter, MD    Referring MD: Marelyn Axon, MD    ASSESSMENT:    1. Coronary artery disease involving native coronary artery of native heart with angina pectoris (HCC)   2. Mixed hyperlipidemia   3. Statin intolerance   4. Chronic obstructive pulmonary disease, unspecified COPD type (HCC)   5. Malignant neoplasm of upper lobe of right lung (HCC)    PLAN:    In order of problems listed above:  Stable course following her initial presentation with anterior ST elevation MI and initial severe LV dysfunction that normal continue medical therapy including clopidogrel  and lipid-lowering with DOAC acid statin intolerant recheck echocardiogram last assessment 2017 previously had severe LV dysfunction on normal last Continue current lipid-lowering therapy today we will check a lipid profile Stable COPD she has been improved with and will continue her current bronchodilators encouraged him to get a home air purifier which may help with her chronic cough Stable lung cancer continues to follow with oncology    Next appointment: 1 year   Medication Adjustments/Labs and Tests Ordered: Current medicines are reviewed at length with the patient today.  Concerns regarding medicines are outlined above.  Orders Placed This Encounter  Procedures   EKG 12-Lead   No orders of the defined types were placed in this encounter.    History of Present Illness:    Michelle King is a 63 y.o. female with a hx of CAD with ST elevation MI PCI and stent proximal LAD in 2010 initial severe cardiomyopathy with subsequent recovery ejection fraction on guideline directed therapy COPD lung cancer mediastinal and supraclavicular recurrence dyslipidemia with elevated LP(a) statin intolerance on bempedoic acid  and chronic urticaria last  seen 05/30/2022.  She can continue to follow with oncology for lung cancer most recently seen 02/27/2023.  Last cardiac diagnostic testing with left heart cath 06/11/2016 with mild nonobstructive CAD patent stent normal left ventricular systolic function.  An echocardiogram done in 2016 reported could not be seen in Care Everywhere.  Recent labs 10 29,024 hemoglobin 11.7 normocytic platelets 325,000 CMP sodium 139 potassium 3.8 GFR greater than 90 cc and normal liver test  Compliance with diet, lifestyle and medications: Yes  She tolerates bempedoic acid  without muscle symptoms and clopidogrel  without GI upset. Overall is done better she has stable exertional shortness of breath intermittent wheezing related to her bronchodilators. She is not having edema orthopnea chest pain palpitation or syncope  Past Medical History:  Diagnosis Date   Angio-edema 09/05/2016   Arthritis    spine   CAD (coronary artery disease)    Cancer (HCC)    lung   Chronic cholecystitis 04/22/2019   Chronic idiopathic urticaria 09/05/2016   Complication of anesthesia    COPD (chronic obstructive pulmonary disease) (HCC)    Coronary artery disease involving native coronary artery of native heart with angina pectoris (HCC)    Overview:  Anterior MI 2010 with PCI and stent Her last stress test pharmacologic showed normal ejection fraction and no evidence of ischemia. She has a history of previous LAD infarction with severely depressed ejection fraction that normalized with medical therapy and revascularization percutaneously.   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   fluticasone  furoate-vilanterol (BREO ELLIPTA ) 100-25 MCG/ACT AEPB Inhale 1 puff into the lungs as needed.   loratadine  (CLARITIN ) 10 MG tablet Take 10 mg by mouth daily.   triamcinolone cream (KENALOG) 0.1 % Apply 1 Application topically daily.      EKGs/Labs/Other Studies Reviewed:    The following studies were reviewed today:      EKG Interpretation Date/Time:  Monday July 29 2023 13:17:59 EST Ventricular Rate:  76 PR Interval:  156 QRS Duration:  64 QT Interval:  368 QTC Calculation: 414 R Axis:   -16  Text Interpretation: Normal sinus rhythm Low voltage QRS ABNORMAL R WAVE PROGRESSION V2 to V3 When compared with ECG of 19-Apr-2015 07:00, No significant change was found Confirmed by Monetta Rogue ([id number]) on 07/29/2023 1:24:16 PM  EKG Interpretation Date/Time:  Monday July 29 2023 13:17:59 EST Ventricular Rate:  76 PR Interval:  156 QRS Duration:  64 QT Interval:  368 QTC Calculation: 414 R Axis:   -16  Text Interpretation: Normal sinus rhythm Low voltage QRS ABNORMAL R WAVE PROGRESSION V2 to V3 When compared with ECG of 19-Apr-2015 07:00, No significant change was found Confirmed by Monetta Rogue ([id number]) on 07/29/2023 1:24:16 PM   Recent Labs: 02/27/2023: ALT 10; BUN 19; Creatinine 0.57; Hemoglobin 10.7; Platelet Count 312; Potassium 4.0; Sodium 138  Recent Lipid Panel    Component Value Date/Time   CHOL 165 05/30/2022 1051   TRIG 126  05/30/2022 1051   HDL 41 05/30/2022 1051   CHOLHDL 4.0 05/30/2022 1051   CHOLHDL 5.3 04/19/2009 1130   VLDL 23 04/19/2009 1130   LDLCALC 101 (H) 05/30/2022 1051    Physical Exam:    VS:  BP 118/72   Pulse 76   Ht 5' 1 (1.549 m)   Wt 123 lb 3.2 oz (55.9 kg)   SpO2 97%   BMI 23.28 kg/m     Wt Readings from Last 3 Encounters:  07/29/23 123 lb 3.2 oz (55.9 kg)  02/27/23 126 lb 3.2 oz (57.2 kg)  08/24/22 130 lb 1.6 oz (59 kg)     GEN: She has an appearance that is markedly improved from her baseline frailty and chronic illness previously well nourished, well developed in no acute distress HEENT: Normal NECK: No JVD; No carotid bruits LYMPHATICS: No lymphadenopathy CARDIAC: RRR, no murmurs, rubs, gallops RESPIRATORY:  Clear to auscultation without rales, wheezing or rhonchi  ABDOMEN: Soft, non-tender, non-distended MUSCULOSKELETAL:  No edema; No deformity  SKIN: Warm and dry NEUROLOGIC:  Alert and oriented x 3 PSYCHIATRIC:  Normal affect    Signed, Rogue Monetta, MD  07/29/2023 1:28 PM    [facility]

## 2023-07-29 ENCOUNTER — Encounter: Payer: Self-pay | Admitting: Hematology and Oncology

## 2023-07-29 ENCOUNTER — Ambulatory Visit: Payer: Medicare HMO | Attending: Cardiology | Admitting: Cardiology

## 2023-07-29 ENCOUNTER — Other Ambulatory Visit: Payer: Self-pay

## 2023-07-29 ENCOUNTER — Encounter: Payer: Self-pay | Admitting: Cardiology

## 2023-07-29 VITALS — BP 118/72 | HR 76 | Ht 61.0 in | Wt 123.2 lb

## 2023-07-29 DIAGNOSIS — C3411 Malignant neoplasm of upper lobe, right bronchus or lung: Secondary | ICD-10-CM

## 2023-07-29 DIAGNOSIS — J449 Chronic obstructive pulmonary disease, unspecified: Secondary | ICD-10-CM | POA: Diagnosis not present

## 2023-07-29 DIAGNOSIS — Z789 Other specified health status: Secondary | ICD-10-CM | POA: Diagnosis not present

## 2023-07-29 DIAGNOSIS — E782 Mixed hyperlipidemia: Secondary | ICD-10-CM

## 2023-07-29 DIAGNOSIS — I25119 Atherosclerotic heart disease of native coronary artery with unspecified angina pectoris: Secondary | ICD-10-CM | POA: Diagnosis not present

## 2023-07-29 NOTE — Patient Instructions (Signed)
 Medication Instructions:  Your physician recommends that you continue on your current medications as directed. Please refer to the Current Medication list given to you today.  *If you need a refill on your cardiac medications before your next appointment, please call your pharmacy*   Lab Work: Your physician recommends that you return for lab work in:   Labs today: Lipids  If you have labs (blood work) drawn today and your tests are completely normal, you will receive your results only by: MyChart Message (if you have MyChart) OR A paper copy in the mail If you have any lab test that is abnormal or we need to change your treatment, we will call you to review the results.   Testing/Procedures: Your physician has requested that you have an echocardiogram. Echocardiography is a painless test that uses sound waves to create images of your heart. It provides your doctor with information about the size and shape of your heart and how well your heart's chambers and valves are working. This procedure takes approximately one hour. There are no restrictions for this procedure. Please do NOT wear cologne, perfume, aftershave, or lotions (deodorant is allowed). Please arrive 15 minutes prior to your appointment time.  Please note: We ask at that you not bring children with you during ultrasound (echo/ vascular) testing. Due to room size and safety concerns, children are not allowed in the ultrasound rooms during exams. Our front office staff cannot provide observation of children in our lobby area while testing is being conducted. An adult accompanying a patient to their appointment will only be allowed in the ultrasound room at the discretion of the ultrasound technician under special circumstances. We apologize for any inconvenience.    Follow-Up: At [facility], you and your health needs are our priority.  As part of our continuing mission to provide you with exceptional heart care, we  have created designated Provider Care Teams.  These Care Teams include your primary Cardiologist (physician) and Advanced Practice Providers (APPs -  Physician Assistants and Nurse Practitioners) who all work together to provide you with the care you need, when you need it.  We recommend signing up for the patient portal called MyChart.  Sign up information is provided on this After Visit Summary.  MyChart is used to connect with patients for Virtual Visits (Telemedicine).  Patients are able to view lab/test results, encounter notes, upcoming appointments, etc.  Non-urgent messages can be sent to your provider as well.   To learn more about what you can do with MyChart, go to [email or web address].    Your next appointment:   1 year(s)  Provider:   Redell Leiter, MD    Other Instructions None

## 2023-07-30 LAB — LIPID PANEL
Chol/HDL Ratio: 3.7 {ratio} (ref 0.0–4.4)
Cholesterol, Total: 174 mg/dL (ref 100–199)
HDL: 47 mg/dL (ref >39)
LDL Chol Calc (NIH): 106 mg/dL — ABNORMAL HIGH (ref 0–99)
Triglycerides: 117 mg/dL (ref 0–149)
VLDL Cholesterol Cal: 21 mg/dL (ref 5–40)

## 2023-07-31 NOTE — Progress Notes (Signed)
 Attempted to call the patient. Call could not be completed as dialed.

## 2023-08-15 ENCOUNTER — Ambulatory Visit (HOSPITAL_BASED_OUTPATIENT_CLINIC_OR_DEPARTMENT_OTHER)
Admission: RE | Admit: 2023-08-15 | Discharge: 2023-08-15 | Disposition: A | Discharge status: Home / Self Care | Payer: Medicare HMO | Source: Ambulatory Visit | Attending: Oncology | Admitting: Oncology

## 2023-08-15 ENCOUNTER — Inpatient Hospital Stay: Payer: Medicare HMO | Attending: Oncology

## 2023-08-15 ENCOUNTER — Other Ambulatory Visit: Payer: Medicare HMO

## 2023-08-15 ENCOUNTER — Other Ambulatory Visit: Payer: Self-pay

## 2023-08-15 DIAGNOSIS — Z08 Encounter for follow-up examination after completed treatment for malignant neoplasm: Secondary | ICD-10-CM

## 2023-08-15 DIAGNOSIS — C3411 Malignant neoplasm of upper lobe, right bronchus or lung: Secondary | ICD-10-CM | POA: Insufficient documentation

## 2023-08-15 LAB — CBC WITH DIFFERENTIAL (CANCER CENTER ONLY)
Abs Immature Granulocytes: 0.02 10*3/uL (ref 0.00–0.07)
Basophils Absolute: 0 10*3/uL (ref 0.0–0.1)
Basophils Relative: 1 %
Eosinophils Absolute: 0.1 10*3/uL (ref 0.0–0.5)
Eosinophils Relative: 3 %
HCT: 33.3 % — ABNORMAL LOW (ref 36.0–46.0)
Hemoglobin: 10.8 g/dL — ABNORMAL LOW (ref 12.0–15.0)
Immature Granulocytes: 0 %
Lymphocytes Relative: 17 %
Lymphs Abs: 0.9 10*3/uL (ref 0.7–4.0)
MCH: 27.1 pg (ref 26.0–34.0)
MCHC: 32.4 g/dL (ref 30.0–36.0)
MCV: 83.7 fL (ref 80.0–100.0)
Monocytes Absolute: 0.5 10*3/uL (ref 0.1–1.0)
Monocytes Relative: 10 %
Neutro Abs: 3.4 10*3/uL (ref 1.7–7.7)
Neutrophils Relative %: 69 %
Platelet Count: 323 10*3/uL (ref 150–400)
RBC: 3.98 MIL/uL (ref 3.87–5.11)
RDW: 14.4 % (ref 11.5–15.5)
WBC Count: 5 10*3/uL (ref 4.0–10.5)
nRBC: 0 % (ref 0.0–0.2)
nRBC: 0 /100{WBCs}

## 2023-08-15 LAB — CMP (CANCER CENTER ONLY)
ALT: <5 U/L (ref 0–44)
AST: 16 U/L (ref 15–41)
Albumin: 4.3 g/dL (ref 3.5–5.0)
Alkaline Phosphatase: 50 U/L (ref 38–126)
Anion gap: 12 (ref 5–15)
BUN: 16 mg/dL (ref 8–23)
CO2: 25 mmol/L (ref 22–32)
Calcium: 9.1 mg/dL (ref 8.9–10.3)
Chloride: 105 mmol/L (ref 98–111)
Creatinine: 0.54 mg/dL (ref 0.44–1.00)
GFR, Estimated: >60 mL/min (ref >60)
Glucose, Bld: 87 mg/dL (ref 70–99)
Potassium: 3.9 mmol/L (ref 3.5–5.1)
Sodium: 141 mmol/L (ref 135–145)
Total Bilirubin: 0.4 mg/dL (ref 0.0–1.2)
Total Protein: 6.4 g/dL — ABNORMAL LOW (ref 6.5–8.1)

## 2023-08-15 MED ORDER — HEPARIN SOD (PORK) LOCK FLUSH 10 UNIT/ML IV SOLN: 10.0000 [IU] | Freq: Once | INTRAVENOUS | Status: DC

## 2023-08-15 MED ORDER — HEPARIN SOD (PORK) LOCK FLUSH 100 UNIT/ML IV SOLN
500.0000 [IU] | Freq: Once | INTRAVENOUS | Status: AC
  Administered 2023-08-15: 500 [IU] via INTRAVENOUS

## 2023-08-15 MED ORDER — IOHEXOL 300 MG/ML  SOLN
100.0000 mL | Freq: Once | INTRAMUSCULAR | Status: AC | PRN
  Administered 2023-08-15: 100 mL via INTRAVENOUS

## 2023-08-16 ENCOUNTER — Ambulatory Visit: Payer: Medicare HMO | Attending: Cardiology

## 2023-08-16 DIAGNOSIS — I25119 Atherosclerotic heart disease of native coronary artery with unspecified angina pectoris: Secondary | ICD-10-CM | POA: Diagnosis not present

## 2023-08-16 DIAGNOSIS — C3411 Malignant neoplasm of upper lobe, right bronchus or lung: Secondary | ICD-10-CM

## 2023-08-16 DIAGNOSIS — E782 Mixed hyperlipidemia: Secondary | ICD-10-CM | POA: Diagnosis not present

## 2023-08-16 DIAGNOSIS — Z789 Other specified health status: Secondary | ICD-10-CM

## 2023-08-16 DIAGNOSIS — J449 Chronic obstructive pulmonary disease, unspecified: Secondary | ICD-10-CM

## 2023-08-16 LAB — ECHOCARDIOGRAM COMPLETE
Area-P 1/2: 4.26 cm2
MV M vel: 2.64 m/s
MV Peak grad: 27.9 mm[Hg]
S' Lateral: 2.8 cm

## 2023-08-18 NOTE — Progress Notes (Unsigned)
[facility]  [address] [place],  [place]  [address] [phone]  Clinic Day:  02/27/2023  Referring physician: Shelle Iron, MD  HISTORY OF PRESENT ILLNESS:  The patient is a 63 y.o. female with mediastinal and right supraclavicular nodal recurrence of her previous lung adenocarcinoma.  She did undergo chemoradiation with carboplatin/paclitaxel before completing 1 year of maintenance durvalumab for her disease recurrence in March 2021.  She comes in today to review her chest CT that was done as part of her continued radiographic lung cancer surveillance.  Since her last visit, the patient has been doing well.  Although she still coughs, she claims her coughing is nowhere as prominent as it was previously.  She denies having progressive shortness of breath, hemoptysis, or any other respiratory symptoms which concern her for possible disease recurrence.   PHYSICAL EXAM:  There were no vitals taken for this visit. Wt Readings from Last 3 Encounters:  07/29/23 123 lb 3.2 oz (55.9 kg)  02/27/23 126 lb 3.2 oz (57.2 kg)  08/24/22 130 lb 1.6 oz (59 kg)   There is no height or weight on file to calculate BMI. Performance status (ECOG): 1 - Symptomatic but completely ambulatory Physical Exam Constitutional:      Appearance: Normal appearance. She is not ill-appearing.  HENT:     Mouth/Throat:     Mouth: Mucous membranes are moist.     Pharynx: Oropharynx is clear. No oropharyngeal exudate or posterior oropharyngeal erythema.  Cardiovascular:     Rate and Rhythm: Normal rate and regular rhythm.     Heart sounds: No murmur heard.    No friction rub. No gallop.  Pulmonary:     Effort: Pulmonary effort is normal. No respiratory distress.     Breath sounds: Normal breath sounds. No wheezing, rhonchi or rales.  Abdominal:     General: Bowel sounds are normal. There is no distension.     Palpations: Abdomen is soft. There is no mass.     Tenderness: There  is no abdominal tenderness.  Musculoskeletal:        General: No swelling.     Right lower leg: No edema.     Left lower leg: No edema.  Lymphadenopathy:     Cervical: No cervical adenopathy.     Upper Body:     Right upper body: No supraclavicular or axillary adenopathy.     Left upper body: No supraclavicular or axillary adenopathy.     Lower Body: No right inguinal adenopathy. No left inguinal adenopathy.  Skin:    General: Skin is warm.     Coloration: Skin is not jaundiced.     Findings: No lesion or rash.  Neurological:     General: No focal deficit present.     Mental Status: She is alert and oriented to person, place, and time. Mental status is at baseline.  Psychiatric:        Mood and Affect: Mood normal.        Behavior: Behavior normal.        Thought Content: Thought content normal.   SCANS: Her chest x-ray done today revealed the following: FINDINGS: The heart size and mediastinal contours are stable. Right central venous line is unchanged. Chronic change of the right upper thorax is unchanged. The left lung is clear. The visualized skeletal structures are stable.  IMPRESSION: Chronic change of the right upper thorax is unchanged.  LABS:      Latest Ref Rng &  Units 08/15/2023   10:45 AM 02/27/2023   10:28 AM 08/23/2022   12:00 AM  CBC  WBC 4.0 - 10.5 K/uL 5.0  4.8  5.2      Hemoglobin 12.0 - 15.0 g/dL 40.9  81.1  91.4      Hematocrit 36.0 - 46.0 % 33.3  34.3  38      Platelets 150 - 400 K/uL 323  312  308         This result is from an external source.      Latest Ref Rng & Units 08/15/2023   10:45 AM 02/27/2023   10:28 AM 08/23/2022   12:00 AM  CMP  Glucose 70 - 99 mg/dL 87  87    BUN 8 - 23 mg/dL 16  19  22       Creatinine 0.44 - 1.00 mg/dL 7.82  9.56  0.5      Sodium 135 - 145 mmol/L 141  138  140      Potassium 3.5 - 5.1 mmol/L 3.9  4.0  4.1      Chloride 98 - 111 mmol/L 105  104  106      CO2 22 - 32 mmol/L 25  27  25       Calcium 8.9 - 10.3  mg/dL 9.1  9.0  9.1      Total Protein 6.5 - 8.1 g/dL 6.4  6.8    Total Bilirubin 0.0 - 1.2 mg/dL 0.4  0.6    Alkaline Phos 38 - 126 U/L 50  41  47      AST 15 - 41 U/L 16  16  28       ALT 0 - 44 U/L 5  10  9          This result is from an external source.   ASSESSMENT & PLAN:  Assessment/Plan:  A 63 y.o. female with mediastinal and right supraclavicular nodal recurrence of her lung adenocarcinoma, who completed all of her definitive therapy in March 2021.  In clinic today, I went over her chest x-ray images with her, for which she could see she continues to show no evidence of disease recurrence.  Clinically, the patient is doing well. I will see this patient back in 6 months for repeat clinical assessment.  A chest CT will be done a day before her next visit for her continued radiographic lung cancer surveillance.  The patient understands all the plans discussed today and is in agreement with them.    Malasha Kleppe Kirby Funk, MD

## 2023-08-19 ENCOUNTER — Telehealth: Payer: Self-pay | Admitting: Oncology

## 2023-08-19 ENCOUNTER — Other Ambulatory Visit: Payer: Self-pay | Admitting: Oncology

## 2023-08-19 ENCOUNTER — Inpatient Hospital Stay: Payer: Medicare HMO | Attending: Oncology | Admitting: Oncology

## 2023-08-19 VITALS — BP 113/69 | HR 69 | Temp 98.0°F | Resp 16 | Ht 61.0 in | Wt 123.3 lb

## 2023-08-19 DIAGNOSIS — I700 Atherosclerosis of aorta: Secondary | ICD-10-CM | POA: Diagnosis not present

## 2023-08-19 DIAGNOSIS — J439 Emphysema, unspecified: Secondary | ICD-10-CM | POA: Insufficient documentation

## 2023-08-19 DIAGNOSIS — C3411 Malignant neoplasm of upper lobe, right bronchus or lung: Secondary | ICD-10-CM

## 2023-08-19 DIAGNOSIS — Z923 Personal history of irradiation: Secondary | ICD-10-CM | POA: Insufficient documentation

## 2023-08-19 NOTE — Telephone Encounter (Signed)
Patient has been scheduled for follow-up visit per 08/19/23 LOS.  Pt given an appt calendar with date and time.

## 2023-08-28 ENCOUNTER — Telehealth: Payer: Self-pay

## 2023-08-28 NOTE — Telephone Encounter (Signed)
Pt had called pharmacy earlier this morning to inquire if prednisone prescription was ready. Pharmacist LVM on nurse line inquiring if there were plans for prednisone prescription. Dr Melvyn Neth has left for the day, I will touch base with him in the am.

## 2023-08-29 ENCOUNTER — Encounter: Payer: Self-pay | Admitting: Hematology and Oncology

## 2023-09-02 ENCOUNTER — Other Ambulatory Visit: Payer: Self-pay | Admitting: Cardiology

## 2023-09-02 NOTE — Telephone Encounter (Signed)
 Rx refill sent to pharmacy.

## 2023-11-16 ENCOUNTER — Other Ambulatory Visit: Payer: Self-pay | Admitting: Cardiology

## 2023-11-18 NOTE — Telephone Encounter (Signed)
 Rx refill sent to pharmacy.

## 2023-12-02 ENCOUNTER — Telehealth: Payer: Self-pay | Admitting: Cardiology

## 2023-12-02 MED ORDER — CLOPIDOGREL BISULFATE 75 MG PO TABS: 75.0000 mg | ORAL_TABLET | Freq: Every day | ORAL | 2 refills | Status: DC

## 2023-12-02 NOTE — Telephone Encounter (Signed)
 RX sent

## 2023-12-02 NOTE — Telephone Encounter (Signed)
*  STAT* If patient is at the pharmacy, call can be transferred to refill team.   1. Which medications need to be refilled? (please list name of each medication and dose if known)   clopidogrel  (PLAVIX ) 75 MG tablet    2. Which pharmacy/location (including street and city if local pharmacy) is medication to be sent to?  [facility] - [place], [place] - [address]      3. Do they need a 30 day or 90 day supply? 90 day    Pt has been out of medication for a week and a half now.

## 2024-02-15 NOTE — Progress Notes (Unsigned)
 [facility]  [address] [place],  [place]  [address] [phone]  Clinic Day: 02/17/2024  Referring physician: Marelyn Axon, MD  HISTORY OF PRESENT ILLNESS:  The patient is a 63 y.o. female with mediastinal and right supraclavicular nodal recurrence of her previous lung adenocarcinoma.  She did undergo chemoradiation with carboplatin/paclitaxel before completing 1 year of maintenance durvalumab for her disease recurrence in March 2021.  She comes in today to review her chest x-ray that was done as part of her continued radiographic lung cancer surveillance.  Since her last visit, the patient has been doing well.  Although she still coughs, she claims her coughing is nowhere as prominent as it was previously.  She denies having progressive shortness of breath, hemoptysis, or any other respiratory symptoms which concern her for possible disease recurrence.  However, the patient brings to our attention that she was found to be iron  deficient at another physician's office.  She has been taking oral iron , but remains somewhat fatigued.  PHYSICAL EXAM:  There were no vitals taken for this visit. Wt Readings from Last 3 Encounters:  08/19/23 123 lb 4.8 oz (55.9 kg)  07/29/23 123 lb 3.2 oz (55.9 kg)  02/27/23 126 lb 3.2 oz (57.2 kg)   There is no height or weight on file to calculate BMI. Performance status (ECOG): 1 - Symptomatic but completely ambulatory Physical Exam Constitutional:      Appearance: Normal appearance. She is not ill-appearing.  HENT:     Mouth/Throat:     Mouth: Mucous membranes are moist.     Pharynx: Oropharynx is clear. No oropharyngeal exudate or posterior oropharyngeal erythema.  Cardiovascular:     Rate and Rhythm: Normal rate and regular rhythm.     Heart sounds: No murmur heard.    No friction rub. No gallop.  Pulmonary:     Effort: Pulmonary effort is normal. No respiratory distress.     Breath sounds: Normal breath sounds.  No wheezing, rhonchi or rales.  Abdominal:     General: Bowel sounds are normal. There is no distension.     Palpations: Abdomen is soft. There is no mass.     Tenderness: There is no abdominal tenderness.  Musculoskeletal:        General: No swelling.     Right lower leg: No edema.     Left lower leg: No edema.  Lymphadenopathy:     Cervical: No cervical adenopathy.     Upper Body:     Right upper body: No supraclavicular or axillary adenopathy.     Left upper body: No supraclavicular or axillary adenopathy.     Lower Body: No right inguinal adenopathy. No left inguinal adenopathy.  Skin:    General: Skin is warm.     Coloration: Skin is not jaundiced.     Findings: No lesion or rash.  Neurological:     General: No focal deficit present.     Mental Status: She is alert and oriented to person, place, and time. Mental status is at baseline.  Psychiatric:        Mood and Affect: Mood normal.        Behavior: Behavior normal.        Thought Content: Thought content normal.    SCANS: Her chest x-ray done today showed no evidence of disease recurrence.  LABS:      Latest Ref Rng & Units 02/17/2024   11:36 AM 08/15/2023   10:45 AM 02/27/2023  10:28 AM  CBC  WBC 4.0 - 10.5 K/uL 5.4  5.0  4.8   Hemoglobin 12.0 - 15.0 g/dL 9.0  89.1  89.2   Hematocrit 36.0 - 46.0 % 29.3  33.3  34.3   Platelets 150 - 400 K/uL 364  323  312       Latest Ref Rng & Units 02/17/2024   11:36 AM 08/15/2023   10:45 AM 02/27/2023   10:28 AM  CMP  Glucose 70 - 99 mg/dL 89  87  87   BUN 8 - 23 mg/dL 13  16  19    Creatinine 0.44 - 1.00 mg/dL 9.45  9.45  9.42   Sodium 135 - 145 mmol/L 140  141  138   Potassium 3.5 - 5.1 mmol/L 3.9  3.9  4.0   Chloride 98 - 111 mmol/L 105  105  104   CO2 22 - 32 mmol/L 24  25  27    Calcium 8.9 - 10.3 mg/dL 9.4  9.1  9.0   Total Protein 6.5 - 8.1 g/dL 6.7  6.4  6.8   Total Bilirubin 0.0 - 1.2 mg/dL 0.5  0.4  0.6   Alkaline Phos 38 - 126 U/L 54  50  41   AST 15 - 41 U/L  18  16  16    ALT 0 - 44 U/L 10  <5  10    Iron  studies pending  ASSESSMENT & PLAN:  Assessment/Plan:  A 63 y.o. female with mediastinal and right supraclavicular nodal recurrence of her lung adenocarcinoma, who completed all of her definitive therapy in March 2021.  In clinic today, I went over her chest x-ray images with her, for which she could see she continues to show no evidence of disease recurrence.  Clinically, the patient is doing well.  With respect to her anemia, her hemoglobin is lower than what it has been previously.  Her iron  results show  Otherwise, I will see this patient back in March 2026 for repeat clinical assessment.  A chest CT will be done at that time for repeat clinical assessment.  If her chest CT continues to show no evidence of disease recurrence at that time, it will mark her being 5 years cancer free, for which I would consider her cured of her lung cancer.  The patient understands all the plans discussed today and is in agreement with them.    Wandy Bossler DELENA Kerns, MD

## 2024-02-17 ENCOUNTER — Ambulatory Visit (HOSPITAL_BASED_OUTPATIENT_CLINIC_OR_DEPARTMENT_OTHER)
Admission: RE | Admit: 2024-02-17 | Discharge: 2024-02-17 | Disposition: A | Discharge status: Home / Self Care | Source: Ambulatory Visit | Attending: Oncology | Admitting: Oncology

## 2024-02-17 ENCOUNTER — Inpatient Hospital Stay: Payer: Medicare HMO | Attending: Oncology | Admitting: Oncology

## 2024-02-17 ENCOUNTER — Inpatient Hospital Stay

## 2024-02-17 ENCOUNTER — Other Ambulatory Visit: Payer: Self-pay | Admitting: Oncology

## 2024-02-17 DIAGNOSIS — Z79899 Other long term (current) drug therapy: Secondary | ICD-10-CM | POA: Insufficient documentation

## 2024-02-17 DIAGNOSIS — C3411 Malignant neoplasm of upper lobe, right bronchus or lung: Secondary | ICD-10-CM

## 2024-02-17 DIAGNOSIS — D508 Other iron deficiency anemias: Secondary | ICD-10-CM

## 2024-02-17 DIAGNOSIS — Z85118 Personal history of other malignant neoplasm of bronchus and lung: Secondary | ICD-10-CM | POA: Diagnosis present

## 2024-02-17 DIAGNOSIS — D509 Iron deficiency anemia, unspecified: Secondary | ICD-10-CM | POA: Insufficient documentation

## 2024-02-17 LAB — CMP (CANCER CENTER ONLY)
ALT: 10 U/L (ref 0–44)
AST: 18 U/L (ref 15–41)
Albumin: 4.2 g/dL (ref 3.5–5.0)
Alkaline Phosphatase: 54 U/L (ref 38–126)
Anion gap: 11 (ref 5–15)
BUN: 13 mg/dL (ref 8–23)
CO2: 24 mmol/L (ref 22–32)
Calcium: 9.4 mg/dL (ref 8.9–10.3)
Chloride: 105 mmol/L (ref 98–111)
Creatinine: 0.54 mg/dL (ref 0.44–1.00)
GFR, Estimated: >60 mL/min (ref >60)
Glucose, Bld: 89 mg/dL (ref 70–99)
Potassium: 3.9 mmol/L (ref 3.5–5.1)
Sodium: 140 mmol/L (ref 135–145)
Total Bilirubin: 0.5 mg/dL (ref 0.0–1.2)
Total Protein: 6.7 g/dL (ref 6.5–8.1)

## 2024-02-17 LAB — CBC WITH DIFFERENTIAL (CANCER CENTER ONLY)
Abs Immature Granulocytes: 0.01 K/uL (ref 0.00–0.07)
Basophils Absolute: 0 K/uL (ref 0.0–0.1)
Basophils Relative: 1 %
Eosinophils Absolute: 0.1 K/uL (ref 0.0–0.5)
Eosinophils Relative: 2 %
HCT: 29.3 % — ABNORMAL LOW (ref 36.0–46.0)
Hemoglobin: 9 g/dL — ABNORMAL LOW (ref 12.0–15.0)
Immature Granulocytes: 0 %
Lymphocytes Relative: 14 %
Lymphs Abs: 0.7 K/uL (ref 0.7–4.0)
MCH: 24 pg — ABNORMAL LOW (ref 26.0–34.0)
MCHC: 30.7 g/dL (ref 30.0–36.0)
MCV: 78.1 fL — ABNORMAL LOW (ref 80.0–100.0)
Monocytes Absolute: 0.7 K/uL (ref 0.1–1.0)
Monocytes Relative: 13 %
Neutro Abs: 3.8 K/uL (ref 1.7–7.7)
Neutrophils Relative %: 70 %
Platelet Count: 364 K/uL (ref 150–400)
RBC: 3.75 MIL/uL — ABNORMAL LOW (ref 3.87–5.11)
RDW: 13.8 % (ref 11.5–15.5)
WBC Count: 5.4 K/uL (ref 4.0–10.5)
nRBC: 0 % (ref 0.0–0.2)

## 2024-02-17 LAB — IRON AND TIBC
Iron: 13 ug/dL — ABNORMAL LOW (ref 28–170)
Saturation Ratios: 3 % — ABNORMAL LOW (ref 10.4–31.8)
TIBC: 505 ug/dL — ABNORMAL HIGH (ref 250–450)
UIBC: 492 ug/dL

## 2024-02-17 LAB — FERRITIN: Ferritin: 3 ng/mL — ABNORMAL LOW (ref 11–307)

## 2024-02-18 ENCOUNTER — Other Ambulatory Visit: Payer: Self-pay | Admitting: Oncology

## 2024-02-18 ENCOUNTER — Telehealth: Payer: Self-pay

## 2024-02-18 ENCOUNTER — Encounter: Payer: Self-pay | Admitting: Hematology and Oncology

## 2024-02-18 DIAGNOSIS — D508 Other iron deficiency anemias: Secondary | ICD-10-CM

## 2024-02-18 NOTE — Telephone Encounter (Signed)
 Dr Ezzard: Meylin Stenzel/Tammy - Let pt know she is iron  deficient. Also, ask pt if she has had a colonoscopy within the past 5-7 years. If not, have pt referred to GI for iron  deficiency anemia. Please arrange for her to receive IV iron  ASAP. I will see her back in NOV with lab appt.

## 2024-02-19 ENCOUNTER — Encounter: Payer: Self-pay | Admitting: Oncology

## 2024-02-21 ENCOUNTER — Ambulatory Visit

## 2024-02-21 VITALS — BP 121/77 | HR 91 | Temp 98.2°F | Resp 18 | Ht 62.0 in | Wt 120.5 lb

## 2024-02-21 DIAGNOSIS — Z85118 Personal history of other malignant neoplasm of bronchus and lung: Secondary | ICD-10-CM | POA: Diagnosis not present

## 2024-02-21 DIAGNOSIS — D509 Iron deficiency anemia, unspecified: Secondary | ICD-10-CM

## 2024-02-21 MED ORDER — LORATADINE 10 MG PO TABS
10.0000 mg | ORAL_TABLET | Freq: Once | ORAL | Status: AC
  Administered 2024-02-21: 10 mg via ORAL
  Filled 2024-02-21: qty 1

## 2024-02-21 MED ORDER — ACETAMINOPHEN 325 MG PO TABS
650.0000 mg | ORAL_TABLET | Freq: Once | ORAL | Status: AC
  Administered 2024-02-21: 650 mg via ORAL
  Filled 2024-02-21: qty 2

## 2024-02-21 MED ORDER — SODIUM CHLORIDE 0.9 % IV SOLN: INTRAVENOUS | Status: DC

## 2024-02-21 MED ORDER — IRON SUCROSE 20 MG/ML IV SOLN
200.0000 mg | Freq: Once | INTRAVENOUS | Status: AC
Start: 2024-02-21 — End: 2024-02-21
  Administered 2024-02-21: 200 mg via INTRAVENOUS
  Filled 2024-02-21: qty 10

## 2024-02-21 NOTE — Patient Instructions (Signed)

## 2024-02-24 ENCOUNTER — Inpatient Hospital Stay

## 2024-02-25 ENCOUNTER — Inpatient Hospital Stay

## 2024-02-25 VITALS — BP 97/61 | HR 77 | Temp 98.3°F | Resp 18

## 2024-02-25 DIAGNOSIS — Z85118 Personal history of other malignant neoplasm of bronchus and lung: Secondary | ICD-10-CM | POA: Diagnosis not present

## 2024-02-25 DIAGNOSIS — D509 Iron deficiency anemia, unspecified: Secondary | ICD-10-CM

## 2024-02-25 MED ORDER — IRON SUCROSE 20 MG/ML IV SOLN
200.0000 mg | Freq: Once | INTRAVENOUS | Status: AC
  Administered 2024-02-25 (×2): 200 mg via INTRAVENOUS
  Filled 2024-02-25: qty 10

## 2024-02-25 MED ORDER — SODIUM CHLORIDE 0.9 % IV SOLN: INTRAVENOUS | Status: DC

## 2024-02-25 MED ORDER — ACETAMINOPHEN 325 MG PO TABS
650.0000 mg | ORAL_TABLET | Freq: Once | ORAL | Status: AC
  Administered 2024-02-25 (×2): 650 mg via ORAL
  Filled 2024-02-25: qty 2

## 2024-02-25 MED ORDER — LORATADINE 10 MG PO TABS
10.0000 mg | ORAL_TABLET | Freq: Once | ORAL | Status: AC
  Administered 2024-02-25 (×2): 10 mg via ORAL
  Filled 2024-02-25: qty 1

## 2024-02-25 NOTE — Patient Instructions (Signed)

## 2024-02-26 ENCOUNTER — Inpatient Hospital Stay

## 2024-02-26 ENCOUNTER — Encounter: Payer: Self-pay | Admitting: Oncology

## 2024-02-26 VITALS — BP 104/67 | HR 79 | Temp 98.1°F | Resp 18

## 2024-02-26 DIAGNOSIS — D509 Iron deficiency anemia, unspecified: Secondary | ICD-10-CM

## 2024-02-26 DIAGNOSIS — Z85118 Personal history of other malignant neoplasm of bronchus and lung: Secondary | ICD-10-CM | POA: Diagnosis not present

## 2024-02-26 MED ORDER — ACETAMINOPHEN 325 MG PO TABS
650.0000 mg | ORAL_TABLET | Freq: Once | ORAL | Status: AC
  Administered 2024-02-26 (×2): 650 mg via ORAL
  Filled 2024-02-26: qty 2

## 2024-02-26 MED ORDER — IRON SUCROSE 20 MG/ML IV SOLN
200.0000 mg | Freq: Once | INTRAVENOUS | Status: AC
Start: 2024-02-26 — End: 2024-02-26
  Administered 2024-02-26 (×2): 200 mg via INTRAVENOUS
  Filled 2024-02-26: qty 10

## 2024-02-26 MED ORDER — LORATADINE 10 MG PO TABS
10.0000 mg | ORAL_TABLET | Freq: Once | ORAL | Status: AC
  Administered 2024-02-26 (×2): 10 mg via ORAL
  Filled 2024-02-26: qty 1

## 2024-02-26 NOTE — Patient Instructions (Signed)

## 2024-02-27 ENCOUNTER — Inpatient Hospital Stay

## 2024-02-27 VITALS — BP 103/66 | HR 83 | Temp 98.0°F | Resp 20

## 2024-02-27 DIAGNOSIS — Z85118 Personal history of other malignant neoplasm of bronchus and lung: Secondary | ICD-10-CM | POA: Diagnosis not present

## 2024-02-27 DIAGNOSIS — D509 Iron deficiency anemia, unspecified: Secondary | ICD-10-CM

## 2024-02-27 MED ORDER — LORATADINE 10 MG PO TABS
10.0000 mg | ORAL_TABLET | Freq: Once | ORAL | Status: AC
  Administered 2024-02-27: 10 mg via ORAL
  Filled 2024-02-27: qty 1

## 2024-02-27 MED ORDER — ACETAMINOPHEN 325 MG PO TABS
650.0000 mg | ORAL_TABLET | Freq: Once | ORAL | Status: AC
  Administered 2024-02-27: 650 mg via ORAL
  Filled 2024-02-27: qty 2

## 2024-02-27 MED ORDER — IRON SUCROSE 20 MG/ML IV SOLN
200.0000 mg | Freq: Once | INTRAVENOUS | Status: AC
Start: 2024-02-27 — End: 2024-02-27
  Administered 2024-02-27: 200 mg via INTRAVENOUS
  Filled 2024-02-27: qty 10

## 2024-02-27 NOTE — Patient Instructions (Signed)

## 2024-02-28 ENCOUNTER — Telehealth: Payer: Self-pay | Admitting: Cardiology

## 2024-02-28 ENCOUNTER — Inpatient Hospital Stay

## 2024-02-28 ENCOUNTER — Ambulatory Visit

## 2024-02-28 VITALS — BP 101/65 | HR 81 | Temp 98.6°F | Resp 18

## 2024-02-28 DIAGNOSIS — Z85118 Personal history of other malignant neoplasm of bronchus and lung: Secondary | ICD-10-CM | POA: Diagnosis not present

## 2024-02-28 DIAGNOSIS — D509 Iron deficiency anemia, unspecified: Secondary | ICD-10-CM

## 2024-02-28 MED ORDER — IRON SUCROSE 20 MG/ML IV SOLN
200.0000 mg | Freq: Once | INTRAVENOUS | Status: AC
  Administered 2024-02-28: 200 mg via INTRAVENOUS
  Filled 2024-02-28: qty 10

## 2024-02-28 MED ORDER — ACETAMINOPHEN 325 MG PO TABS
650.0000 mg | ORAL_TABLET | Freq: Once | ORAL | Status: AC
  Administered 2024-02-28: 650 mg via ORAL
  Filled 2024-02-28: qty 2

## 2024-02-28 MED ORDER — LORATADINE 10 MG PO TABS
10.0000 mg | ORAL_TABLET | Freq: Once | ORAL | Status: AC
  Administered 2024-02-28: 10 mg via ORAL
  Filled 2024-02-28: qty 1

## 2024-02-28 NOTE — Progress Notes (Signed)
 Pt d/c stable, declined 30 min wait time

## 2024-02-28 NOTE — Telephone Encounter (Signed)
 The patient was notified that her clopidogrel prescription was [facility] to [facility] on 12/02/[facility]23 for 90 days with 2 refills.

## 2024-02-28 NOTE — Patient Instructions (Signed)

## 2024-02-28 NOTE — Telephone Encounter (Signed)
*  STAT* If patient is at the pharmacy, call can be transferred to refill team.   1. Which medications need to be refilled? (please list name of each medication and dose if known) clopidogrel (PLAVIX) 75 MG tablet  2. Which pharmacy/location (including street and city if local pharmacy) is medication to be sent to? [facility] - [place], [place] - [address]  3. Do they need a 30 day or 90 day supply? 90 day supply

## 2024-05-18 NOTE — Progress Notes (Deleted)
 [facility]  [address] [place],  [place]  [address] [phone]  Clinic Day: 02/17/2024  Referring physician: Marelyn Axon, MD  HISTORY OF PRESENT ILLNESS:  The patient is a 63 y.o. female with mediastinal and right supraclavicular nodal recurrence of her previous lung adenocarcinoma.  The patient was also found to be iron  deficient recently for which she was just given IV iron  in August 2025    She did undergo chemoradiation with carboplatin/paclitaxel before completing 1 year of maintenance durvalumab for her disease recurrence in March 2021.  She comes in today to review her chest x-ray that was done as part of her continued radiographic lung cancer surveillance.  Since her last visit, the patient has been doing well.  Although she still coughs, she claims her coughing is nowhere as prominent as it was previously.  She denies having progressive shortness of breath, hemoptysis, or any other respiratory symptoms which concern her for possible disease recurrence.  However, the patient brings to our attention that she was found to be iron  deficient at another physician's office.  She has been taking oral iron , but remains somewhat fatigued.  PHYSICAL EXAM:  There were no vitals taken for this visit. Wt Readings from Last 3 Encounters:  02/21/24 120 lb 8 oz (54.7 kg)  08/19/23 123 lb 4.8 oz (55.9 kg)  07/29/23 123 lb 3.2 oz (55.9 kg)   There is no height or weight on file to calculate BMI. Performance status (ECOG): 1 - Symptomatic but completely ambulatory Physical Exam Constitutional:      Appearance: Normal appearance. She is not ill-appearing.  HENT:     Mouth/Throat:     Mouth: Mucous membranes are moist.     Pharynx: Oropharynx is clear. No oropharyngeal exudate or posterior oropharyngeal erythema.  Cardiovascular:     Rate and Rhythm: Normal rate and regular rhythm.     Heart sounds: No murmur heard.    No friction rub. No gallop.   Pulmonary:     Effort: Pulmonary effort is normal. No respiratory distress.     Breath sounds: Normal breath sounds. No wheezing, rhonchi or rales.  Abdominal:     General: Bowel sounds are normal. There is no distension.     Palpations: Abdomen is soft. There is no mass.     Tenderness: There is no abdominal tenderness.  Musculoskeletal:        General: No swelling.     Right lower leg: No edema.     Left lower leg: No edema.  Lymphadenopathy:     Cervical: No cervical adenopathy.     Upper Body:     Right upper body: No supraclavicular or axillary adenopathy.     Left upper body: No supraclavicular or axillary adenopathy.     Lower Body: No right inguinal adenopathy. No left inguinal adenopathy.  Skin:    General: Skin is warm.     Coloration: Skin is not jaundiced.     Findings: No lesion or rash.  Neurological:     General: No focal deficit present.     Mental Status: She is alert and oriented to person, place, and time. Mental status is at baseline.  Psychiatric:        Mood and Affect: Mood normal.        Behavior: Behavior normal.        Thought Content: Thought content normal.    SCANS: Her chest x-ray done today showed no evidence of disease recurrence.  LABS:      Latest Ref Rng & Units 02/17/2024   11:36 AM 08/15/2023   10:45 AM 02/27/2023   10:28 AM  CBC  WBC 4.0 - 10.5 K/uL 5.4  5.0  4.8   Hemoglobin 12.0 - 15.0 g/dL 9.0  89.1  89.2   Hematocrit 36.0 - 46.0 % 29.3  33.3  34.3   Platelets 150 - 400 K/uL 364  323  312       Latest Ref Rng & Units 02/17/2024   11:36 AM 08/15/2023   10:45 AM 02/27/2023   10:28 AM  CMP  Glucose 70 - 99 mg/dL 89  87  87   BUN 8 - 23 mg/dL 13  16  19    Creatinine 0.44 - 1.00 mg/dL 9.45  9.45  9.42   Sodium 135 - 145 mmol/L 140  141  138   Potassium 3.5 - 5.1 mmol/L 3.9  3.9  4.0   Chloride 98 - 111 mmol/L 105  105  104   CO2 22 - 32 mmol/L 24  25  27    Calcium 8.9 - 10.3 mg/dL 9.4  9.1  9.0   Total Protein 6.5 - 8.1 g/dL 6.7   6.4  6.8   Total Bilirubin 0.0 - 1.2 mg/dL 0.5  0.4  0.6   Alkaline Phos 38 - 126 U/L 54  50  41   AST 15 - 41 U/L 18  16  16    ALT 0 - 44 U/L 10  <5  10     Latest Reference Range & Units 02/17/24 11:36  Iron  28 - 170 ug/dL 13 (L)  UIBC ug/dL 507  TIBC 749 - 549 ug/dL 494 (H)  Saturation Ratios 10.4 - 31.8 % 3 (L)  Ferritin 11 - 307 ng/mL 3 (L)  (L): Data is abnormally low (H): Data is abnormally high  ASSESSMENT & PLAN:  Assessment/Plan:  A 63 y.o. female with mediastinal and right supraclavicular nodal recurrence of her lung adenocarcinoma, who completed all of her definitive therapy in March 2021.  In clinic today, I went over her chest x-ray images with her, for which she could see she continues to show no evidence of disease recurrence.  Clinically, the patient is doing well.  With respect to her anemia, her hemoglobin is lower than what it has been previously.  Her iron  results clearly show her to be iron  deficient, for which I will arrange for her to receive IV iron .  I will also refer her to GI to ensure her iron  deficiency anemia is not due to an occult GI tract process.  With respect to her iron  deficiency anemia, I will see her back in 3 months to see how well she responded to her IV iron .  As it pertains to her lung cancer, I will see this patient back in March 2026 for repeat clinical assessment.  A chest CT will be done at that time for repeat clinical assessment.  If her chest CT continues to show no evidence of disease recurrence at that time, it will mark her being 5 years cancer free, for which I would consider her cured of her lung cancer.  The patient understands all the plans discussed today and is in agreement with them.    Morena Mckissack DELENA Kerns, MD

## 2024-05-19 ENCOUNTER — Inpatient Hospital Stay: Admitting: Oncology

## 2024-05-19 ENCOUNTER — Inpatient Hospital Stay: Attending: Oncology

## 2024-05-19 DIAGNOSIS — Z85118 Personal history of other malignant neoplasm of bronchus and lung: Secondary | ICD-10-CM | POA: Insufficient documentation

## 2024-05-19 DIAGNOSIS — D509 Iron deficiency anemia, unspecified: Secondary | ICD-10-CM | POA: Insufficient documentation

## 2024-06-03 NOTE — Progress Notes (Signed)
 [facility]  [address] [place],  [place]  [address] [phone]  Clinic Day:  06/04/2024  Referring physician: Marelyn Axon, MD  HISTORY OF PRESENT ILLNESS:  The patient is a 63 y.o. female with mediastinal and right supraclavicular nodal recurrence of her previous lung adenocarcinoma.  The patient was also found to be iron  deficient recently for which she was just given IV iron  in August 2025.  She comes in today to reassess her iron  and hemoglobin levels to see how well she responded to her recent IV iron .  Overall, the patient claims to be doing much better since her IV iron  was good.  She denies having any overt forms of blood loss.    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  She underwent 4 cycles of neoadjuvant cisplatin/Alimta chemotherapy before undergoing a right upper lobectomy in September 2016.  Unfortunately, she was found to his right supraclavicular and mediastinal nodal recurrence of her lung cancer in 2019, which led to her undergoing chemoradiation with carboplatin/paclitaxel before completing 1 year of maintenance durvalumabin March 2021.  She denies having progressive shortness of breath, hemoptysis, or any other respiratory symptoms which concern her for possible disease recurrence.    PHYSICAL EXAM:  Blood pressure 105/69, pulse 85, temperature 98.1 F (36.7 C), temperature source Oral, resp. rate 14, height 5' 1 (1.549 m), weight 122 lb 8 oz (55.6 kg), SpO2 100%. Wt Readings from Last 3 Encounters:  06/04/24 122 lb 8 oz (55.6 kg)  02/21/24 120 lb 8 oz (54.7 kg)  08/19/23 123 lb 4.8 oz (55.9 kg)   Body mass index is 23.15 kg/m. Performance status (ECOG): 1 - Symptomatic but completely ambulatory Physical Exam Constitutional:      Appearance: Normal appearance. She is not ill-appearing.  HENT:     Mouth/Throat:     Mouth: Mucous  membranes are moist.     Pharynx: Oropharynx is clear. No oropharyngeal exudate or posterior oropharyngeal erythema.  Cardiovascular:     Rate and Rhythm: Normal rate and regular rhythm.     Heart sounds: No murmur heard.    No friction rub. No gallop.  Pulmonary:     Effort: Pulmonary effort is normal. No respiratory distress.     Breath sounds: Normal breath sounds. No wheezing, rhonchi or rales.  Abdominal:     General: Bowel sounds are normal. There is no distension.     Palpations: Abdomen is soft. There is no mass.     Tenderness: There is no abdominal tenderness.  Musculoskeletal:        General: No swelling.     Right lower leg: No edema.     Left lower leg: No edema.  Lymphadenopathy:     Cervical: No cervical adenopathy.     Upper Body:     Right upper body: No supraclavicular or axillary adenopathy.     Left upper body: No supraclavicular or axillary adenopathy.     Lower Body: No right inguinal adenopathy. No left inguinal adenopathy.  Skin:    General: Skin is warm.     Coloration: Skin is not jaundiced.     Findings: No lesion or rash.  Neurological:     General: No focal deficit present.     Mental Status: She is alert and oriented to person, place, and time. Mental status is at baseline.  Psychiatric:  Mood and Affect: Mood normal.        Behavior: Behavior normal.        Thought Content: Thought content normal.    LABS:      Latest Ref Rng & Units 06/04/2024    1:20 PM 02/17/2024   11:36 AM 08/15/2023   10:45 AM  CBC  WBC 4.0 - 10.5 K/uL 5.1  5.4  5.0   Hemoglobin 12.0 - 15.0 g/dL 87.7  9.0  89.1   Hematocrit 36.0 - 46.0 % 37.3  29.3  33.3   Platelets 150 - 400 K/uL 341  364  323       Latest Ref Rng & Units 02/17/2024   11:36 AM 08/15/2023   10:45 AM 02/27/2023   10:28 AM  CMP  Glucose 70 - 99 mg/dL 89  87  87   BUN 8 - 23 mg/dL 13  16  19    Creatinine 0.44 - 1.00 mg/dL 9.45  9.45  9.42   Sodium 135 - 145 mmol/L 140  141  138   Potassium 3.5  - 5.1 mmol/L 3.9  3.9  4.0   Chloride 98 - 111 mmol/L 105  105  104   CO2 22 - 32 mmol/L 24  25  27    Calcium 8.9 - 10.3 mg/dL 9.4  9.1  9.0   Total Protein 6.5 - 8.1 g/dL 6.7  6.4  6.8   Total Bilirubin 0.0 - 1.2 mg/dL 0.5  0.4  0.6   Alkaline Phos 38 - 126 U/L 54  50  41   AST 15 - 41 U/L 18  16  16    ALT 0 - 44 U/L 10  <5  10     Latest Reference Range & Units 02/17/24 11:36 06/04/24 13:20  Iron  28 - 170 ug/dL 13 (L) 38  UIBC ug/dL 507 715  TIBC 749 - 549 ug/dL 494 (H) 677  Saturation Ratios 10.4 - 31.8 % 3 (L) 12  Ferritin 11 - 307 ng/mL 3 (L) 130  (L): Data is abnormally low (H): Data is abnormally high  ASSESSMENT & PLAN:  Assessment/Plan:  A 63 y.o. female with mediastinal and right supraclavicular nodal recurrence of her lung adenocarcinoma, who completed all of her definitive therapy in March 2021.  However, this visit today was primarily focused on her  iron  deficiency anemia.  Her labs today clearly show an improvement in her iron  and hemoglobin levels after receiving her IV iron .  Clinically, she is doing much better.  As it pertains to her lung cancer surveillance, I will see this patient back in March 2026 for repeat clinical assessment.  A chest CT will be done at that time for repeat clinical assessment.  If her chest CT continues to show no evidence of disease recurrence at that time, it will mark her being 5 years cancer free, for which I would consider her cured of her lung cancer.  The patient understands all the plans discussed today and is in agreement with them.    Aedin Jeansonne DELENA Kerns, MD

## 2024-06-04 ENCOUNTER — Other Ambulatory Visit: Payer: Self-pay | Admitting: Oncology

## 2024-06-04 ENCOUNTER — Telehealth: Payer: Self-pay | Admitting: Oncology

## 2024-06-04 ENCOUNTER — Inpatient Hospital Stay (HOSPITAL_BASED_OUTPATIENT_CLINIC_OR_DEPARTMENT_OTHER): Admitting: Oncology

## 2024-06-04 ENCOUNTER — Inpatient Hospital Stay

## 2024-06-04 VITALS — BP 105/69 | HR 85 | Temp 98.1°F | Resp 14 | Ht 61.0 in | Wt 122.5 lb

## 2024-06-04 DIAGNOSIS — D508 Other iron deficiency anemias: Secondary | ICD-10-CM | POA: Diagnosis not present

## 2024-06-04 DIAGNOSIS — Z85118 Personal history of other malignant neoplasm of bronchus and lung: Secondary | ICD-10-CM | POA: Diagnosis present

## 2024-06-04 DIAGNOSIS — D509 Iron deficiency anemia, unspecified: Secondary | ICD-10-CM | POA: Diagnosis not present

## 2024-06-04 DIAGNOSIS — C3401 Malignant neoplasm of right main bronchus: Secondary | ICD-10-CM

## 2024-06-04 LAB — CBC WITH DIFFERENTIAL (CANCER CENTER ONLY)
Abs Immature Granulocytes: 0.02 K/uL (ref 0.00–0.07)
Basophils Absolute: 0 K/uL (ref 0.0–0.1)
Basophils Relative: 1 %
Eosinophils Absolute: 0.1 K/uL (ref 0.0–0.5)
Eosinophils Relative: 2 %
HCT: 37.3 % (ref 36.0–46.0)
Hemoglobin: 12.2 g/dL (ref 12.0–15.0)
Immature Granulocytes: 0 %
Lymphocytes Relative: 19 %
Lymphs Abs: 0.9 K/uL (ref 0.7–4.0)
MCH: 28.4 pg (ref 26.0–34.0)
MCHC: 32.7 g/dL (ref 30.0–36.0)
MCV: 86.7 fL (ref 80.0–100.0)
Monocytes Absolute: 0.5 K/uL (ref 0.1–1.0)
Monocytes Relative: 10 %
Neutro Abs: 3.5 K/uL (ref 1.7–7.7)
Neutrophils Relative %: 68 %
Platelet Count: 341 K/uL (ref 150–400)
RBC: 4.3 MIL/uL (ref 3.87–5.11)
RDW: 13.3 % (ref 11.5–15.5)
WBC Count: 5.1 K/uL (ref 4.0–10.5)
nRBC: 0 % (ref 0.0–0.2)

## 2024-06-04 LAB — IRON AND TIBC
Iron: 38 ug/dL (ref 28–170)
Saturation Ratios: 12 % (ref 10.4–31.8)
TIBC: 322 ug/dL (ref 250–450)
UIBC: 284 ug/dL

## 2024-06-04 LAB — FERRITIN: Ferritin: 130 ng/mL (ref 11–307)

## 2024-06-04 NOTE — Telephone Encounter (Signed)
 Patient has been scheduled for follow-up visit per 05/2024 LOS.  Pt given an appt calendar with date and time.

## 2024-06-13 ENCOUNTER — Encounter: Payer: Self-pay | Admitting: Oncology

## 2024-06-16 ENCOUNTER — Encounter (HOSPITAL_BASED_OUTPATIENT_CLINIC_OR_DEPARTMENT_OTHER): Payer: Self-pay

## 2024-06-24 NOTE — Telephone Encounter (Signed)
 Medication Access Center Summary  PA Status PA already on file  Medication Dupixent 300mg /80ml Pens  Provider Ricka Hammed  Key PA Reference [facility]    Pharmacy Benefit Manager [facility]  Additional Information: The patient currently has access to the requested medication and a Prior Authorization is not needed for the patient/medication.   I ran a test claim and medication was filled on 06/23/24 at [facility] and PA is in place until 07/15/25

## 2024-08-11 NOTE — Progress Notes (Unsigned)
 " Cardiology Office Note:    Date:  08/12/2024   ID:  Michelle King, DOB [birth year], MRN [id number]  PCP:  Marelyn Axon, MD  Cardiologist:  Redell Leiter, MD    Referring MD: Marelyn Axon, MD    ASSESSMENT:    1. Coronary artery disease involving native coronary artery of native heart with angina pectoris   2. Chronic obstructive pulmonary disease, unspecified COPD type (HCC)   3. Mixed hyperlipidemia   4. Statin intolerance    PLAN:    In order of problems listed above:  Stable CAD [place]  association class last 1 doing well long-term after remote anterior STEMI PCI stent initially severe LV dysfunction with normalized EF Continue treatment including clopidogrel  currently on bempedoic acid  therapy check a lipid profile today including ApoB No recurrence of syncope   Next appointment: Plan to see him in 1 year   Medication Adjustments/Labs and Tests Ordered: Current medicines are reviewed at length with the patient today.  Concerns regarding medicines are outlined above.  Orders Placed This Encounter  Procedures   EKG 12-Lead   No orders of the defined types were placed in this encounter.    History of Present Illness:    [name] is a 64 y.o. female with a hx of CAD with ST elevation MI PCI and stent proximal LAD in 2010 initial severe cardiomyopathy with subsequent recovery ejection fraction on guideline directed therapy COPD lung cancer mediastinal and supraclavicular recurrence dyslipidemia with elevated LP(a) statin intolerance on bempedoic acid  and chronic urticaria  last seen 07/29/2023.  Echocardiogram 08/16/2023 showed EF 50 to 55% no segmental wall motion abnormality mildly reduced GLS.  She was seen in the emergency room at [facility] 07/19/2024.  She presented with complaints of cough and said that she fainted having a paroxysm of coughing.  Her heart rate was 125 bpm blood pressure is 130/80 saturation was 99%.  I reviewed the  EKG which showed sinus tachycardia poor R wave progression low voltage no acute ischemic changes.  Chest x-ray showed volume loss in the right chest following resection mild cardiomegaly was present.  Appears like she decided to leave the hospital before finishing her evaluation.  Compliance with diet, lifestyle and medications: Yes  Her episode of syncope was clearly situational and occurred with a severe cough paroxysm brief momentary no recurrence she had a respiratory infection Otherwise from a cardiac perspective is done exceptionally well has no symptoms of edema shortness of breath orthopnea chest pain palpitation or recurrent syncope She tolerates lipid-lowering therapy and is due to repeat her labs today including lipids LDL and ApoB She will continue clopidogrel  long-term antiplatelet therapy I told her and her husband if she ever feels like she is going to faint to place herself supine and he has to lift her legs in the air I do not think she requires a cardiac monitor and repeat ischemia evaluation with a single isolated situational episode Past Medical History:  Diagnosis Date   Angio-edema 09/05/2016   Arthritis    spine   CAD (coronary artery disease)    Cancer (HCC)    lung   Chronic cholecystitis 04/22/2019   Chronic cough 06/02/2021   Chronic idiopathic urticaria 09/05/2016   Complication of anesthesia    COPD (chronic obstructive pulmonary disease) (HCC)    Coronary artery disease involving native coronary artery of native heart with angina pectoris    Overview:  Anterior MI 2010 with PCI and stent Her last  stress test pharmacologic showed normal ejection fraction and no evidence of ischemia. She has a history of previous LAD infarction with severely depressed ejection fraction that normalized with medical therapy and revascularization percutaneously.   Family history of colon cancer    Hematochezia 04/14/2015   History of bilateral tubal ligation    Hyperlipidemia     Intrinsic atopic dermatitis 02/26/2021   Iron  deficiency anemia 03/11/2018   Laryngopharyngeal reflux (LPR) 10/18/2021   Lichen sclerosus 09/30/2022   Lichen sclerosus et atrophicus of the vulva 02/26/2021   Lichen simplex chronicus 01/12/2022   Lung cancer-right upper lobe 11/15/2014   Myocardial infarction (HCC) 2010   Obstructive chronic bronchitis without exacerbation (HCC) 03/01/2015   Pneumonia    hx   PONV (postoperative nausea and vomiting)    Postoperative examination 05/21/2018   Preoperative cardiovascular examination 03/02/2015   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   Pruritus 02/26/2021   Pure hypercholesterolemia 03/01/2015   Radiation fibrosis of lung    Restless legs 03/11/2018   S/P hysterectomy    S/P lobectomy of lung 03/25/2015   Statin intolerance 03/10/2018    Current Medications: Active Medications[1]    EKGs/Labs/Other Studies Reviewed:    The following studies were reviewed today:  Cardiac Studies & Procedures   ______________________________________________________________________________________________     ECHOCARDIOGRAM  ECHOCARDIOGRAM COMPLETE 08/16/2023  Narrative ECHOCARDIOGRAM REPORT    Patient Name:   Michelle King Date of Exam: 08/16/2023 Medical Rec #:  [id number]                      Height:       61.0 in Accession #:    [id number]                     Weight:       123.2 lb Date of Birth:  July 18, 1960                      BSA:          1.537 m Patient Age:    64 years                       BP:           118/72 mmHg Patient Gender: F                              HR:           76 bpm. Exam Location:  [facility]  Procedure: 2D Echo, Cardiac Doppler, Color Doppler and Strain Analysis  Indications:    Coronary artery disease involving native coronary artery of native heart with angina pectoris (HCC) [I25.119 (ICD-10-CM)]; Mixed hyperlipidemia [E78.2 (ICD-10-CM)]; Statin intolerance [Z78.9 (ICD-10-CM)]; Chronic obstructive  pulmonary disease, unspecified COPD type (HCC) [J44.9 (ICD-10-CM)]; Malignant neoplasm of upper lobe of right lung (HCC) [C34.11 (ICD-10-CM)]  History:        Patient has no prior history of Echocardiogram examinations. CAD, COPD; Risk Factors:Dyslipidemia. Malignant neoplasm of upper lobe of right lung (HCC).  Sonographer:    Charlie Jointer RDCS Referring Phys: [id number] Amarionna Arca J Tayshaun Kroh  IMPRESSIONS   1. Left ventricular ejection fraction, by estimation, is 50 to 55%. The left ventricle has low normal function. The left ventricle has no regional wall motion abnormalities. Left ventricular diastolic parameters were normal. The average left ventricular global longitudinal strain is 14.6 %. The global  longitudinal strain is abnormal. 2. Right ventricular systolic function is normal. The right ventricular size is normal. 3. The mitral valve is normal in structure. Mild mitral valve regurgitation. No evidence of mitral stenosis. 4. The aortic valve is tricuspid. Aortic valve regurgitation is not visualized. No aortic stenosis is present. 5. Aortic Normal ascending arch and decending thoracic aorta. There is mild dilatation of the aortic root, measuring 36 mm. 6. The inferior vena cava is normal in size with greater than 50% respiratory variability, suggesting right atrial pressure of 3 mmHg.  FINDINGS Left Ventricle: Left ventricular ejection fraction, by estimation, is 50 to 55%. The left ventricle has low normal function. The left ventricle has no regional wall motion abnormalities. The average left ventricular global longitudinal strain is 14.6 %. The global longitudinal strain is abnormal. The left ventricular internal cavity size was normal in size. There is no left ventricular hypertrophy. Left ventricular diastolic parameters were normal. Normal left ventricular filling pressure.  Right Ventricle: The right ventricular size is normal. No increase in right ventricular wall thickness. Right  ventricular systolic function is normal.  Left Atrium: Left atrial size was normal in size.  Right Atrium: Right atrial size was normal in size.  Pericardium: Trivial pericardial effusion is present. The pericardial effusion is posterior to the left ventricle.  Mitral Valve: The mitral valve is normal in structure. Mild mitral valve regurgitation. No evidence of mitral valve stenosis.  Tricuspid Valve: The tricuspid valve is normal in structure. Tricuspid valve regurgitation is not demonstrated. No evidence of tricuspid stenosis.  Aortic Valve: The aortic valve is tricuspid. Aortic valve regurgitation is not visualized. No aortic stenosis is present.  Pulmonic Valve: The pulmonic valve was normal in structure. Pulmonic valve regurgitation is trivial. No evidence of pulmonic stenosis.  Aorta: Normal ascending arch and decending thoracic aorta. There is mild dilatation of the aortic root, measuring 36 mm.  Venous: A normal flow pattern is recorded from the right upper pulmonary vein. The inferior vena cava is normal in size with greater than 50% respiratory variability, suggesting right atrial pressure of 3 mmHg.  IAS/Shunts: No atrial level shunt detected by color flow Doppler.   LEFT VENTRICLE PLAX 2D LVIDd:         4.50 cm   Diastology LVIDs:         2.80 cm   LV e' medial:    8.59 cm/s LV PW:         0.80 cm   LV E/e' medial:  10.6 LV IVS:        0.90 cm   LV e' lateral:   12.27 cm/s LVOT diam:     1.90 cm   LV E/e' lateral: 7.4 LV SV:         55 LV SV Index:   36        2D Longitudinal Strain LVOT Area:     2.84 cm  2D Strain GLS Avg:     14.6 %   RIGHT VENTRICLE             IVC RV Basal diam:  2.90 cm     IVC diam: 1.70 cm RV Mid diam:    2.70 cm RV S prime:     10.80 cm/s TAPSE (M-mode): 1.4 cm  LEFT ATRIUM             Index        RIGHT ATRIUM           Index LA  diam:        2.20 cm 1.43 cm/m   RA Area:     10.80 cm LA Vol (A2C):   18.6 ml 12.10 ml/m  RA Volume:    22.30 ml  14.51 ml/m LA Vol (A4C):   28.3 ml 18.41 ml/m LA Biplane Vol: 25.3 ml 16.46 ml/m AORTIC VALVE LVOT Vmax:   85.65 cm/s LVOT Vmean:  58.200 cm/s LVOT VTI:    0.194 m  AORTA Ao Root diam: 3.60 cm Ao Asc diam:  3.10 cm Ao Desc diam: 2.10 cm  MITRAL VALVE MV Area (PHT): 4.26 cm    SHUNTS MV Decel Time: 178 msec    Systemic VTI:  0.19 m MR Peak grad: 27.9 mmHg    Systemic Diam: 1.90 cm MR Vmax:      264.00 cm/s MV E velocity: 90.90 cm/s MV A velocity: 88.55 cm/s MV E/A ratio:  1.03  Redell Leiter MD Electronically signed by Redell Leiter MD Signature Date/Time: 08/16/2023/12:48:54 PM    Final        CARDIAC MRI  MR CARDIAC MORPHOLOGY W WO CONTRAST 05/17/2009  Narrative Cardiac MRI  Indication: Infuse Research Study  Protocol:  The patient was scanned on a 1.5 Tesla GE magnet.  A dedicated cardiac coil was used.  Functional imaging was done using Fiesta sequences.  2,3, and 4 chamber views were done to assess RWMA's.  The patient received 20cc of magnevist.  After 10 minutes inversion recovery sequences were done to assess for scar tissue and viability  Findings:  Right sided cardiac chambers were normal.  There was mild LAE and mild LV enlargement.  The septum and apex were thinned and hypokinetic.  There was 2/3rd thickness scar involving the anteroseptum and apex.  There was no mural apical clot.  The quantitative EF was 50%.  ( ESV 60cc, EDV 119 cc, SV 60cc)  Impression:  Mild LVE with EF 50% Hypokinesis and thinning of the anteroseptum and apex.  2/3rd thckness scar involveing this area.  Provider: Tilford Mangle   ______________________________________________________________________________________________      EKG Interpretation Date/Time:  Wednesday August 12 2024 13:00:16 EST Ventricular Rate:  88 PR Interval:  150 QRS Duration:  66 QT Interval:  348 QTC Calculation: 421 R Axis:   -25  Text Interpretation: Normal sinus rhythm Low  voltage QRS When compared with ECG of 29-Jul-2023 13:17, No significant change was found Confirmed by Leiter Redell ([id number]) on 08/12/2024 1:07:33 PM   Recent Labs: 02/17/2024: ALT 10; BUN 13; Creatinine 0.54; Potassium 3.9; Sodium 140 06/04/2024: Hemoglobin 12.2; Platelet Count 341  Recent Lipid Panel    Component Value Date/Time   CHOL 174 07/29/2023 1351   TRIG 117 07/29/2023 1351   HDL 47 07/29/2023 1351   CHOLHDL 3.7 07/29/2023 1351   CHOLHDL 5.3 04/19/2009 1130   VLDL 23 04/19/2009 1130   LDLCALC 106 (H) 07/29/2023 1351    Physical Exam:    VS:  BP 102/70   Pulse 88   Ht 5' 1 (1.549 m)   Wt 125 lb 3.2 oz (56.8 kg)   SpO2 97%   BMI 23.66 kg/m     Wt Readings from Last 3 Encounters:  08/12/24 125 lb 3.2 oz (56.8 kg)  06/04/24 122 lb 8 oz (55.6 kg)  02/21/24 120 lb 8 oz (54.7 kg)     GEN: Marked improvement in overall appearance she looks healthy well nourished, well developed in no acute distress HEENT: Normal NECK: No JVD; No carotid  bruits LYMPHATICS: No lymphadenopathy CARDIAC: RRR, no murmurs, rubs, gallops RESPIRATORY:  Clear to auscultation without rales, wheezing or rhonchi  ABDOMEN: Soft, non-tender, non-distended MUSCULOSKELETAL:  No edema; No deformity  SKIN: Warm and dry NEUROLOGIC:  Alert and oriented x 3 PSYCHIATRIC:  Normal affect    Signed, Redell Leiter, MD  08/12/2024 1:21 PM    [facility]      [1]  Current Meds  Medication Sig   albuterol  (PROVENTIL ) (2.5 MG/3ML) 0.083% nebulizer solution SMARTSIG:1 Vial(s) Every 6 Hours   albuterol  (VENTOLIN  HFA) 108 (90 Base) MCG/ACT inhaler Inhale 2 puffs into the lungs every 4 (four) hours as needed for wheezing or shortness of breath.   Bempedoic Acid  (NEXLETOL ) 180 MG TABS Take 1 tablet by mouth once daily   betamethasone valerate ointment (VALISONE) 0.1 % Apply 1 Application topically 2 (two) times daily.   clobetasol cream (TEMOVATE) 0.05 % Apply 1 Application topically 2  (two) times daily.   clopidogrel  (PLAVIX ) 75 MG tablet Take 1 tablet (75 mg total) by mouth daily.   Dupilumab 300 MG/2ML SOAJ Inject 300 mg into the skin.   fluticasone  furoate-vilanterol (BREO ELLIPTA ) 100-25 MCG/ACT AEPB Inhale 1 puff into the lungs as needed.   gabapentin  (NEURONTIN ) 100 MG capsule Take 100 mg by mouth 2 (two) times daily.    ondansetron  (ZOFRAN -ODT) 8 MG disintegrating tablet Take 8 mg by mouth every 8 (eight) hours as needed.   triamcinolone cream (KENALOG) 0.1 % Apply 1 Application topically daily.   "

## 2024-08-12 ENCOUNTER — Encounter: Payer: Self-pay | Admitting: Cardiology

## 2024-08-12 ENCOUNTER — Ambulatory Visit: Attending: Cardiology | Admitting: Cardiology

## 2024-08-12 VITALS — BP 102/70 | HR 88 | Ht 61.0 in | Wt 125.2 lb

## 2024-08-12 DIAGNOSIS — I25119 Atherosclerotic heart disease of native coronary artery with unspecified angina pectoris: Secondary | ICD-10-CM

## 2024-08-12 DIAGNOSIS — E782 Mixed hyperlipidemia: Secondary | ICD-10-CM

## 2024-08-12 DIAGNOSIS — J449 Chronic obstructive pulmonary disease, unspecified: Secondary | ICD-10-CM | POA: Diagnosis not present

## 2024-08-12 DIAGNOSIS — Z789 Other specified health status: Secondary | ICD-10-CM | POA: Diagnosis not present

## 2024-08-12 MED ORDER — CLOPIDOGREL BISULFATE 75 MG PO TABS: 75.0000 mg | ORAL_TABLET | Freq: Every day | ORAL | 3 refills | Status: AC

## 2024-08-12 NOTE — Patient Instructions (Signed)
 Medication Instructions:  Your physician recommends that you continue on your current medications as directed. Please refer to the Current Medication list given to you today.  *If you need a refill on your cardiac medications before your next appointment, please call your pharmacy*  Lab Work: Your physician recommends that you return for lab work in:   Labs today: CMP, Lipids, Apo B  If you have labs (blood work) drawn today and your tests are completely normal, you will receive your results only by: MyChart Message (if you have MyChart) OR A paper copy in the mail If you have any lab test that is abnormal or we need to change your treatment, we will call you to review the results.  Testing/Procedures: None  Follow-Up: At [facility], you and your health needs are our priority.  As part of our continuing mission to provide you with exceptional heart care, our providers are all part of one team.  This team includes your primary Cardiologist (physician) and Advanced Practice Providers or APPs (Physician Assistants and Nurse Practitioners) who all work together to provide you with the care you need, when you need it.  Your next appointment:   1 year(s)  Provider:   Norman Herrlich, MD    We recommend signing up for the patient portal called "MyChart".  Sign up information is provided on this After Visit Summary.  MyChart is used to connect with patients for Virtual Visits (Telemedicine).  Patients are able to view lab/test results, encounter notes, upcoming appointments, etc.  Non-urgent messages can be sent to your provider as well.   To learn more about what you can do with MyChart, go to [email or web address].   Other Instructions None

## 2024-08-13 ENCOUNTER — Ambulatory Visit: Payer: Self-pay | Admitting: Cardiology

## 2024-08-13 LAB — COMPREHENSIVE METABOLIC PANEL WITH GFR
ALT: 7 [IU]/L (ref 0–32)
AST: 13 [IU]/L (ref 0–40)
Albumin: 4.2 g/dL (ref 3.9–4.9)
Alkaline Phosphatase: 58 [IU]/L (ref 49–135)
BUN/Creatinine Ratio: 25 (ref 12–28)
BUN: 16 mg/dL (ref 8–27)
Bilirubin Total: 0.3 mg/dL (ref 0.0–1.2)
CO2: 25 mmol/L (ref 20–29)
Calcium: 9.6 mg/dL (ref 8.7–10.3)
Chloride: 106 mmol/L (ref 96–106)
Creatinine, Ser: 0.63 mg/dL (ref 0.57–1.00)
Globulin, Total: 2 g/dL (ref 1.5–4.5)
Glucose: 98 mg/dL (ref 70–99)
Potassium: 4.6 mmol/L (ref 3.5–5.2)
Sodium: 146 mmol/L — ABNORMAL HIGH (ref 134–144)
Total Protein: 6.2 g/dL (ref 6.0–8.5)
eGFR: 99 mL/min/{1.73_m2}

## 2024-08-13 LAB — APOLIPOPROTEIN B: Apolipoprotein B: 83 mg/dL

## 2024-08-13 LAB — LIPID PANEL
Chol/HDL Ratio: 3.4 ratio (ref 0.0–4.4)
Cholesterol, Total: 160 mg/dL (ref 100–199)
HDL: 47 mg/dL
LDL Chol Calc (NIH): 78 mg/dL (ref 0–99)
Triglycerides: 209 mg/dL — ABNORMAL HIGH (ref 0–149)
VLDL Cholesterol Cal: 35 mg/dL (ref 5–40)

## 2024-08-14 ENCOUNTER — Other Ambulatory Visit: Payer: Self-pay

## 2024-09-17 ENCOUNTER — Other Ambulatory Visit (HOSPITAL_BASED_OUTPATIENT_CLINIC_OR_DEPARTMENT_OTHER): Admitting: Radiology

## 2024-09-17 ENCOUNTER — Other Ambulatory Visit

## 2024-09-18 ENCOUNTER — Ambulatory Visit: Admitting: Oncology

## 2024-10-01 ENCOUNTER — Other Ambulatory Visit (HOSPITAL_BASED_OUTPATIENT_CLINIC_OR_DEPARTMENT_OTHER): Admitting: Radiology
